# Patient Record
Sex: Female | Born: 1935 | Race: Black or African American | Hispanic: No | Smoking: Never smoker
Health system: Southern US, Community
[De-identification: ages and names within clinical notes are randomized; demographics above are authoritative.]

## PROBLEM LIST (undated history)

## (undated) DIAGNOSIS — F411 Generalized anxiety disorder: Secondary | ICD-10-CM

## (undated) DIAGNOSIS — M25569 Pain in unspecified knee: Secondary | ICD-10-CM

## (undated) DIAGNOSIS — M1710 Unilateral primary osteoarthritis, unspecified knee: Secondary | ICD-10-CM

## (undated) DIAGNOSIS — R634 Abnormal weight loss: Secondary | ICD-10-CM

## (undated) DIAGNOSIS — J309 Allergic rhinitis, unspecified: Secondary | ICD-10-CM

## (undated) DIAGNOSIS — M25469 Effusion, unspecified knee: Secondary | ICD-10-CM

## (undated) DIAGNOSIS — M79609 Pain in unspecified limb: Secondary | ICD-10-CM

## (undated) DIAGNOSIS — I10 Essential (primary) hypertension: Secondary | ICD-10-CM

## (undated) DIAGNOSIS — J13 Pneumonia due to Streptococcus pneumoniae: Secondary | ICD-10-CM

## (undated) DIAGNOSIS — K219 Gastro-esophageal reflux disease without esophagitis: Secondary | ICD-10-CM

## (undated) DIAGNOSIS — D649 Anemia, unspecified: Secondary | ICD-10-CM

## (undated) DIAGNOSIS — M545 Low back pain: Secondary | ICD-10-CM

## (undated) DIAGNOSIS — R1084 Generalized abdominal pain: Secondary | ICD-10-CM

## (undated) DIAGNOSIS — G629 Polyneuropathy, unspecified: Secondary | ICD-10-CM

## (undated) DIAGNOSIS — F0390 Unspecified dementia without behavioral disturbance: Principal | ICD-10-CM

## (undated) DIAGNOSIS — R21 Rash and other nonspecific skin eruption: Secondary | ICD-10-CM

## (undated) DIAGNOSIS — F329 Major depressive disorder, single episode, unspecified: Secondary | ICD-10-CM

## (undated) DIAGNOSIS — K5730 Diverticulosis of large intestine without perforation or abscess without bleeding: Secondary | ICD-10-CM

## (undated) DIAGNOSIS — Z853 Personal history of malignant neoplasm of breast: Secondary | ICD-10-CM

## (undated) DIAGNOSIS — Z86010 Personal history of colonic polyps: Secondary | ICD-10-CM

## (undated) DIAGNOSIS — I70219 Atherosclerosis of native arteries of extremities with intermittent claudication, unspecified extremity: Secondary | ICD-10-CM

## (undated) DIAGNOSIS — J45909 Unspecified asthma, uncomplicated: Secondary | ICD-10-CM

## (undated) DIAGNOSIS — E785 Hyperlipidemia, unspecified: Secondary | ICD-10-CM

## (undated) DIAGNOSIS — N959 Unspecified menopausal and perimenopausal disorder: Secondary | ICD-10-CM

## (undated) HISTORY — DX: Pain in unspecified knee: M25.569

## (undated) HISTORY — DX: Unilateral primary osteoarthritis, unspecified knee: M17.10

## (undated) HISTORY — DX: Generalized abdominal pain: R10.84

## (undated) HISTORY — DX: Effusion, unspecified knee: M25.469

## (undated) HISTORY — DX: Atherosclerosis of native arteries of extremities with intermittent claudication, unspecified extremity: I70.219

## (undated) HISTORY — PX: TUBAL LIGATION: SHX77

## (undated) HISTORY — DX: Unspecified asthma, uncomplicated: J45.909

## (undated) HISTORY — DX: Low back pain: M54.5

## (undated) HISTORY — DX: Gastro-esophageal reflux disease without esophagitis: K21.9

## (undated) HISTORY — DX: Pneumonia due to Streptococcus pneumoniae: J13

## (undated) HISTORY — DX: Essential (primary) hypertension: I10

## (undated) HISTORY — DX: Diverticulosis of large intestine without perforation or abscess without bleeding: K57.30

## (undated) HISTORY — DX: Anemia, unspecified: D64.9

## (undated) HISTORY — DX: Rash and other nonspecific skin eruption: R21

## (undated) HISTORY — DX: Polyneuropathy, unspecified: G62.9

## (undated) HISTORY — DX: Personal history of colonic polyps: Z86.010

## (undated) HISTORY — DX: Abnormal weight loss: R63.4

## (undated) HISTORY — DX: Major depressive disorder, single episode, unspecified: F32.9

## (undated) HISTORY — DX: Allergic rhinitis, unspecified: J30.9

## (undated) HISTORY — DX: Unspecified dementia without behavioral disturbance: F03.90

## (undated) HISTORY — DX: Pain in unspecified limb: M79.609

## (undated) HISTORY — DX: Generalized anxiety disorder: F41.1

## (undated) HISTORY — DX: Unspecified menopausal and perimenopausal disorder: N95.9

## (undated) HISTORY — DX: Hyperlipidemia, unspecified: E78.5

## (undated) HISTORY — DX: Personal history of malignant neoplasm of breast: Z85.3

---

## 1964-09-23 HISTORY — PX: ABDOMINAL HYSTERECTOMY: SHX81

## 1998-06-29 ENCOUNTER — Emergency Department (HOSPITAL_COMMUNITY): Admission: EM | Admit: 1998-06-29 | Discharge: 1998-06-30 | Payer: Self-pay | Admitting: Emergency Medicine

## 1998-07-08 ENCOUNTER — Emergency Department (HOSPITAL_COMMUNITY): Admission: EM | Admit: 1998-07-08 | Discharge: 1998-07-08 | Payer: Self-pay | Admitting: Emergency Medicine

## 2000-02-19 ENCOUNTER — Ambulatory Visit (HOSPITAL_COMMUNITY): Admission: RE | Admit: 2000-02-19 | Discharge: 2000-02-19 | Payer: Self-pay | Admitting: Internal Medicine

## 2000-02-19 ENCOUNTER — Encounter: Payer: Self-pay | Admitting: Internal Medicine

## 2001-01-17 ENCOUNTER — Encounter: Payer: Self-pay | Admitting: Emergency Medicine

## 2001-01-17 ENCOUNTER — Emergency Department (HOSPITAL_COMMUNITY): Admission: EM | Admit: 2001-01-17 | Discharge: 2001-01-17 | Payer: Self-pay | Admitting: Emergency Medicine

## 2001-09-30 LAB — HM COLONOSCOPY

## 2002-08-12 ENCOUNTER — Other Ambulatory Visit: Admission: RE | Admit: 2002-08-12 | Discharge: 2002-08-12 | Payer: Self-pay | Admitting: Surgery

## 2002-09-08 ENCOUNTER — Ambulatory Visit: Admission: RE | Admit: 2002-09-08 | Discharge: 2002-10-28 | Payer: Self-pay | Admitting: Radiation Oncology

## 2004-08-07 ENCOUNTER — Ambulatory Visit: Payer: Self-pay | Admitting: Internal Medicine

## 2004-08-21 ENCOUNTER — Ambulatory Visit: Payer: Self-pay | Admitting: Internal Medicine

## 2004-09-06 ENCOUNTER — Ambulatory Visit: Admission: RE | Admit: 2004-09-06 | Discharge: 2004-09-06 | Payer: Self-pay | Admitting: Radiation Oncology

## 2004-09-20 ENCOUNTER — Ambulatory Visit: Admission: RE | Admit: 2004-09-20 | Discharge: 2004-09-20 | Payer: Self-pay | Admitting: Radiation Oncology

## 2004-10-26 ENCOUNTER — Ambulatory Visit: Payer: Self-pay | Admitting: Oncology

## 2005-07-19 ENCOUNTER — Ambulatory Visit: Payer: Self-pay | Admitting: Internal Medicine

## 2005-11-07 ENCOUNTER — Ambulatory Visit: Payer: Self-pay | Admitting: Oncology

## 2006-03-25 ENCOUNTER — Ambulatory Visit: Payer: Self-pay | Admitting: Internal Medicine

## 2006-04-03 ENCOUNTER — Ambulatory Visit: Payer: Self-pay | Admitting: Internal Medicine

## 2006-06-27 ENCOUNTER — Ambulatory Visit: Payer: Self-pay | Admitting: Internal Medicine

## 2006-10-28 ENCOUNTER — Ambulatory Visit: Payer: Self-pay | Admitting: Oncology

## 2006-10-31 LAB — CBC WITH DIFFERENTIAL/PLATELET
BASO%: 0.1 % (ref 0.0–2.0)
Basophils Absolute: 0 10*3/uL (ref 0.0–0.1)
EOS%: 3.1 % (ref 0.0–7.0)
HGB: 11.4 g/dL — ABNORMAL LOW (ref 11.6–15.9)
MCH: 31.6 pg (ref 26.0–34.0)
MCHC: 35 g/dL (ref 32.0–36.0)
MCV: 90.3 fL (ref 81.0–101.0)
MONO%: 10.3 % (ref 0.0–13.0)
RBC: 3.62 10*6/uL — ABNORMAL LOW (ref 3.70–5.32)
RDW: 12.9 % (ref 11.3–14.5)
lymph#: 1.1 10*3/uL (ref 0.9–3.3)

## 2006-10-31 LAB — COMPREHENSIVE METABOLIC PANEL
ALT: 13 U/L (ref 0–35)
AST: 20 U/L (ref 0–37)
Albumin: 4.3 g/dL (ref 3.5–5.2)
Alkaline Phosphatase: 57 U/L (ref 39–117)
BUN: 25 mg/dL — ABNORMAL HIGH (ref 6–23)
Potassium: 4.2 mEq/L (ref 3.5–5.3)

## 2006-11-07 ENCOUNTER — Ambulatory Visit (HOSPITAL_COMMUNITY): Admission: RE | Admit: 2006-11-07 | Discharge: 2006-11-07 | Payer: Self-pay | Admitting: Oncology

## 2007-01-07 ENCOUNTER — Ambulatory Visit: Payer: Self-pay | Admitting: Internal Medicine

## 2007-04-10 ENCOUNTER — Ambulatory Visit: Payer: Self-pay | Admitting: Internal Medicine

## 2007-04-10 LAB — CONVERTED CEMR LAB
Alkaline Phosphatase: 62 units/L (ref 39–117)
Basophils Relative: 0.4 % (ref 0.0–1.0)
Bilirubin Urine: NEGATIVE
Bilirubin, Direct: 0.1 mg/dL (ref 0.0–0.3)
CO2: 29 meq/L (ref 19–32)
Creatinine, Ser: 1.2 mg/dL (ref 0.4–1.2)
Crystals: NEGATIVE
Eosinophils Absolute: 0.1 10*3/uL (ref 0.0–0.6)
Eosinophils Relative: 1.6 % (ref 0.0–5.0)
Glucose, Bld: 97 mg/dL (ref 70–99)
HDL: 66.8 mg/dL (ref >39.0)
Hemoglobin, Urine: NEGATIVE
Hemoglobin: 12 g/dL (ref 12.0–15.0)
Lymphocytes Relative: 17.9 % (ref 12.0–46.0)
MCV: 90.7 fL (ref 78.0–100.0)
Monocytes Absolute: 0.8 10*3/uL — ABNORMAL HIGH (ref 0.2–0.7)
Monocytes Relative: 12.1 % — ABNORMAL HIGH (ref 3.0–11.0)
Mucus, UA: NEGATIVE
Neutro Abs: 4.3 10*3/uL (ref 1.4–7.7)
Nitrite: NEGATIVE
Potassium: 4.6 meq/L (ref 3.5–5.1)
Sodium: 134 meq/L — ABNORMAL LOW (ref 135–145)
TSH: 1.1 microintl units/mL (ref 0.35–5.50)
Total Bilirubin: 1.1 mg/dL (ref 0.3–1.2)
Total Protein: 7.5 g/dL (ref 6.0–8.3)
Triglycerides: 46 mg/dL (ref 0–149)
Urine Glucose: NEGATIVE mg/dL
Urobilinogen, UA: 0.2 (ref 0.0–1.0)
VLDL: 9 mg/dL (ref 0–40)

## 2007-04-14 ENCOUNTER — Ambulatory Visit: Payer: Self-pay | Admitting: Internal Medicine

## 2007-04-16 DIAGNOSIS — M545 Low back pain, unspecified: Secondary | ICD-10-CM

## 2007-04-16 DIAGNOSIS — Z853 Personal history of malignant neoplasm of breast: Secondary | ICD-10-CM

## 2007-04-16 DIAGNOSIS — D62 Acute posthemorrhagic anemia: Secondary | ICD-10-CM

## 2007-04-16 DIAGNOSIS — F411 Generalized anxiety disorder: Secondary | ICD-10-CM

## 2007-04-16 DIAGNOSIS — D649 Anemia, unspecified: Secondary | ICD-10-CM

## 2007-04-16 DIAGNOSIS — K219 Gastro-esophageal reflux disease without esophagitis: Secondary | ICD-10-CM | POA: Insufficient documentation

## 2007-04-16 DIAGNOSIS — E785 Hyperlipidemia, unspecified: Secondary | ICD-10-CM

## 2007-04-16 DIAGNOSIS — I10 Essential (primary) hypertension: Secondary | ICD-10-CM

## 2007-04-16 HISTORY — DX: Anemia, unspecified: D64.9

## 2007-04-16 HISTORY — DX: Personal history of malignant neoplasm of breast: Z85.3

## 2007-04-16 HISTORY — DX: Low back pain, unspecified: M54.50

## 2007-04-16 HISTORY — DX: Gastro-esophageal reflux disease without esophagitis: K21.9

## 2007-04-16 HISTORY — DX: Hyperlipidemia, unspecified: E78.5

## 2007-04-16 HISTORY — DX: Generalized anxiety disorder: F41.1

## 2007-04-16 HISTORY — DX: Essential (primary) hypertension: I10

## 2007-05-13 ENCOUNTER — Ambulatory Visit: Payer: Self-pay | Admitting: Oncology

## 2007-05-15 LAB — CBC WITH DIFFERENTIAL/PLATELET
Basophils Absolute: 0 10*3/uL (ref 0.0–0.1)
Eosinophils Absolute: 0.2 10*3/uL (ref 0.0–0.5)
HGB: 11.4 g/dL — ABNORMAL LOW (ref 11.6–15.9)
LYMPH%: 20.1 % (ref 14.0–48.0)
MCH: 31.6 pg (ref 26.0–34.0)
MCV: 90.2 fL (ref 81.0–101.0)
MONO%: 10.3 % (ref 0.0–13.0)
NEUT#: 3.8 10*3/uL (ref 1.5–6.5)
Platelets: 250 10*3/uL (ref 145–400)
RBC: 3.61 10*6/uL — ABNORMAL LOW (ref 3.70–5.32)

## 2007-05-16 LAB — IRON AND TIBC
%SAT: 18 % — ABNORMAL LOW (ref 20–55)
TIBC: 309 ug/dL (ref 250–470)
UIBC: 254 ug/dL

## 2007-05-16 LAB — COMPREHENSIVE METABOLIC PANEL
Alkaline Phosphatase: 52 U/L (ref 39–117)
BUN: 19 mg/dL (ref 6–23)
Glucose, Bld: 89 mg/dL (ref 70–99)
Total Bilirubin: 0.6 mg/dL (ref 0.3–1.2)

## 2007-06-27 ENCOUNTER — Ambulatory Visit: Payer: Self-pay | Admitting: Internal Medicine

## 2007-08-25 ENCOUNTER — Encounter: Payer: Self-pay | Admitting: Internal Medicine

## 2007-09-03 ENCOUNTER — Ambulatory Visit: Payer: Self-pay | Admitting: Internal Medicine

## 2007-09-03 DIAGNOSIS — Z860100 Personal history of colon polyps, unspecified: Secondary | ICD-10-CM | POA: Insufficient documentation

## 2007-09-03 DIAGNOSIS — F3289 Other specified depressive episodes: Secondary | ICD-10-CM

## 2007-09-03 DIAGNOSIS — R1084 Generalized abdominal pain: Secondary | ICD-10-CM

## 2007-09-03 DIAGNOSIS — J45909 Unspecified asthma, uncomplicated: Secondary | ICD-10-CM

## 2007-09-03 DIAGNOSIS — K5730 Diverticulosis of large intestine without perforation or abscess without bleeding: Secondary | ICD-10-CM | POA: Insufficient documentation

## 2007-09-03 DIAGNOSIS — F329 Major depressive disorder, single episode, unspecified: Secondary | ICD-10-CM

## 2007-09-03 HISTORY — DX: Other specified depressive episodes: F32.89

## 2007-09-03 HISTORY — DX: Diverticulosis of large intestine without perforation or abscess without bleeding: K57.30

## 2007-09-03 HISTORY — DX: Personal history of colonic polyps: Z86.010

## 2007-09-03 HISTORY — DX: Unspecified asthma, uncomplicated: J45.909

## 2007-09-03 HISTORY — DX: Generalized abdominal pain: R10.84

## 2007-09-03 HISTORY — DX: Major depressive disorder, single episode, unspecified: F32.9

## 2007-12-30 ENCOUNTER — Ambulatory Visit: Payer: Self-pay | Admitting: Internal Medicine

## 2008-01-05 LAB — CONVERTED CEMR LAB
Alkaline Phosphatase: 49 units/L (ref 39–117)
Bilirubin Urine: NEGATIVE
Bilirubin, Direct: 0.1 mg/dL (ref 0.0–0.3)
Eosinophils Absolute: 0.2 10*3/uL (ref 0.0–0.7)
GFR calc Af Amer: 55 mL/min
GFR calc non Af Amer: 45 mL/min
Glucose, Bld: 97 mg/dL (ref 70–99)
HCT: 33.7 % — ABNORMAL LOW (ref 36.0–46.0)
Lipase: 40 units/L (ref 11.0–59.0)
Monocytes Absolute: 0.7 10*3/uL (ref 0.1–1.0)
Monocytes Relative: 12.7 % — ABNORMAL HIGH (ref 3.0–12.0)
Nitrite: NEGATIVE
Platelets: 240 10*3/uL (ref 150–400)
Potassium: 4.8 meq/L (ref 3.5–5.1)
RDW: 12.2 % (ref 11.5–14.6)
Sodium: 139 meq/L (ref 135–145)
Total Bilirubin: 0.5 mg/dL (ref 0.3–1.2)
Total Protein, Urine: NEGATIVE mg/dL

## 2008-01-07 ENCOUNTER — Encounter: Admission: RE | Admit: 2008-01-07 | Discharge: 2008-01-07 | Payer: Self-pay | Admitting: Internal Medicine

## 2008-03-22 ENCOUNTER — Emergency Department (HOSPITAL_COMMUNITY): Admission: EM | Admit: 2008-03-22 | Discharge: 2008-03-22 | Payer: Self-pay | Admitting: Emergency Medicine

## 2008-04-18 ENCOUNTER — Ambulatory Visit: Payer: Self-pay | Admitting: Internal Medicine

## 2008-04-18 DIAGNOSIS — N959 Unspecified menopausal and perimenopausal disorder: Secondary | ICD-10-CM

## 2008-04-18 HISTORY — DX: Unspecified menopausal and perimenopausal disorder: N95.9

## 2008-04-20 ENCOUNTER — Encounter: Payer: Self-pay | Admitting: Internal Medicine

## 2008-04-20 ENCOUNTER — Ambulatory Visit: Payer: Self-pay | Admitting: Internal Medicine

## 2008-04-25 ENCOUNTER — Telehealth (INDEPENDENT_AMBULATORY_CARE_PROVIDER_SITE_OTHER): Payer: Self-pay | Admitting: *Deleted

## 2008-05-18 ENCOUNTER — Ambulatory Visit: Payer: Self-pay | Admitting: Oncology

## 2008-05-20 ENCOUNTER — Encounter: Payer: Self-pay | Admitting: Internal Medicine

## 2008-06-06 ENCOUNTER — Encounter: Payer: Self-pay | Admitting: Internal Medicine

## 2008-07-05 ENCOUNTER — Ambulatory Visit: Payer: Self-pay | Admitting: Internal Medicine

## 2008-07-25 ENCOUNTER — Ambulatory Visit: Payer: Self-pay | Admitting: Internal Medicine

## 2008-07-25 DIAGNOSIS — R21 Rash and other nonspecific skin eruption: Secondary | ICD-10-CM

## 2008-07-25 DIAGNOSIS — R634 Abnormal weight loss: Secondary | ICD-10-CM | POA: Insufficient documentation

## 2008-07-25 HISTORY — DX: Rash and other nonspecific skin eruption: R21

## 2008-07-25 HISTORY — DX: Abnormal weight loss: R63.4

## 2008-08-16 ENCOUNTER — Telehealth (INDEPENDENT_AMBULATORY_CARE_PROVIDER_SITE_OTHER): Payer: Self-pay | Admitting: *Deleted

## 2008-08-30 ENCOUNTER — Encounter: Payer: Self-pay | Admitting: Internal Medicine

## 2008-11-22 ENCOUNTER — Ambulatory Visit: Payer: Self-pay | Admitting: Internal Medicine

## 2008-11-22 LAB — CONVERTED CEMR LAB
AST: 27 units/L (ref 0–37)
Albumin: 3.9 g/dL (ref 3.5–5.2)
Basophils Absolute: 0.1 10*3/uL (ref 0.0–0.1)
Basophils Relative: 0.8 % (ref 0.0–3.0)
Calcium: 9.3 mg/dL (ref 8.4–10.5)
Chloride: 101 meq/L (ref 96–112)
Cholesterol: 207 mg/dL (ref 0–200)
Creatinine, Ser: 1 mg/dL (ref 0.4–1.2)
Crystals: NEGATIVE
Direct LDL: 125.9 mg/dL
Eosinophils Absolute: 0.1 10*3/uL (ref 0.0–0.7)
GFR calc Af Amer: 67 mL/min
GFR calc non Af Amer: 56 mL/min
HCT: 34.7 % — ABNORMAL LOW (ref 36.0–46.0)
HDL: 62.2 mg/dL (ref >39.0)
Ketones, ur: NEGATIVE mg/dL
MCHC: 33.8 g/dL (ref 30.0–36.0)
MCV: 90.5 fL (ref 78.0–100.0)
Monocytes Absolute: 0.6 10*3/uL (ref 0.1–1.0)
Neutrophils Relative %: 76.9 % (ref 43.0–77.0)
Platelets: 216 10*3/uL (ref 150–400)
RBC: 3.84 M/uL — ABNORMAL LOW (ref 3.87–5.11)
TSH: 1.55 microintl units/mL (ref 0.35–5.50)
Total Bilirubin: 0.9 mg/dL (ref 0.3–1.2)
Urine Glucose: NEGATIVE mg/dL
Urobilinogen, UA: 0.2 (ref 0.0–1.0)
VLDL: 10 mg/dL (ref 0–40)

## 2008-11-26 ENCOUNTER — Telehealth: Payer: Self-pay | Admitting: Internal Medicine

## 2008-11-27 ENCOUNTER — Inpatient Hospital Stay (HOSPITAL_COMMUNITY): Admission: EM | Admit: 2008-11-27 | Discharge: 2008-11-29 | Payer: Self-pay | Admitting: Emergency Medicine

## 2008-11-27 ENCOUNTER — Ambulatory Visit: Payer: Self-pay | Admitting: Internal Medicine

## 2008-11-30 ENCOUNTER — Telehealth: Payer: Self-pay | Admitting: Internal Medicine

## 2008-12-07 ENCOUNTER — Ambulatory Visit: Payer: Self-pay | Admitting: Internal Medicine

## 2008-12-07 DIAGNOSIS — J13 Pneumonia due to Streptococcus pneumoniae: Secondary | ICD-10-CM

## 2008-12-07 HISTORY — DX: Pneumonia due to Streptococcus pneumoniae: J13

## 2008-12-12 ENCOUNTER — Encounter: Payer: Self-pay | Admitting: Internal Medicine

## 2008-12-14 ENCOUNTER — Encounter: Payer: Self-pay | Admitting: Internal Medicine

## 2008-12-15 ENCOUNTER — Ambulatory Visit: Payer: Self-pay | Admitting: Internal Medicine

## 2008-12-16 ENCOUNTER — Encounter: Payer: Self-pay | Admitting: Internal Medicine

## 2009-01-04 ENCOUNTER — Ambulatory Visit: Payer: Self-pay | Admitting: Internal Medicine

## 2009-01-04 LAB — CONVERTED CEMR LAB
Basophils Relative: 1 % (ref 0.0–3.0)
Eosinophils Relative: 6.5 % — ABNORMAL HIGH (ref 0.0–5.0)
Hemoglobin: 11.1 g/dL — ABNORMAL LOW (ref 12.0–15.0)
Lymphocytes Relative: 22.3 % (ref 12.0–46.0)
Monocytes Relative: 10.6 % (ref 3.0–12.0)
Neutro Abs: 3.3 10*3/uL (ref 1.4–7.7)
Neutrophils Relative %: 59.6 % (ref 43.0–77.0)
RBC: 3.54 M/uL — ABNORMAL LOW (ref 3.87–5.11)
Saturation Ratios: 15.2 % — ABNORMAL LOW (ref 20.0–50.0)
Transferrin: 216.4 mg/dL (ref 212.0–360.0)
Vitamin B-12: 411 pg/mL (ref 211–911)
WBC: 5.7 10*3/uL (ref 4.5–10.5)

## 2009-02-16 ENCOUNTER — Encounter (INDEPENDENT_AMBULATORY_CARE_PROVIDER_SITE_OTHER): Payer: Self-pay | Admitting: *Deleted

## 2009-02-27 ENCOUNTER — Ambulatory Visit: Payer: Self-pay | Admitting: Internal Medicine

## 2009-02-27 DIAGNOSIS — M25469 Effusion, unspecified knee: Secondary | ICD-10-CM

## 2009-02-27 DIAGNOSIS — J309 Allergic rhinitis, unspecified: Secondary | ICD-10-CM

## 2009-02-27 HISTORY — DX: Effusion, unspecified knee: M25.469

## 2009-02-27 HISTORY — DX: Allergic rhinitis, unspecified: J30.9

## 2009-04-04 ENCOUNTER — Telehealth: Payer: Self-pay | Admitting: Internal Medicine

## 2009-06-02 ENCOUNTER — Ambulatory Visit: Payer: Self-pay | Admitting: Oncology

## 2009-06-06 ENCOUNTER — Encounter: Payer: Self-pay | Admitting: Internal Medicine

## 2009-06-06 LAB — CBC WITH DIFFERENTIAL/PLATELET
BASO%: 0.6 % (ref 0.0–2.0)
LYMPH%: 22.6 % (ref 14.0–49.7)
MCHC: 34.7 g/dL (ref 31.5–36.0)
MCV: 89.6 fL (ref 79.5–101.0)
MONO%: 10.9 % (ref 0.0–14.0)
NEUT#: 2.9 10*3/uL (ref 1.5–6.5)
Platelets: 216 10*3/uL (ref 145–400)
RBC: 3.88 10*6/uL (ref 3.70–5.45)
RDW: 13.1 % (ref 11.2–14.5)
WBC: 4.7 10*3/uL (ref 3.9–10.3)

## 2009-06-06 LAB — COMPREHENSIVE METABOLIC PANEL
ALT: 13 U/L (ref 0–35)
AST: 21 U/L (ref 0–37)
Albumin: 4.3 g/dL (ref 3.5–5.2)
Alkaline Phosphatase: 50 U/L (ref 39–117)
Potassium: 4.7 mEq/L (ref 3.5–5.3)
Sodium: 132 mEq/L — ABNORMAL LOW (ref 135–145)
Total Bilirubin: 0.4 mg/dL (ref 0.3–1.2)
Total Protein: 7.7 g/dL (ref 6.0–8.3)

## 2009-06-21 ENCOUNTER — Ambulatory Visit: Payer: Self-pay | Admitting: Internal Medicine

## 2009-07-17 ENCOUNTER — Encounter: Payer: Self-pay | Admitting: Internal Medicine

## 2009-07-24 ENCOUNTER — Encounter: Payer: Self-pay | Admitting: Internal Medicine

## 2009-07-25 ENCOUNTER — Encounter: Payer: Self-pay | Admitting: Internal Medicine

## 2009-07-27 ENCOUNTER — Encounter: Payer: Self-pay | Admitting: Internal Medicine

## 2009-07-31 ENCOUNTER — Telehealth: Payer: Self-pay | Admitting: Internal Medicine

## 2009-08-04 ENCOUNTER — Telehealth: Payer: Self-pay | Admitting: Internal Medicine

## 2009-08-04 ENCOUNTER — Encounter: Payer: Self-pay | Admitting: Internal Medicine

## 2009-08-08 ENCOUNTER — Telehealth (INDEPENDENT_AMBULATORY_CARE_PROVIDER_SITE_OTHER): Payer: Self-pay | Admitting: *Deleted

## 2009-08-09 ENCOUNTER — Ambulatory Visit: Payer: Self-pay

## 2009-08-09 ENCOUNTER — Encounter (HOSPITAL_COMMUNITY): Admission: RE | Admit: 2009-08-09 | Discharge: 2009-09-19 | Payer: Self-pay | Admitting: Internal Medicine

## 2009-08-09 ENCOUNTER — Ambulatory Visit: Payer: Self-pay | Admitting: Cardiology

## 2009-08-15 ENCOUNTER — Ambulatory Visit: Payer: Self-pay | Admitting: Oncology

## 2009-08-21 ENCOUNTER — Encounter: Payer: Self-pay | Admitting: Internal Medicine

## 2009-08-22 ENCOUNTER — Telehealth (INDEPENDENT_AMBULATORY_CARE_PROVIDER_SITE_OTHER): Payer: Self-pay | Admitting: *Deleted

## 2009-08-25 ENCOUNTER — Ambulatory Visit: Payer: Self-pay | Admitting: Internal Medicine

## 2009-08-27 LAB — CONVERTED CEMR LAB
ALT: 15 units/L (ref 0–35)
AST: 27 units/L (ref 0–37)
Alkaline Phosphatase: 50 units/L (ref 39–117)
BUN: 12 mg/dL (ref 6–23)
Basophils Relative: 0 % (ref 0.0–3.0)
Chloride: 103 meq/L (ref 96–112)
Eosinophils Absolute: 0.1 10*3/uL (ref 0.0–0.7)
Eosinophils Relative: 1.9 % (ref 0.0–5.0)
GFR calc non Af Amer: 75.9 mL/min (ref >60)
Ketones, ur: NEGATIVE mg/dL
Lymphocytes Relative: 24.6 % (ref 12.0–46.0)
MCHC: 34.2 g/dL (ref 30.0–36.0)
MCV: 93.4 fL (ref 78.0–100.0)
Monocytes Absolute: 0.5 10*3/uL (ref 0.1–1.0)
Neutrophils Relative %: 63.7 % (ref 43.0–77.0)
Platelets: 206 10*3/uL (ref 150.0–400.0)
Potassium: 4.2 meq/L (ref 3.5–5.1)
RBC: 3.65 M/uL — ABNORMAL LOW (ref 3.87–5.11)
Sodium: 140 meq/L (ref 135–145)
Specific Gravity, Urine: <=1.005 (ref 1.000–1.030)
Total Bilirubin: 0.5 mg/dL (ref 0.3–1.2)
Total Protein, Urine: NEGATIVE mg/dL
Urine Glucose: NEGATIVE mg/dL
WBC: 5 10*3/uL (ref 4.5–10.5)
pH: 6 (ref 5.0–8.0)

## 2009-08-29 ENCOUNTER — Encounter (INDEPENDENT_AMBULATORY_CARE_PROVIDER_SITE_OTHER): Payer: Self-pay | Admitting: Surgery

## 2009-08-29 ENCOUNTER — Ambulatory Visit (HOSPITAL_COMMUNITY): Admission: RE | Admit: 2009-08-29 | Discharge: 2009-08-30 | Payer: Self-pay | Admitting: Surgery

## 2009-09-04 ENCOUNTER — Encounter: Payer: Self-pay | Admitting: Internal Medicine

## 2009-09-12 ENCOUNTER — Encounter: Payer: Self-pay | Admitting: Internal Medicine

## 2009-09-23 HISTORY — PX: OTHER SURGICAL HISTORY: SHX169

## 2009-10-18 ENCOUNTER — Encounter: Payer: Self-pay | Admitting: Internal Medicine

## 2009-11-24 ENCOUNTER — Ambulatory Visit: Payer: Self-pay | Admitting: Internal Medicine

## 2009-11-24 DIAGNOSIS — IMO0002 Reserved for concepts with insufficient information to code with codable children: Secondary | ICD-10-CM

## 2009-11-24 HISTORY — DX: Reserved for concepts with insufficient information to code with codable children: IMO0002

## 2009-12-11 ENCOUNTER — Telehealth: Payer: Self-pay | Admitting: Internal Medicine

## 2010-01-04 ENCOUNTER — Ambulatory Visit: Payer: Self-pay | Admitting: Oncology

## 2010-01-08 ENCOUNTER — Encounter: Payer: Self-pay | Admitting: Internal Medicine

## 2010-01-08 LAB — COMPREHENSIVE METABOLIC PANEL
AST: 23 U/L (ref 0–37)
Albumin: 4.6 g/dL (ref 3.5–5.2)
BUN: 11 mg/dL (ref 6–23)
CO2: 24 mEq/L (ref 19–32)
Calcium: 9.5 mg/dL (ref 8.4–10.5)
Chloride: 102 mEq/L (ref 96–112)
Glucose, Bld: 96 mg/dL (ref 70–99)
Potassium: 3.8 mEq/L (ref 3.5–5.3)

## 2010-01-08 LAB — CBC WITH DIFFERENTIAL/PLATELET
Basophils Absolute: 0 10*3/uL (ref 0.0–0.1)
EOS%: 1.4 % (ref 0.0–7.0)
Eosinophils Absolute: 0.1 10*3/uL (ref 0.0–0.5)
HCT: 35.9 % (ref 34.8–46.6)
HGB: 12.1 g/dL (ref 11.6–15.9)
MONO#: 0.4 10*3/uL (ref 0.1–0.9)
NEUT#: 3.3 10*3/uL (ref 1.5–6.5)
RDW: 13.7 % (ref 11.2–14.5)
WBC: 4.8 10*3/uL (ref 3.9–10.3)
lymph#: 1.1 10*3/uL (ref 0.9–3.3)

## 2010-01-08 LAB — LACTATE DEHYDROGENASE: LDH: 234 U/L (ref 94–250)

## 2010-04-16 ENCOUNTER — Telehealth: Payer: Self-pay | Admitting: Internal Medicine

## 2010-06-01 ENCOUNTER — Ambulatory Visit: Payer: Self-pay | Admitting: Internal Medicine

## 2010-06-01 DIAGNOSIS — M25569 Pain in unspecified knee: Secondary | ICD-10-CM

## 2010-06-01 DIAGNOSIS — M79609 Pain in unspecified limb: Secondary | ICD-10-CM

## 2010-06-01 HISTORY — DX: Pain in unspecified knee: M25.569

## 2010-06-01 HISTORY — DX: Pain in unspecified limb: M79.609

## 2010-06-05 ENCOUNTER — Encounter: Payer: Self-pay | Admitting: Internal Medicine

## 2010-06-05 DIAGNOSIS — I70219 Atherosclerosis of native arteries of extremities with intermittent claudication, unspecified extremity: Secondary | ICD-10-CM | POA: Insufficient documentation

## 2010-06-05 HISTORY — DX: Atherosclerosis of native arteries of extremities with intermittent claudication, unspecified extremity: I70.219

## 2010-07-18 ENCOUNTER — Ambulatory Visit: Payer: Self-pay | Admitting: Oncology

## 2010-07-20 ENCOUNTER — Encounter: Payer: Self-pay | Admitting: Internal Medicine

## 2010-07-20 LAB — CBC WITH DIFFERENTIAL/PLATELET
Basophils Absolute: 0 10*3/uL (ref 0.0–0.1)
HCT: 32.1 % — ABNORMAL LOW (ref 34.8–46.6)
HGB: 10.8 g/dL — ABNORMAL LOW (ref 11.6–15.9)
LYMPH%: 33.3 % (ref 14.0–49.7)
MCH: 30.4 pg (ref 25.1–34.0)
MONO#: 0.5 10*3/uL (ref 0.1–0.9)
NEUT%: 53.9 % (ref 38.4–76.8)
Platelets: 186 10*3/uL (ref 145–400)
WBC: 5.2 10*3/uL (ref 3.9–10.3)
lymph#: 1.7 10*3/uL (ref 0.9–3.3)

## 2010-07-20 LAB — COMPREHENSIVE METABOLIC PANEL
Albumin: 4.3 g/dL (ref 3.5–5.2)
BUN: 15 mg/dL (ref 6–23)
Calcium: 9.7 mg/dL (ref 8.4–10.5)
Chloride: 99 mEq/L (ref 96–112)
Creatinine, Ser: 1 mg/dL (ref 0.40–1.20)
Glucose, Bld: 98 mg/dL (ref 70–99)
Potassium: 4.8 mEq/L (ref 3.5–5.3)

## 2010-07-30 ENCOUNTER — Encounter: Payer: Self-pay | Admitting: Internal Medicine

## 2010-07-30 ENCOUNTER — Ambulatory Visit: Payer: Self-pay | Admitting: Internal Medicine

## 2010-10-25 NOTE — Miscellaneous (Signed)
Summary: Orders Update  Clinical Lists Changes  Problems: Added new problem of ATHEROSLERO NATV ART EXTREM W/INTERMIT CLAUDICAT (ICD-440.21) Orders: Added new Test order of Arterial Duplex Lower Extremity (Arterial Duplex Low) - Signed 

## 2010-10-25 NOTE — Assessment & Plan Note (Signed)
Summary: 6 MO ROV AND BP CK/NWS  #   Vital Signs:  Patient profile:   77 year old female Height:      60 inches Weight:      91.50 pounds BMI:     17.93 O2 Sat:      98 % on Room air Temp:     98.5 degrees F oral Pulse rate:   72 / minute BP sitting:   132 / 50  (left arm) Cuff size:   regular  Vitals Entered By: Zella Ball Ewing CMA (AAMA) (June 01, 2010 1:30 PM)  O2 Flow:  Room air CC: 6 Month ROV/RE   CC:  6 Month ROV/RE.  History of Present Illness: here to f/u - overall doing well; Pt denies CP, worsening sob, doe, wheezing, orthopnea, pnd, worsening LE edema, palps, dizziness or syncope Pt denies new neuro symptoms such as headache, facial or extremity weakness   No fever, wt loss, night sweats, loss of appetite or other constitutional symptoms ,  But does have singificant bilat knee pain left mor then ritht, but not unstable, no recent falls, but needs pain med refills as she is out.  Also c/o recurrent left calf pain with excericse on occcasion for the past several months, but not clear if related to the knee pain.  No lower back pain, bowel or bladdr change,  Overall anxiety well controlled as well.  No worsening depression, or suicidal ideation.  No Panic.    Problems Prior to Update: 1)  Leg Pain, Left  (ICD-729.5) 2)  Knee Pain, Bilateral  (ICD-719.46) 3)  Osteoarthritis, Knee, Left  (ICD-715.96) 4)  Preoperative Examination  (ICD-V72.84) 5)  Preoperative Examination  (ICD-V72.84) 6)  Allergic Rhinitis  (ICD-477.9) 7)  Joint Effusion, Left Knee  (ICD-719.06) 8)  Pneumonia, Community Acquired, Pneumococcal  (ICD-481) 9)  Preventive Health Care  (ICD-V70.0) 10)  Weight Loss  (ICD-783.21) 11)  Rash-nonvesicular  (ICD-782.1) 12)  Menopausal Disorder  (ICD-627.9) 13)  Preventive Health Care  (ICD-V70.0) 14)  Family History Diabetes 1st Degree Relative  (ICD-V18.0) 15)  Colonic Polyps, Hx of  (ICD-V12.72) 16)  Diverticulosis, Colon  (ICD-562.10) 17)  Asthma   (ICD-493.90) 18)  Depression  (ICD-311) 19)  Abdominal Pain, Generalized  (ICD-789.07) 20)  Low Back Pain  (ICD-724.2) 21)  Hypertension  (ICD-401.9) 22)  Hyperlipidemia  (ICD-272.4) 23)  Gerd  (ICD-530.81) 24)  Breast Cancer, Hx of  (ICD-V10.3) 25)  Anxiety  (ICD-300.00) 26)  Anemia-nos  (ICD-285.9)  Medications Prior to Update: 1)  Ventolin Hfa 108 (90 Base) Mcg/act Aers (Albuterol Sulfate) .... 2 Puffs Qid As Needed 2)  Alprazolam 0.25 Mg Tabs (Alprazolam) .... Take 1 Tablet By Mouth Twice A Day As Needed 3)  Citalopram Hydrobromide 10 Mg Tabs (Citalopram Hydrobromide) .Marland Kitchen.. 1 Tablet By Mouth Once A Day 4)  Pravachol 20 Mg Tabs (Pravastatin Sodium) .... Take 1 Tablet By Mouth Once A Day 5)  Triamcinolone Acetonide 0.5 % Crea (Triamcinolone Acetonide) .... Apply 1 A Small Amount To Affected Area Twice A Day 6)  Benazepril Hcl 20 Mg Tabs (Benazepril Hcl) .Marland Kitchen.. 1po Once Daily 7)  Polyethylene Glycol 3350  Oral Powd (Polyethylene Glycol 3350) .... Use As Directed 8)  Ecotrin Low Strength 81 Mg  Tbec (Aspirin) .Marland Kitchen.. 1 By Mouth Qd 9)  Metronidazole 250 Mg  Tabs (Metronidazole) .Marland Kitchen.. 1 By Mouth Qid For 10 Days Only 10)  Alendronate Sodium 70 Mg  Tabs (Alendronate Sodium) .Marland Kitchen.. 1 By Mouth Each Week 11)  Omeprazole 20 Mg Cpdr (Omeprazole) .Marland Kitchen.. 1 By Mouth Two Times A Day 12)  Tylenol With Codeine #3 300-30 Mg Tabs (Acetaminophen-Codeine) .Marland Kitchen.. 1 By Mouth Q 6 Hrs As Needed Pain  Current Medications (verified): 1)  Ventolin Hfa 108 (90 Base) Mcg/act Aers (Albuterol Sulfate) .... 2 Puffs Qid As Needed 2)  Alprazolam 0.5 Mg Tabs (Alprazolam) .Marland Kitchen.. 1 By Mouth Two Times A Day As Needed 3)  Pravachol 20 Mg Tabs (Pravastatin Sodium) .... Take 1 Tablet By Mouth Once A Day 4)  Triamcinolone Acetonide 0.5 % Crea (Triamcinolone Acetonide) .... Apply 1 A Small Amount To Affected Area Twice A Day 5)  Benazepril Hcl 20 Mg Tabs (Benazepril Hcl) .Marland Kitchen.. 1po Once Daily 6)  Polyethylene Glycol 3350  Oral Powd  (Polyethylene Glycol 3350) .... Use As Directed 7)  Ecotrin Low Strength 81 Mg  Tbec (Aspirin) .Marland Kitchen.. 1 By Mouth Qd 8)  Alendronate Sodium 70 Mg  Tabs (Alendronate Sodium) .Marland Kitchen.. 1 By Mouth Each Week 9)  Omeprazole 20 Mg Cpdr (Omeprazole) .Marland Kitchen.. 1 By Mouth Two Times A Day 10)  Tylenol With Codeine #3 300-30 Mg Tabs (Acetaminophen-Codeine) .Marland Kitchen.. 1 By Mouth Q 6 Hrs As Needed Pain  Allergies (verified): 1)  ! Pcn 2)  ! Sulfa 3)  ! Norvasc  Past History:  Past Medical History: Last updated: 02/27/2009 Anemia-NOS Anxiety Breast cancer, hx of(Stage 1 ER positive-2003) GERD Hyperlipidemia Hypertension Low back pain Depression Asthma Diverticulosis, colon Colonic polyps, hx of Allergic rhinitis  Past Surgical History: Last updated: 09/03/2007 Hysterectomy-1966 Tubal ligation  Social History: Last updated: 11/24/2009 Never Smoked Alcohol use-no Drug use-no Retired  Risk Factors: Smoking Status: never (09/03/2007)  Review of Systems       all otherwise negative per pt -    Physical Exam  General:  alert and well-developed.   Head:  normocephalic and atraumatic.   Eyes:  vision grossly intact, pupils equal, and pupils round.   Ears:  R ear normal and L ear normal.   Nose:  no external deformity and no nasal discharge.   Mouth:  no gingival abnormalities.   Neck:  supple and no masses.   Lungs:  normal respiratory effort and normal breath sounds.   Heart:  normal rate and regular rhythm.   Msk:  bialt knee effusions, small left > right with crepitus and bony arthritc changes Pulses:  trace - 1+ dorsalis pedis bialt Extremities:  no edema, no erythema   calf nontender Neurologic:  cranial nerves II-XII intact and strength normal in all extremities.     Impression & Recommendations:  Problem # 1:  KNEE PAIN, BILATERAL (ICD-719.46)  Her updated medication list for this problem includes:    Ecotrin Low Strength 81 Mg Tbec (Aspirin) .Marland Kitchen... 1 by mouth qd    Tylenol With  Codeine #3 300-30 Mg Tabs (Acetaminophen-codeine) .Marland Kitchen... 1 by mouth q 6 hrs as needed pain left > right  - Continue all previous medications as before this visit   Problem # 2:  LEG PAIN, LEFT (ICD-729.5)  with excercise to calf   - to check LE art dopplers  Orders: Misc. Referral (Misc. Ref)  Problem # 3:  ANXIETY (ICD-300.00)  The following medications were removed from the medication list:    Citalopram Hydrobromide 10 Mg Tabs (Citalopram hydrobromide) .Marland Kitchen... 1 tablet by mouth once a day Her updated medication list for this problem includes:    Alprazolam 0.5 Mg Tabs (Alprazolam) .Marland Kitchen... 1 by mouth two times a day as needed stable  overall by hx and exam, ok to continue meds/tx as is   Problem # 4:  HYPERTENSION (ICD-401.9)  Her updated medication list for this problem includes:    Benazepril Hcl 20 Mg Tabs (Benazepril hcl) .Marland Kitchen... 1po once daily  BP today: 132/50 Prior BP: 150/70 (11/24/2009)  Labs Reviewed: K+: 4.2 (08/25/2009) Creat: : 0.9 (08/25/2009)   Chol: 207 (11/22/2008)   HDL: 62.2 (11/22/2008)   LDL: DEL (11/22/2008)   TG: 51 (11/22/2008) stable overall by hx and exam, ok to continue meds/tx as is   Complete Medication List: 1)  Ventolin Hfa 108 (90 Base) Mcg/act Aers (Albuterol sulfate) .... 2 puffs qid as needed 2)  Alprazolam 0.5 Mg Tabs (Alprazolam) .Marland Kitchen.. 1 by mouth two times a day as needed 3)  Pravachol 20 Mg Tabs (Pravastatin sodium) .... Take 1 tablet by mouth once a day 4)  Triamcinolone Acetonide 0.5 % Crea (Triamcinolone acetonide) .... Apply 1 a small amount to affected area twice a day 5)  Benazepril Hcl 20 Mg Tabs (Benazepril hcl) .Marland Kitchen.. 1po once daily 6)  Polyethylene Glycol 3350 Oral Powd (Polyethylene glycol 3350) .... Use as directed 7)  Ecotrin Low Strength 81 Mg Tbec (Aspirin) .Marland Kitchen.. 1 by mouth qd 8)  Alendronate Sodium 70 Mg Tabs (Alendronate sodium) .Marland Kitchen.. 1 by mouth each week 9)  Omeprazole 20 Mg Cpdr (Omeprazole) .Marland Kitchen.. 1 by mouth two times a day 10)   Tylenol With Codeine #3 300-30 Mg Tabs (Acetaminophen-codeine) .Marland Kitchen.. 1 by mouth q 6 hrs as needed pain  Other Orders: Flu Vaccine 56yrs + MEDICARE PATIENTS (Z6109) Administration Flu vaccine - MCR (U0454)  Patient Instructions: 1)  you had the flu shot today 2)  increase the alprazolam as prescribed 3)  Continue all previous medications as before this visit , including the tylenol with codeine for pain 4)  Please schedule a follow-up appointment in 6 months with CPX labs Prescriptions: ALPRAZOLAM 0.5 MG TABS (ALPRAZOLAM) 1 by mouth two times a day as needed  #60 x 5   Entered and Authorized by:   Corwin Levins MD   Signed by:   Corwin Levins MD on 06/01/2010   Method used:   Print then Give to Patient   RxID:   [id number] TYLENOL WITH CODEINE #3 300-30 MG TABS (ACETAMINOPHEN-CODEINE) 1 by mouth q 6 hrs as needed pain  #120 x 5   Entered and Authorized by:   Corwin Levins MD   Signed by:   Corwin Levins MD on 06/01/2010   Method used:   Print then Give to Patient   RxID:   [id number]     Flu Vaccine Consent Questions     Do you have a history of severe allergic reactions to this vaccine? no    Any prior history of allergic reactions to egg and/or gelatin? no    Do you have a sensitivity to the preservative Thimersol? no    Do you have a past history of Guillan-Barre Syndrome? no    Do you currently have an acute febrile illness? no    Have you ever had a severe reaction to latex? no    Vaccine information given and explained to patient? yes    Are you currently pregnant? no    Lot Number:[id number]   Exp Date:03/23/2011   Site Given  Left Deltoid IMlu

## 2010-10-25 NOTE — Progress Notes (Signed)
Summary: medication refill  Phone Note Refill Request Message from:  Fax from Pharmacy on April 16, 2010 1:20 PM  Refills Requested: Medication #1:  ALPRAZOLAM 0.25 MG TABS Take 1 tablet by mouth twice a day as needed   Dosage confirmed as above?Dosage Confirmed   Last Refilled: 11/22/2009   Notes: [facility] Initial call taken by: Robin Ewing CMA Duncan Dull),  April 16, 2010 1:20 PM  Follow-up for Phone Call        done hardcopy to LIM side B - dahlia  Follow-up by: Corwin Levins MD,  April 16, 2010 1:40 PM  Additional Follow-up for Phone Call Additional follow up Details #1::        Rx faxed to pharmacy Additional Follow-up by: Margaret Pyle, CMA,  April 16, 2010 2:28 PM    Prescriptions: ALPRAZOLAM 0.25 MG TABS (ALPRAZOLAM) Take 1 tablet by mouth twice a day as needed  # 60 x 3   Entered and Authorized by:   Corwin Levins MD   Signed by:   Corwin Levins MD on 04/16/2010   Method used:   Print then Give to Patient   RxID:   [id number]

## 2010-10-25 NOTE — Miscellaneous (Signed)
Summary: BONE DENSITY  Clinical Lists Changes  Orders: Added new Test order of T-Bone Densitometry (77080) - Signed Added new Test order of T-Lumbar Vertebral Assessment (77082) - Signed 

## 2010-10-25 NOTE — Letter (Signed)
Summary: Postop Mastectomy/[facility]  Postop Mastectomy/[facility]   Imported By: Sherian Rein 09/27/2009 07:21:56  _____________________________________________________________________  External Attachment:    Type:   Image     Comment:   External Document

## 2010-10-25 NOTE — Progress Notes (Signed)
Summary: Med Refill  Phone Note Refill Request  on December 11, 2009 11:13 AM  Refills Requested: Medication #1:  ALPRAZOLAM 0.25 MG TABS Take 1 tablet by mouth twice a day as needed   Dosage confirmed as above?Dosage Confirmed   Notes: [facility] [phone] Initial call taken by: Scharlene Gloss,  December 11, 2009 11:14 AM  Follow-up for Phone Call        faxed. Follow-up by: Lucious Groves,  December 11, 2009 2:10 PM    New/Updated Medications: ALPRAZOLAM 0.25 MG TABS (ALPRAZOLAM) Take 1 tablet by mouth twice a day as needed Prescriptions: ALPRAZOLAM 0.25 MG TABS (ALPRAZOLAM) Take 1 tablet by mouth twice a day as needed  #60 x 3   Entered and Authorized by:   Corwin Levins MD   Signed by:   Corwin Levins MD on 12/11/2009   Method used:   Print then Give to Patient   RxID:   [id number]  done hardcopy to LIM side B - dahlia  Corwin Levins MD  December 11, 2009 1:22 PM

## 2010-10-25 NOTE — Letter (Signed)
Summary: [facility]  [facility]   Imported By: Sherian Rein 08/07/2010 07:51:52  _____________________________________________________________________  External Attachment:    Type:   Image     Comment:   External Document

## 2010-10-25 NOTE — Assessment & Plan Note (Signed)
Summary: 3 MO ROV /NWS  #   Vital Signs:  Patient profile:   77 year old female Height:      59 inches Weight:      104.38 pounds O2 Sat:      99 % on Room air Temp:     97.8 degrees F oral Pulse rate:   88 / minute BP sitting:   150 / 70  (left arm) Cuff size:   regular  Vitals Entered ByZella Ball Ewing (November 24, 2009 1:47 PM)  O2 Flow:  Room air  CC: 3 Mo ROV/RE   CC:  3 Mo ROV/RE.  History of Present Illness: Tolerated mastectomy well, no XRT or chemo  Still taking iron supplement by mouth, no constipation but stools some dark  Forgot to take BP med today, but usally good complaicne and tolerability per pt.  Pt denies CP, sob, doe, wheezing, orthopnea, pnd, worsening LE edema, palps, dizziness or syncope  Pt denies new neuro symptoms such as headache, facial or extremity weakness   Preventive Screening-Counseling & Management      Drug Use:  no.    Problems Prior to Update: 1)  Osteoarthritis, Knee, Left  (ICD-715.96) 2)  Preoperative Examination  (ICD-V72.84) 3)  Preoperative Examination  (ICD-V72.84) 4)  Allergic Rhinitis  (ICD-477.9) 5)  Joint Effusion, Left Knee  (ICD-719.06) 6)  Pneumonia, Community Acquired, Pneumococcal  (ICD-481) 7)  Preventive Health Care  (ICD-V70.0) 8)  Weight Loss  (ICD-783.21) 9)  Rash-nonvesicular  (ICD-782.1) 10)  Menopausal Disorder  (ICD-627.9) 11)  Preventive Health Care  (ICD-V70.0) 12)  Family History Diabetes 1st Degree Relative  (ICD-V18.0) 13)  Colonic Polyps, Hx of  (ICD-V12.72) 14)  Diverticulosis, Colon  (ICD-562.10) 15)  Asthma  (ICD-493.90) 16)  Depression  (ICD-311) 17)  Abdominal Pain, Generalized  (ICD-789.07) 18)  Low Back Pain  (ICD-724.2) 19)  Hypertension  (ICD-401.9) 20)  Hyperlipidemia  (ICD-272.4) 21)  Gerd  (ICD-530.81) 22)  Breast Cancer, Hx of  (ICD-V10.3) 23)  Anxiety  (ICD-300.00) 24)  Anemia-nos  (ICD-285.9)  Medications Prior to Update: 1)  Ventolin Hfa 108 (90 Base) Mcg/act Aers (Albuterol  Sulfate) .... 2 Puffs Qid As Needed 2)  Alprazolam 0.25 Mg Tabs (Alprazolam) .... Take 1 Tablet By Mouth Twice A Day As Needed 3)  Citalopram Hydrobromide 10 Mg Tabs (Citalopram Hydrobromide) .Marland Kitchen.. 1 Tablet By Mouth Once A Day 4)  Pravachol 20 Mg Tabs (Pravastatin Sodium) .... Take 1 Tablet By Mouth Once A Day 5)  Triamcinolone Acetonide 0.5 % Crea (Triamcinolone Acetonide) .... Apply 1 A Small Amount To Affected Area Twice A Day 6)  Benazepril Hcl 20 Mg Tabs (Benazepril Hcl) .Marland Kitchen.. 1po Once Daily 7)  Polyethylene Glycol 3350  Oral Powd (Polyethylene Glycol 3350) .... Use As Directed 8)  Ecotrin Low Strength 81 Mg  Tbec (Aspirin) .Marland Kitchen.. 1 By Mouth Qd 9)  Metronidazole 250 Mg  Tabs (Metronidazole) .Marland Kitchen.. 1 By Mouth Qid For 10 Days Only 10)  Alendronate Sodium 70 Mg  Tabs (Alendronate Sodium) .Marland Kitchen.. 1 By Mouth Each Week 11)  Omeprazole 20 Mg Cpdr (Omeprazole) .Marland Kitchen.. 1 By Mouth Two Times A Day  Current Medications (verified): 1)  Ventolin Hfa 108 (90 Base) Mcg/act Aers (Albuterol Sulfate) .... 2 Puffs Qid As Needed 2)  Alprazolam 0.25 Mg Tabs (Alprazolam) .... Take 1 Tablet By Mouth Twice A Day As Needed 3)  Citalopram Hydrobromide 10 Mg Tabs (Citalopram Hydrobromide) .Marland Kitchen.. 1 Tablet By Mouth Once A Day 4)  Pravachol 20  Mg Tabs (Pravastatin Sodium) .... Take 1 Tablet By Mouth Once A Day 5)  Triamcinolone Acetonide 0.5 % Crea (Triamcinolone Acetonide) .... Apply 1 A Small Amount To Affected Area Twice A Day 6)  Benazepril Hcl 20 Mg Tabs (Benazepril Hcl) .Marland Kitchen.. 1po Once Daily 7)  Polyethylene Glycol 3350  Oral Powd (Polyethylene Glycol 3350) .... Use As Directed 8)  Ecotrin Low Strength 81 Mg  Tbec (Aspirin) .Marland Kitchen.. 1 By Mouth Qd 9)  Metronidazole 250 Mg  Tabs (Metronidazole) .Marland Kitchen.. 1 By Mouth Qid For 10 Days Only 10)  Alendronate Sodium 70 Mg  Tabs (Alendronate Sodium) .Marland Kitchen.. 1 By Mouth Each Week 11)  Omeprazole 20 Mg Cpdr (Omeprazole) .Marland Kitchen.. 1 By Mouth Two Times A Day 12)  Tylenol With Codeine #3 300-30 Mg Tabs  (Acetaminophen-Codeine) .Marland Kitchen.. 1 By Mouth Q 6 Hrs As Needed Pain  Allergies (verified): 1)  ! Pcn 2)  ! Sulfa 3)  ! Norvasc  Past History:  Past Medical History: Last updated: 02/27/2009 Anemia-NOS Anxiety Breast cancer, hx of(Stage 1 ER positive-2003) GERD Hyperlipidemia Hypertension Low back pain Depression Asthma Diverticulosis, colon Colonic polyps, hx of Allergic rhinitis  Past Surgical History: Last updated: 09/03/2007 Hysterectomy-1966 Tubal ligation  Family History: Last updated: 09/03/2007 Family History Diabetes 1st degree relative Family History Hypertension sister with breast cancer  Social History: Last updated: 11/24/2009 Never Smoked Alcohol use-no Drug use-no Retired  Risk Factors: Smoking Status: never (09/03/2007)  Social History: Reviewed history from 09/03/2007 and no changes required. Never Smoked Alcohol use-no Drug use-no Retired Drug Use:  no  Review of Systems  The patient denies anorexia, fever, vision loss, decreased hearing, hoarseness, chest pain, syncope, dyspnea on exertion, peripheral edema, prolonged cough, headaches, hemoptysis, abdominal pain, melena, hematochezia, severe indigestion/heartburn, hematuria, muscle weakness, suspicious skin lesions, transient blindness, difficulty walking, depression, unusual weight change, abnormal bleeding, enlarged lymph nodes, and angioedema.         all otherwise negative per pt   Physical Exam  General:  alert and underweight appearing.   Head:  normocephalic and atraumatic.   Eyes:  vision grossly intact, pupils equal, and pupils round.   Ears:  R ear normal and L ear normal.   Nose:  no external deformity and no nasal discharge.   Mouth:  no gingival abnormalities and pharynx pink and moist.   Neck:  supple and no masses.   Lungs:  normal respiratory effort and normal breath sounds.   Heart:  normal rate and regular rhythm.   Abdomen:  soft, non-tender, and normal bowel  sounds.   Msk:  no joint tenderness.  but has left knee small effusion and some decresaed ROM, bony changes Extremities:  no edema, no erythema  Neurologic:  cranial nerves II-XII intact and strength normal in all extremities.     Impression & Recommendations:  Problem # 1:  Preventive Health Care (ICD-V70.0) Overall doing well, age appropriate education and counseling updated and referral for appropriate preventive services done unless declined, immunizations up to date or declined, diet counseling done if overweight, urged to quit smoking if smokes , most recent labs reviewed and current ordered if appropriate, ecg reviewed or declined (interpretation per ECG scanned in the EMR if done); information regarding Medicare Prevention requirements given if appropriate   Problem # 2:  ANEMIA-NOS (ICD-285.9) most recent iron normal, ok to stop iron suppelment  Problem # 3:  OSTEOARTHRITIS, KNEE, LEFT (ICD-715.96)  Her updated medication list for this problem includes:    Ecotrin Low Strength 81  Mg Tbec (Aspirin) .Marland Kitchen... 1 by mouth qd    Tylenol With Codeine #3 300-30 Mg Tabs (Acetaminophen-codeine) .Marland Kitchen... 1 by mouth q 6 hrs as needed pain treat as above, f/u any worsening signs or symptoms , cont cane for stability  Problem # 4:  HYPERTENSION (ICD-401.9)  Her updated medication list for this problem includes:    Benazepril Hcl 20 Mg Tabs (Benazepril hcl) .Marland Kitchen... 1po once daily overall stable overall by hx and exam, ok to continue meds/tx as is , f/u BP at home and next visit  BP today: 150/70 Prior BP: 154/62 (08/25/2009)  Labs Reviewed: K+: 4.2 (08/25/2009) Creat: : 0.9 (08/25/2009)   Chol: 207 (11/22/2008)   HDL: 62.2 (11/22/2008)   LDL: DEL (11/22/2008)   TG: 51 (11/22/2008)  Complete Medication List: 1)  Ventolin Hfa 108 (90 Base) Mcg/act Aers (Albuterol sulfate) .... 2 puffs qid as needed 2)  Alprazolam 0.25 Mg Tabs (Alprazolam) .... Take 1 tablet by mouth twice a day as needed 3)   Citalopram Hydrobromide 10 Mg Tabs (Citalopram hydrobromide) .Marland Kitchen.. 1 tablet by mouth once a day 4)  Pravachol 20 Mg Tabs (Pravastatin sodium) .... Take 1 tablet by mouth once a day 5)  Triamcinolone Acetonide 0.5 % Crea (Triamcinolone acetonide) .... Apply 1 a small amount to affected area twice a day 6)  Benazepril Hcl 20 Mg Tabs (Benazepril hcl) .Marland Kitchen.. 1po once daily 7)  Polyethylene Glycol 3350 Oral Powd (Polyethylene glycol 3350) .... Use as directed 8)  Ecotrin Low Strength 81 Mg Tbec (Aspirin) .Marland Kitchen.. 1 by mouth qd 9)  Metronidazole 250 Mg Tabs (Metronidazole) .Marland Kitchen.. 1 by mouth qid for 10 days only 10)  Alendronate Sodium 70 Mg Tabs (Alendronate sodium) .Marland Kitchen.. 1 by mouth each week 11)  Omeprazole 20 Mg Cpdr (Omeprazole) .Marland Kitchen.. 1 by mouth two times a day 12)  Tylenol With Codeine #3 300-30 Mg Tabs (Acetaminophen-codeine) .Marland Kitchen.. 1 by mouth q 6 hrs as needed pain  Other Orders: Pneumococcal Vaccine (04540) Admin 1st Vaccine (98119)  Patient Instructions: 1)  please stop the iron supplement 2)  you had the pneumonia shot today 3)  Please take all new medications as prescribed - the pain medication for the knee 4)  Continue all previous medications as before this visit  5)  Please schedule a follow-up appointment in 6 months for Blood Pressure check Prescriptions: TYLENOL WITH CODEINE #3 300-30 MG TABS (ACETAMINOPHEN-CODEINE) 1 by mouth q 6 hrs as needed pain  #120 x 2   Entered and Authorized by:   Corwin Levins MD   Signed by:   Corwin Levins MD on 11/24/2009   Method used:   Print then Give to Patient   RxID:   [id number]    Immunizations Administered:  Pneumonia Vaccine:    Vaccine Type: Pneumovax    Site: left deltoid    Mfr: Merck    Dose: 0.5 ml    Route: IM    Given by: Zella Ball Ewing    Exp. Date: 05/07/2011    Lot #: [id number]    VIS given: 04/20/96 version given November 24, 2009.

## 2010-10-25 NOTE — Letter (Signed)
[facility]   Imported By: Sherian Rein 01/24/2010 09:20:23  _____________________________________________________________________  External Attachment:    Type:   Image     Comment:   External Document

## 2010-10-25 NOTE — Letter (Signed)
[facility]   Imported By: Lester [place] 10/05/2009 09:03:16  _____________________________________________________________________  External Attachment:    Type:   Image     Comment:   External Document

## 2010-12-05 ENCOUNTER — Encounter (INDEPENDENT_AMBULATORY_CARE_PROVIDER_SITE_OTHER): Payer: Medicare Other | Admitting: Internal Medicine

## 2010-12-05 ENCOUNTER — Encounter: Payer: Self-pay | Admitting: Internal Medicine

## 2010-12-05 ENCOUNTER — Other Ambulatory Visit: Payer: Medicare Other

## 2010-12-05 ENCOUNTER — Other Ambulatory Visit: Payer: Self-pay | Admitting: Internal Medicine

## 2010-12-05 DIAGNOSIS — Z79899 Other long term (current) drug therapy: Secondary | ICD-10-CM

## 2010-12-05 DIAGNOSIS — E785 Hyperlipidemia, unspecified: Secondary | ICD-10-CM

## 2010-12-05 DIAGNOSIS — Z0000 Encounter for general adult medical examination without abnormal findings: Secondary | ICD-10-CM

## 2010-12-05 LAB — HEPATIC FUNCTION PANEL
AST: 26 U/L (ref 0–37)
Alkaline Phosphatase: 59 U/L (ref 39–117)
Total Bilirubin: 0.8 mg/dL (ref 0.3–1.2)

## 2010-12-05 LAB — LDL CHOLESTEROL, DIRECT: Direct LDL: 150.5 mg/dL

## 2010-12-05 LAB — CBC WITH DIFFERENTIAL/PLATELET
Basophils Absolute: 0.1 10*3/uL (ref 0.0–0.1)
Eosinophils Relative: 1.5 % (ref 0.0–5.0)
Hemoglobin: 11.9 g/dL — ABNORMAL LOW (ref 12.0–15.0)
Lymphocytes Relative: 19.4 % (ref 12.0–46.0)
Monocytes Relative: 9.5 % (ref 3.0–12.0)
Neutro Abs: 4.3 10*3/uL (ref 1.4–7.7)
Platelets: 227 10*3/uL (ref 150.0–400.0)
RDW: 13.6 % (ref 11.5–14.6)
WBC: 6.2 10*3/uL (ref 4.5–10.5)

## 2010-12-05 LAB — BASIC METABOLIC PANEL
Calcium: 9.6 mg/dL (ref 8.4–10.5)
GFR: 78.7 mL/min (ref >60.00)
Glucose, Bld: 94 mg/dL (ref 70–99)
Sodium: 133 mEq/L — ABNORMAL LOW (ref 135–145)

## 2010-12-05 LAB — LIPID PANEL
Total CHOL/HDL Ratio: 3
VLDL: 9.8 mg/dL (ref 0.0–40.0)

## 2010-12-05 LAB — TSH: TSH: 0.89 u[IU]/mL (ref 0.35–5.50)

## 2010-12-11 NOTE — Assessment & Plan Note (Signed)
Summary: 6 MTH YEARLY  STC   Vital Signs:  Patient profile:   77 year old female Height:      60 inches Weight:      97.75 pounds BMI:     19.16 O2 Sat:      98 % on Room air Temp:     98.9 degrees F oral Pulse rate:   74 / minute BP sitting:   170 / 70  (left arm) Cuff size:   regular  Vitals Entered By: Zella Ball Ewing CMA Duncan Dull) (December 05, 2010 10:58 AM)  O2 Flow:  Room air  Preventive Care Screening  Bone Density:    Date:  08/03/2010    Next Due:  07/2012    Results:  abnormal std dev     decliens tetanus today  CC: Yearly/RE   CC:  Yearly/RE.  History of Present Illness: here to f/u ; overall doing ok;  Pt denies CP, worsening sob, doe, wheezing, orthopnea, pnd, worsening LE edema, palps, dizziness or syncope  Pt denies new neuro symptoms such as headache, facial or extremity weakness  Pt denies polydipsia, polyuria  Overall good compliance with meds, trying to follow low chol diet, wt stable,  spry for her age and gets some excercise daily.  No fever, wt loss, night sweats, loss of appetite or other constitutional symptoms  Overall good compliance with meds, and good tolerability.  Denies worsening depressive symptoms, suicidal ideation, or panic, though has osme onoging anxiety., though not worse recently.  Pt states good ability with ADL's, low fall risk, home safety reviewed and adequate, no significant change in hearing or vision, trying to follow lower chol diet, and occasionally active only with regular excercise.  Walks with walker at home, no recent falls.  Here with 4 pronged cane.     Problems Prior to Update: 1)  Atheroslero Eather Colas Art Extrem W/intermit Claudicat  (ICD-440.21) 2)  Leg Pain, Left  (ICD-729.5) 3)  Knee Pain, Bilateral  (ICD-719.46) 4)  Osteoarthritis, Knee, Left  (ICD-715.96) 5)  Preoperative Examination  (ICD-V72.84) 6)  Preoperative Examination  (ICD-V72.84) 7)  Allergic Rhinitis  (ICD-477.9) 8)  Joint Effusion, Left Knee  (ICD-719.06) 9)   Pneumonia, Community Acquired, Pneumococcal  (ICD-481) 10)  Preventive Health Care  (ICD-V70.0) 11)  Weight Loss  (ICD-783.21) 12)  Rash-nonvesicular  (ICD-782.1) 13)  Menopausal Disorder  (ICD-627.9) 14)  Preventive Health Care  (ICD-V70.0) 15)  Family History Diabetes 1st Degree Relative  (ICD-V18.0) 16)  Colonic Polyps, Hx of  (ICD-V12.72) 17)  Diverticulosis, Colon  (ICD-562.10) 18)  Asthma  (ICD-493.90) 19)  Depression  (ICD-311) 20)  Abdominal Pain, Generalized  (ICD-789.07) 21)  Low Back Pain  (ICD-724.2) 22)  Hypertension  (ICD-401.9) 23)  Hyperlipidemia  (ICD-272.4) 24)  Gerd  (ICD-530.81) 25)  Breast Cancer, Hx of  (ICD-V10.3) 26)  Anxiety  (ICD-300.00) 27)  Anemia-nos  (ICD-285.9)  Medications Prior to Update: 1)  Ventolin Hfa 108 (90 Base) Mcg/act Aers (Albuterol Sulfate) .... 2 Puffs Qid As Needed 2)  Alprazolam 0.5 Mg Tabs (Alprazolam) .Marland Kitchen.. 1 By Mouth Two Times A Day As Needed 3)  Pravachol 20 Mg Tabs (Pravastatin Sodium) .... Take 1 Tablet By Mouth Once A Day 4)  Triamcinolone Acetonide 0.5 % Crea (Triamcinolone Acetonide) .... Apply 1 A Small Amount To Affected Area Twice A Day 5)  Benazepril Hcl 20 Mg Tabs (Benazepril Hcl) .Marland Kitchen.. 1po Once Daily 6)  Polyethylene Glycol 3350  Oral Powd (Polyethylene Glycol 3350) .... Use As Directed 7)  Ecotrin Low Strength 81 Mg  Tbec (Aspirin) .Marland Kitchen.. 1 By Mouth Qd 8)  Alendronate Sodium 70 Mg  Tabs (Alendronate Sodium) .Marland Kitchen.. 1 By Mouth Each Week 9)  Omeprazole 20 Mg Cpdr (Omeprazole) .Marland Kitchen.. 1 By Mouth Two Times A Day 10)  Tylenol With Codeine #3 300-30 Mg Tabs (Acetaminophen-Codeine) .Marland Kitchen.. 1 By Mouth Q 6 Hrs As Needed Pain  Current Medications (verified): 1)  Ventolin Hfa 108 (90 Base) Mcg/act Aers (Albuterol Sulfate) .... 2 Puffs Qid As Needed 2)  Alprazolam 0.5 Mg Tabs (Alprazolam) .Marland Kitchen.. 1 By Mouth Two Times A Day As Needed 3)  Pravachol 20 Mg Tabs (Pravastatin Sodium) .... Take 1 Tablet By Mouth Once A Day 4)  Triamcinolone Acetonide  0.5 % Crea (Triamcinolone Acetonide) .... Apply 1 A Small Amount To Affected Area Twice A Day 5)  Benazepril Hcl 20 Mg Tabs (Benazepril Hcl) .Marland Kitchen.. 1po Once Daily 6)  Polyethylene Glycol 3350  Oral Powd (Polyethylene Glycol 3350) .... Use As Directed 7)  Ecotrin Low Strength 81 Mg  Tbec (Aspirin) .Marland Kitchen.. 1 By Mouth Qd 8)  Alendronate Sodium 70 Mg  Tabs (Alendronate Sodium) .Marland Kitchen.. 1 By Mouth Each Week 9)  Omeprazole 20 Mg Cpdr (Omeprazole) .Marland Kitchen.. 1 By Mouth Two Times A Day 10)  Tylenol With Codeine #3 300-30 Mg Tabs (Acetaminophen-Codeine) .Marland Kitchen.. 1 By Mouth Q 6 Hrs As Needed Pain 11)  Amlodipine Besylate 5 Mg Tabs (Amlodipine Besylate) .Marland Kitchen.. 1po Once Daily  Allergies (verified): 1)  ! Pcn 2)  ! Sulfa 3)  ! Norvasc  Past History:  Past Medical History: Last updated: 02/27/2009 Anemia-NOS Anxiety Breast cancer, hx of(Stage 1 ER positive-2003) GERD Hyperlipidemia Hypertension Low back pain Depression Asthma Diverticulosis, colon Colonic polyps, hx of Allergic rhinitis  Family History: Last updated: 09/03/2007 Family History Diabetes 1st degree relative Family History Hypertension sister with breast cancer  Social History: Last updated: 11/24/2009 Never Smoked Alcohol use-no Drug use-no Retired  Risk Factors: Smoking Status: never (09/03/2007)  Past Surgical History: Hysterectomy-1966 Tubal ligation s/p left mastectomy 2011  Review of Systems  The patient denies anorexia, fever, vision loss, decreased hearing, hoarseness, chest pain, syncope, dyspnea on exertion, peripheral edema, prolonged cough, headaches, hemoptysis, abdominal pain, melena, hematochezia, severe indigestion/heartburn, hematuria, muscle weakness, suspicious skin lesions, transient blindness, difficulty walking, depression, unusual weight change, abnormal bleeding, enlarged lymph nodes, and angioedema.         all otherwise negative per pt -    Physical Exam  General:  alert and well-developed.   Head:   normocephalic and atraumatic.   Eyes:  vision grossly intact, pupils equal, and pupils round.   Ears:  R ear normal and L ear normal.   Nose:  no external deformity and no nasal discharge.   Mouth:  no gingival abnormalities.  pharynx pink and moist.   Neck:  supple and no masses.   Lungs:  normal respiratory effort and normal breath sounds.   Heart:  normal rate and regular rhythm.   Abdomen:  soft, non-tender, and normal bowel sounds.   Msk:  bialt knee effusions, small left > right with crepitus and bony arthritc changes Extremities:  no edema, no erythema   calf nontender Neurologic:  cranial nerves II-XII intact and strength normal in all extremities.   Skin:  color normal and no rashes.   Psych:  not depressed appearing and slightly anxious.     Impression & Recommendations:  Problem # 1:  Preventive Health Care (ICD-V70.0)  Overall doing well, age appropriate education  and counseling updated, referral for preventive services and immunizations addressed, dietary counseling and smoking status adressed , most recent labs reviewed, ecg reviewed I have personally reviewed and have noted 1.The patient's medical and social history 2.Their use of alcohol, tobacco or illicit drugs 3.Their current medications and supplements 4. Functional ability including ADL's, fall risk, home safety risk, hearing & visual impairment  5.Diet and physical activities 6.Evidence for depression or mood disorders The patients weight, height, BMI  have been recorded in the chart I have made referrals, counseling and provided education to the patient based review of the above   Orders: TLB-BMP (Basic Metabolic Panel-BMET) (80048-METABOL) TLB-CBC Platelet - w/Differential (85025-CBCD) TLB-Hepatic/Liver Function Pnl (80076-HEPATIC) TLB-Lipid Panel (80061-LIPID) TLB-TSH (Thyroid Stimulating Hormone) (84443-TSH) TLB-Udip ONLY (81003-UDIP)  Problem # 2:  HYPERTENSION (ICD-401.9)  Her updated medication  list for this problem includes:    Benazepril Hcl 20 Mg Tabs (Benazepril hcl) .Marland Kitchen... 1po once daily    Amlodipine Besylate 5 Mg Tabs (Amlodipine besylate) .Marland Kitchen... 1po once daily to add the amlodipine for uncontrolled BP - treat as above, f/u any worsening signs or symptoms   BP today: 170/70 Prior BP: 132/50 (06/01/2010)  Labs Reviewed: K+: 4.2 (08/25/2009) Creat: : 0.9 (08/25/2009)   Chol: 207 (11/22/2008)   HDL: 62.2 (11/22/2008)   LDL: DEL (11/22/2008)   TG: 51 (11/22/2008)  Complete Medication List: 1)  Ventolin Hfa 108 (90 Base) Mcg/act Aers (Albuterol sulfate) .... 2 puffs qid as needed 2)  Alprazolam 0.5 Mg Tabs (Alprazolam) .Marland Kitchen.. 1 by mouth two times a day as needed 3)  Pravachol 20 Mg Tabs (Pravastatin sodium) .... Take 1 tablet by mouth once a day 4)  Triamcinolone Acetonide 0.5 % Crea (Triamcinolone acetonide) .... Apply 1 a small amount to affected area twice a day 5)  Benazepril Hcl 20 Mg Tabs (Benazepril hcl) .Marland Kitchen.. 1po once daily 6)  Polyethylene Glycol 3350 Oral Powd (Polyethylene glycol 3350) .... Use as directed 7)  Ecotrin Low Strength 81 Mg Tbec (Aspirin) .Marland Kitchen.. 1 by mouth qd 8)  Alendronate Sodium 70 Mg Tabs (Alendronate sodium) .Marland Kitchen.. 1 by mouth each week 9)  Omeprazole 20 Mg Cpdr (Omeprazole) .Marland Kitchen.. 1 by mouth two times a day 10)  Tylenol With Codeine #3 300-30 Mg Tabs (Acetaminophen-codeine) .Marland Kitchen.. 1 by mouth q 6 hrs as needed pain 11)  Amlodipine Besylate 5 Mg Tabs (Amlodipine besylate) .Marland Kitchen.. 1po once daily  Patient Instructions: 1)  Please go to the Lab in the basement for your blood and/or urine tests today 2)  Please call the number on the [facility] for results of your testing 3)  Please take all new medications as prescribed  - the new amlodipine 5 mg per day for the blood pressure 4)  Continue all previous medications as before this visit   5)  You are given all the refills today (the pain med and alprazolam are hardcopy, the rest were sent by computer) 6)  Please  schedule a follow-up appointment in 6 mo, or sooner if  needed 7)  Check your Blood Pressure regularly. If it is above 140/90: you should make an appointment. Prescriptions: AMLODIPINE BESYLATE 5 MG TABS (AMLODIPINE BESYLATE) 1po once daily  #90 x 3   Entered and Authorized by:   Corwin Levins MD   Signed by:   Corwin Levins MD on 12/05/2010   Method used:   Electronically to        [facility] [phone]* (retail)  [address], [place]  [address]       Ph: [phone] or [phone]       Fax: [phone]   RxID:   [id number] OMEPRAZOLE 20 MG CPDR (OMEPRAZOLE) 1 by mouth two times a day  #180 x 3   Entered and Authorized by:   Corwin Levins MD   Signed by:   Corwin Levins MD on 12/05/2010   Method used:   Electronically to        [facility] [phone]* (retail)       [address] [place]  [address]       Ph: [phone] or [phone]       Fax: [phone]   RxID:   [id number] ALENDRONATE SODIUM 70 MG  TABS (ALENDRONATE SODIUM) 1 by mouth each week  #12 x 3   Entered and Authorized by:   Corwin Levins MD   Signed by:   Corwin Levins MD on 12/05/2010   Method used:   [facility]ron[facility] to        [facility] [phone]* (retail)       [address] [place]  [address]       Ph: [phone] or [phone]       Fax: [phone]   RxID:   [id number] BENAZEPRIL HCL 20 MG TABS (BENAZEPRIL HCL) 1po once daily  #90 x 3   Entered and Authorized by:   Corwin Levins MD   Signed by:   Corwin Levins MD on 12/05/2010   Method used:   Electronically to        [facility] [phone]* (retail)       [address] [place]  [address]       Ph: [phone] or [phone]       Fax: [phone]   RxID:   [id number] TRIAMCINOLONE ACETONIDE 0.5 % CREA (TRIAMCINOLONE ACETONIDE) Apply 1 a small amount to affected area twice a day  #30gm x 1   Entered and Authorized by:   Corwin Levins MD   Signed by:   Corwin Levins MD on  12/05/2010   Method used:   Electronically to        [facility] [phone]* (retail)       [address] [place]  [address]       Ph: [phone] or [phone]       Fax: [phone]   RxID:   [id number] VENTOLIN HFA 108 (90 BASE) MCG/ACT AERS (ALBUTEROL SULFATE) 2 puffs qid as needed  #1 x 11   Entered and Authorized by:   Corwin Levins MD   Signed by:   Corwin Levins MD on 12/05/2010   Method used:   [facility]ronically to        [facility] [phone]* (retail)       [address] [place]  [address]       Ph: [phone] or [phone]       Fax: [phone]   RxID:   [id number] ALPRAZOLAM 0.5 MG TABS (ALPRAZOLAM) 1 by mouth two times a day as needed  #60 x 5   Entered and Authorized by:  Corwin Levins MD   Signed by:   Corwin Levins MD on 12/05/2010   Method used:   Print then Give to Patient   RxID:   [id number] TYLENOL WITH CODEINE #3 300-30 MG TABS (ACETAMINOPHEN-CODEINE) 1 by mouth q 6 hrs as needed pain  #120 x 5   Entered and Authorized by:   Corwin Levins MD   Signed by:   Corwin Levins MD on 12/05/2010   Method used:   Print then Give to Patient   RxID:   [id number]    Orders Added: 1)  TLB-BMP (Basic Metabolic Panel-BMET) [80048-METABOL] 2)  TLB-CBC Platelet - w/Differential [85025-CBCD] 3)  TLB-Hepatic/Liver Function Pnl [80076-HEPATIC] 4)  TLB-Lipid Panel [80061-LIPID] 5)  TLB-TSH (Thyroid Stimulating Hormone) [84443-TSH] 6)  TLB-Udip ONLY [81003-UDIP] 7)  Est. Patient 65& > [244[address]]

## 2010-12-15 ENCOUNTER — Other Ambulatory Visit: Payer: Self-pay | Admitting: Internal Medicine

## 2010-12-25 LAB — COMPREHENSIVE METABOLIC PANEL
ALT: 14 U/L (ref 0–35)
Alkaline Phosphatase: 52 U/L (ref 39–117)
Glucose, Bld: 87 mg/dL (ref 70–99)
Potassium: 3.9 mEq/L (ref 3.5–5.1)
Sodium: 138 mEq/L (ref 135–145)
Total Protein: 8.1 g/dL (ref 6.0–8.3)

## 2010-12-25 LAB — DIFFERENTIAL
Basophils Relative: 1 % (ref 0–1)
Eosinophils Absolute: 0.1 10*3/uL (ref 0.0–0.7)
Eosinophils Relative: 2 % (ref 0–5)
Monocytes Absolute: 0.4 10*3/uL (ref 0.1–1.0)
Monocytes Relative: 8 % (ref 3–12)
Neutrophils Relative %: 63 % (ref 43–77)

## 2010-12-25 LAB — URINALYSIS, ROUTINE W REFLEX MICROSCOPIC
Bilirubin Urine: NEGATIVE
Protein, ur: NEGATIVE mg/dL
Urobilinogen, UA: 1 mg/dL (ref 0.0–1.0)

## 2010-12-25 LAB — CBC
Platelets: 223 10*3/uL (ref 150–400)
WBC: 4.5 10*3/uL (ref 4.0–10.5)

## 2011-01-03 LAB — BASIC METABOLIC PANEL WITH GFR
BUN: 11 mg/dL (ref 6–23)
CO2: 27 meq/L (ref 19–32)
Calcium: 8.1 mg/dL — ABNORMAL LOW (ref 8.4–10.5)
Chloride: 101 meq/L (ref 96–112)
Creatinine, Ser: 0.82 mg/dL (ref 0.4–1.2)
GFR calc non Af Amer: >60 mL/min
Glucose, Bld: 119 mg/dL — ABNORMAL HIGH (ref 70–99)
Potassium: 3 meq/L — ABNORMAL LOW (ref 3.5–5.1)
Sodium: 133 meq/L — ABNORMAL LOW (ref 135–145)

## 2011-01-03 LAB — COMPREHENSIVE METABOLIC PANEL WITH GFR
ALT: 39 U/L — ABNORMAL HIGH (ref 0–35)
AST: 71 U/L — ABNORMAL HIGH (ref 0–37)
Albumin: 3.1 g/dL — ABNORMAL LOW (ref 3.5–5.2)
Alkaline Phosphatase: 58 U/L (ref 39–117)
BUN: 15 mg/dL (ref 6–23)
CO2: 25 meq/L (ref 19–32)
Calcium: 8.6 mg/dL (ref 8.4–10.5)
Chloride: 96 meq/L (ref 96–112)
Creatinine, Ser: 0.97 mg/dL (ref 0.4–1.2)
GFR calc non Af Amer: 54 mL/min — ABNORMAL LOW
Glucose, Bld: 149 mg/dL — ABNORMAL HIGH (ref 70–99)
Potassium: 2.9 meq/L — ABNORMAL LOW (ref 3.5–5.1)
Sodium: 133 meq/L — ABNORMAL LOW (ref 135–145)
Total Bilirubin: 0.7 mg/dL (ref 0.3–1.2)
Total Protein: 7 g/dL (ref 6.0–8.3)

## 2011-01-03 LAB — CBC
HCT: 28.5 % — ABNORMAL LOW (ref 36.0–46.0)
HCT: 31.8 % — ABNORMAL LOW (ref 36.0–46.0)
Hemoglobin: 10.6 g/dL — ABNORMAL LOW (ref 12.0–15.0)
Hemoglobin: 9.7 g/dL — ABNORMAL LOW (ref 12.0–15.0)
MCHC: 33.3 g/dL (ref 30.0–36.0)
MCHC: 33.9 g/dL (ref 30.0–36.0)
MCV: 90 fL (ref 78.0–100.0)
MCV: 90.8 fL (ref 78.0–100.0)
Platelets: 197 K/uL (ref 150–400)
Platelets: 215 10*3/uL (ref 150–400)
Platelets: 224 10*3/uL (ref 150–400)
RBC: 3.5 MIL/uL — ABNORMAL LOW (ref 3.87–5.11)
RDW: 12.7 % (ref 11.5–15.5)
RDW: 13.1 % (ref 11.5–15.5)
RDW: 13.4 % (ref 11.5–15.5)
WBC: 5.3 K/uL (ref 4.0–10.5)

## 2011-01-03 LAB — DIFFERENTIAL
Basophils Absolute: 0 K/uL (ref 0.0–0.1)
Basophils Absolute: 0 K/uL (ref 0.0–0.1)
Basophils Relative: 0 % (ref 0–1)
Basophils Relative: 0 % (ref 0–1)
Eosinophils Absolute: 0.3 K/uL (ref 0.0–0.7)
Eosinophils Absolute: 0.4 K/uL (ref 0.0–0.7)
Eosinophils Relative: 5 % (ref 0–5)
Eosinophils Relative: 6 % — ABNORMAL HIGH (ref 0–5)
Lymphocytes Relative: 7 % — ABNORMAL LOW (ref 12–46)
Lymphocytes Relative: 7 % — ABNORMAL LOW (ref 12–46)
Lymphs Abs: 0.4 K/uL — ABNORMAL LOW (ref 0.7–4.0)
Lymphs Abs: 0.5 K/uL — ABNORMAL LOW (ref 0.7–4.0)
Monocytes Absolute: 0.6 K/uL (ref 0.1–1.0)
Monocytes Absolute: 0.8 K/uL (ref 0.1–1.0)
Monocytes Relative: 10 % (ref 3–12)
Monocytes Relative: 11 % (ref 3–12)
Neutro Abs: 4.5 K/uL (ref 1.7–7.7)
Neutro Abs: 6 K/uL (ref 1.7–7.7)
Neutrophils Relative %: 77 % (ref 43–77)
Neutrophils Relative %: 77 % (ref 43–77)

## 2011-01-03 LAB — URINALYSIS, ROUTINE W REFLEX MICROSCOPIC
Leukocytes, UA: NEGATIVE
Nitrite: NEGATIVE
Specific Gravity, Urine: 1.019 (ref 1.005–1.030)
Urobilinogen, UA: 0.2 mg/dL (ref 0.0–1.0)
pH: 6 (ref 5.0–8.0)

## 2011-01-03 LAB — URINE CULTURE

## 2011-01-03 LAB — CULTURE, BLOOD (ROUTINE X 2)
Culture: NO GROWTH
Culture: NO GROWTH

## 2011-01-03 LAB — URINE MICROSCOPIC-ADD ON

## 2011-01-03 LAB — TSH: TSH: 1.858 u[IU]/mL (ref 0.350–4.500)

## 2011-01-04 ENCOUNTER — Other Ambulatory Visit (INDEPENDENT_AMBULATORY_CARE_PROVIDER_SITE_OTHER): Payer: Medicare Other

## 2011-01-04 ENCOUNTER — Other Ambulatory Visit: Payer: Self-pay | Admitting: Internal Medicine

## 2011-01-04 ENCOUNTER — Encounter: Payer: Self-pay | Admitting: Internal Medicine

## 2011-01-04 DIAGNOSIS — Z79899 Other long term (current) drug therapy: Secondary | ICD-10-CM

## 2011-01-04 DIAGNOSIS — E7800 Pure hypercholesterolemia, unspecified: Secondary | ICD-10-CM

## 2011-01-04 DIAGNOSIS — Z0000 Encounter for general adult medical examination without abnormal findings: Secondary | ICD-10-CM

## 2011-01-04 DIAGNOSIS — R350 Frequency of micturition: Secondary | ICD-10-CM

## 2011-01-04 LAB — URINALYSIS, ROUTINE W REFLEX MICROSCOPIC
Ketones, ur: NEGATIVE
Nitrite: NEGATIVE
Specific Gravity, Urine: <=1.005 (ref 1.000–1.030)
Urobilinogen, UA: 0.2 (ref 0.0–1.0)

## 2011-01-04 LAB — LIPID PANEL
LDL Cholesterol: 100 mg/dL — ABNORMAL HIGH (ref 0–99)
VLDL: 9.4 mg/dL (ref 0.0–40.0)

## 2011-01-04 LAB — HEPATIC FUNCTION PANEL
ALT: 15 U/L (ref 0–35)
Alkaline Phosphatase: 58 U/L (ref 39–117)
Bilirubin, Direct: 0.2 mg/dL (ref 0.0–0.3)
Total Bilirubin: 1 mg/dL (ref 0.3–1.2)

## 2011-01-07 ENCOUNTER — Encounter: Payer: Self-pay | Admitting: Internal Medicine

## 2011-01-07 ENCOUNTER — Ambulatory Visit (INDEPENDENT_AMBULATORY_CARE_PROVIDER_SITE_OTHER): Payer: Medicare Other | Admitting: Internal Medicine

## 2011-01-07 VITALS — BP 162/68 | HR 71 | Temp 98.6°F | Resp 12 | Ht 60.0 in | Wt 98.8 lb

## 2011-01-07 DIAGNOSIS — J45909 Unspecified asthma, uncomplicated: Secondary | ICD-10-CM

## 2011-01-07 DIAGNOSIS — Z79899 Other long term (current) drug therapy: Secondary | ICD-10-CM

## 2011-01-07 DIAGNOSIS — F411 Generalized anxiety disorder: Secondary | ICD-10-CM

## 2011-01-07 DIAGNOSIS — I10 Essential (primary) hypertension: Secondary | ICD-10-CM

## 2011-01-07 DIAGNOSIS — E785 Hyperlipidemia, unspecified: Secondary | ICD-10-CM

## 2011-01-07 MED ORDER — ALPRAZOLAM 0.5 MG PO TABS: 0.5000 mg | ORAL_TABLET | Freq: Two times a day (BID) | ORAL | Status: DC | PRN

## 2011-01-07 MED ORDER — AMLODIPINE BESYLATE 10 MG PO TABS: 10.0000 mg | ORAL_TABLET | Freq: Every day | ORAL | Status: DC

## 2011-01-07 NOTE — Progress Notes (Signed)
Subjective:    Patient ID: Elizabeth Peterson, female    DOB: Mar 07, 1919, 77 y.o.   MRN: [id number]  HPI  Here to f/u, overall doing very nicely on the new meds including the amlodipine 5 mg and pravastatin 20 mg;  Pt denies chest pain, increased sob or doe, wheezing, orthopnea, PND, increased LE swelling, palpitations, dizziness or syncope.  Pt denies new neurological symptoms such as new headache, or facial or extremity weakness or numbness   Pt denies polydipsia, polyuria Pt states overall good compliance with meds, trying to follow lower cholesterol, diabetic diet, wt overall stable but little exercise however. Denies worsening depressive symptoms, suicidal ideation, or panic, though has ongoing anxiety, not increased recently.  Needs xanax refill.   No nighttime awakenings with sob/wheezing. Past Medical History  Diagnosis Date  . HYPERLIPIDEMIA 04/16/2007  . ANEMIA-NOS 04/16/2007  . ANXIETY 04/16/2007  . DEPRESSION 09/03/2007  . HYPERTENSION 04/16/2007  . ATHEROSLERO NATV ART EXTREM W/INTERMIT CLAUDICAT 06/05/2010  . ALLERGIC RHINITIS 02/27/2009  . PNEUMONIA, COMMUNITY ACQUIRED, PNEUMOCOCCAL 12/07/2008  . ASTHMA 09/03/2007  . GERD 04/16/2007  . DIVERTICULOSIS, COLON 09/03/2007  . MENOPAUSAL DISORDER 04/18/2008  . OSTEOARTHRITIS, KNEE, LEFT 11/24/2009  . JOINT EFFUSION, LEFT KNEE 02/27/2009  . KNEE PAIN, BILATERAL 06/01/2010  . LOW BACK PAIN 04/16/2007  . LEG PAIN, LEFT 06/01/2010  . WEIGHT LOSS 07/25/2008  . RASH-NONVESICULAR 07/25/2008  . Abdominal pain, generalized 09/03/2007  . BREAST CANCER, HX OF 04/16/2007  . COLONIC POLYPS, HX OF 09/03/2007   Past Surgical History  Procedure Date  . Abdominal hysterectomy 1966  . Tubal ligation   . S/p left mastectomy 2011    reports that she has never smoked. She does not have any smokeless tobacco history on file. She reports that she does not drink alcohol or use illicit drugs. family history includes Cancer in her sister; Diabetes in her other; and  Hypertension in her other. Allergies  Allergen Reactions  . Amlodipine Besylate     REACTION: rash and itch  . Penicillins   . Sulfonamide Derivatives    Current Outpatient Prescriptions on File Prior to Visit  Medication Sig Dispense Refill  . acetaminophen-codeine (TYLENOL #3) 300-30 MG per tablet Take 1 tablet by mouth every 6 (six) hours as needed.        Marland Kitchen albuterol (VENTOLIN HFA) 108 (90 BASE) MCG/ACT inhaler Inhale 2 puffs into the lungs 4 (four) times daily.        Marland Kitchen alendronate (FOSAMAX) 70 MG tablet Take 70 mg by mouth every 7 (seven) days. Take with a full glass of water on an empty stomach.       Marland Kitchen aspirin 81 MG EC tablet Take 81 mg by mouth daily.        . benazepril (LOTENSIN) 20 MG tablet Take 20 mg by mouth daily.        Marland Kitchen omeprazole (PRILOSEC) 20 MG capsule Take 20 mg by mouth 2 (two) times daily.        . Polyethylene Glycol 3350 POWD Use as directed       . pravastatin (PRAVACHOL) 20 MG tablet Take 20 mg by mouth daily.        Marland Kitchen triamcinolone (KENALOG) 0.5 % cream Apply topically 2 (two) times daily.        Marland Kitchen DISCONTD: ALPRAZolam (XANAX) 0.5 MG tablet Take 0.5 mg by mouth 2 (two) times daily as needed.        Marland Kitchen DISCONTD: amLODipine (NORVASC) 5 MG tablet Take 5  mg by mouth daily.         Review of Systems All otherwise neg per pt     Objective:   Physical Exam BP 162/68  Pulse 71  Temp(Src) 98.6 F (37 C) (Oral)  Resp 12  Ht 5' (1.524 m)  Wt 98 lb 12 oz (44.793 kg)  BMI 19.29 kg/m2  SpO2 96% Physical Exam  VS noted Constitutional: Pt appears well-developed and thin for her age, walks with cane HENT: Head: Normocephalic.  Right Ear: External ear normal.  Left Ear: External ear normal.  Eyes: Conjunctivae and EOM are normal. Pupils are equal, round, and reactive to light.  Neck: Normal range of motion. Neck supple.  Cardiovascular: Normal rate and regular rhythm.   Pulmonary/Chest: Effort normal and breath sounds normal.  Abd:  Soft, NT, non-distended, +  BS Neurological: Pt is alert. No cranial nerve deficit.  Skin: Skin is warm. No erythema.  Psychiatric: Pt behavior is normal. Thought content normal. 2+ nervous        Assessment & Plan:

## 2011-01-07 NOTE — Patient Instructions (Addendum)
You are given the refill for the alprazolam (for nerves) - given to you hardcopy today Please increase the amlodipine to 10 mg per day (for blood pressure) - sent to the pharmacy Continue all other medications as before, including the pravastatin for cholesterol Please return in 5 mo with Lab testing done 3-5 days before

## 2011-01-07 NOTE — Assessment & Plan Note (Signed)
stable overall by hx and exam, most recent lab reviewed with pt, and pt to continue medical treatment as before 

## 2011-01-07 NOTE — Assessment & Plan Note (Signed)
Much improved - The current medical regimen is effective;  continue present plan and medications., cont diet, f/u next visit

## 2011-01-07 NOTE — Assessment & Plan Note (Signed)
Also for LFT's with next visit on the statin

## 2011-01-07 NOTE — Assessment & Plan Note (Signed)
Uncontrolled - to increase the amlodipine to 10 mg per day, f/u BP at home and next visit  Lab Results  Component Value Date   WBC 6.2 12/05/2010   HGB 11.9* 12/05/2010   HCT 35.0* 12/05/2010   PLT 227.0 12/05/2010   CHOL 179 01/04/2011   TRIG 47.0 01/04/2011   HDL 69.40 01/04/2011   LDLDIRECT 150.5 12/05/2010   ALT 15 01/04/2011   AST 26 01/04/2011   NA 133* 12/05/2010   K 3.8 12/05/2010   CL 98 12/05/2010   CREATININE 0.9 12/05/2010   BUN 10 12/05/2010   CO2 25 12/05/2010   TSH 0.89 12/05/2010   INR 1.0 ratio 08/25/2009

## 2011-01-17 ENCOUNTER — Other Ambulatory Visit: Payer: Self-pay | Admitting: Internal Medicine

## 2011-02-05 NOTE — H&P (Signed)
NAME:  Elizabeth Peterson, Elizabeth Peterson             ACCOUNT NO.:  [id number]   MEDICAL RECORD NO.:  [id number]          PATIENT TYPE:  INP   LOCATION:  1435                         FACILITY[facility]  [facility]   PHYSICIAN:  Georgina Quint. Plotnikov, MDDATE OF BIRTH:  [birth year]   DATE OF ADMISSION:  11/26/2008  DATE OF DISCHARGE:                              HISTORY & PHYSICAL   CHIEF COMPLAINT:  Dizzy and weak.   HISTORY OF PRESENT ILLNESS:  The patient is a 77 year old female who  presents with above complaint of dizziness and weakness of several days'  duration.  She fell once last week and she has not been able to ambulate  without assistance, unsteady gait.  No nausea, vomiting or diarrhea.  No  chest pain or cough.  No headache.  She was found to have bilateral  pneumonia in the ER with fever and tachycardia.   CURRENT MEDICATIONS:  Alprazolam, amlodipine, aspirin, benazepril,  Femara, pravastatin and Prilosec.   SOCIAL HISTORY:  She lives with her family.  Does not drink.  She is  retired.   FAMILY HISTORY:  Positive for hypertension.   PAST MEDICAL HISTORY:  1. Osteoarthritis.  2. Breast cancer.  3. GERD.  4. Hypertension.  5. Anxiety.   REVIEW OF SYSTEMS:  No chest pain, some shortness of breath with  exertion, mild cough.  The rest of the 18-point review of systems is as  above or negative.   PHYSICAL EXAM:  Temperature 101.3, heart rate 11, saturations 96% in  room air, blood pressure 156/76.  She is in no acute distress, elderly female.  HEENT: Moist mucosa.  NECK: Supple.  No meningeal signs.  LUNGS:  With decreased breath sounds.  Mild crackles at both bases.  HEART:  With tachycardia.  S1-S2, no gallop.  ABDOMEN: Soft, nontender.  No organomegaly or mass felt.  LOWER EXTREMITIES:  Without edema.  Calves nontender.  Pulses normal.  She is alert and cooperative.  Cranial nerves II-XII are normal.  Deep  tendon reflexes and muscle strength are within normal limits.   LABS:  White count  5.8, hemoglobin 11.6, MCV 89.9, platelets 215.  Sodium 133, potassium 2.9.  Urinalysis 0-2 RBCs, leukocytes negative.  Chest X-Ray: Bilateral lower lobe infiltrate versus atelectasis.  EKG  with sinus tachycardia.   ASSESSMENT/PLAN:  1. Pneumonia, likely bilateral:  Start IV antibiotics.  2. Fever: Tylenol p.r.n.  3. Weakness:  IV fluids.  '  4. Hypokalemia.  Replace IV/p.o.  5. Hypertension:  Continue current therapy.  Will hold benazepril.  6. Gastroesophageal reflux disease:  Continue current therapy.  7. Gait disorder:  Obtain Physical Therapy consult.      Georgina Quint. Plotnikov, MD  Electronically Signed     AVP/MEDQ  D:  11/27/2008  T:  11/27/2008  Job:  [id number]   cc:   Corwin Levins, MD  [address]  [place]  [place] [address]

## 2011-02-05 NOTE — Discharge Summary (Signed)
NAME:  Elizabeth Peterson, RAMELLA             ACCOUNT NO.:  [id number]   MEDICAL RECORD NO.:  [id number]          PATIENT TYPE:  INP   LOCATION:  1435                         FACILITY:  [facility]   PHYSICIAN:  Rosalyn Gess. Norins, MD  DATE OF BIRTH:  1919-05-17   DATE OF ADMISSION:  11/27/2008  DATE OF DISCHARGE:  11/29/2008                               DISCHARGE SUMMARY   ADMITTING DIAGNOSES:  1. Bilateral pneumonia possibly with fever.  2. Weakness.  3. Hypokalemia.  4. Hypertension.  5. Gastroesophageal reflux disease.  6. Gait disorder.   DISCHARGE DIAGNOSES:  1. Bibasilar atelectasis.  2. Weakness.  3. Hypokalemia, corrected.  4. Hypertension, stable.  5. Gastroesophageal reflux disease, stable.  6. Generalized weakness with need for home health physical therapy at      discharge.   HISTORY OF PRESENT ILLNESS:  The patient is an 77 year old African  American woman who presented to the emergency department with a  complaint of dizziness and weakness of several weeks duration.  She fell  once while at home and was unable to arise by herself and had not been  able to ambulate without assistance.  She denied any nausea, vomiting or  diarrhea.  She had no chest pain or cough.  She had no headache.  In the  emergency department, an x-ray was performed which was read out as  revealing possible bilateral atelectasis without significant infiltrate.  No signs of congestive heart failure.  The patient was admitted for  treatment of pneumonia, as well as for evaluation of her weakness.   Please see the H and P for past medical history, family history and  social history.   HOSPITAL COURSE:  1. ID.  The patient had spiked a fever to a maximum of 102.9.  Her      white blood count has remained normal with a normal differential      with 77% segs.  She did not have any significant infiltrate on      chest x-ray at admission nor on follow-up which was read as showing      bibasilar atelectasis.   The patient was treated with Rocephin and      azithromycin.  On this regimen, she did do well.  She said she felt      better from a respiratory perspective.  Follow-up chest x-ray as      noted did show atelectasis without infiltrate.  Her white blood      count did remain normal with no left shift.  At this point with the      patient having no cough and no shortness of breath, she is felt to      be stable in this regard and can be discharged to home on oral      antibiotics.  2. Hypokalemia.  The patient's admitting potassium was 2.9, followup      potassium on November 27, 2008, was 3.0.  The patient will need to      continue with potassium replacement and will need follow-up basic      metabolic panel when seen as an  outpatient.  3. Hypertension.  The patient's blood pressure did remain stable      during her hospital stay with a BP on this date of discharge of      113/54.  Plan, the patient to continue home medications at      discharge.  4. Weakness.  The patient complained the she still had significant      weakness and difficulty with ambulation.  She was seen by physical      therapy, who did feel that she was weak and would require 24/7      supervision with physical assist at home and close support for      safety.  She also would be a candidate for in-home physical      therapy.  Please see physical examination.  5. Gastroesophageal reflux disease, patient stable.   DISCHARGE EXAMINATION:  VITAL SIGNS:  Temperature was 99.7, blood  pressure 113/54, heart rate 93, respirations 16.  O2 saturations 97% on  room air.  GENERAL APPEARANCE:  This is an elderly thin African American woman  sitting in a [place] chair who is in no distress.  HEENT:  Showed mild temporal wasting with no other abnormalities.  The  patient is edentulous with dentures in place.  NECK:  Supple.  CHEST:  The patient had no CVA tenderness.  LUNGS:  The patient is moving air well.  I did not appreciate any  rales,  wheezes or rhonchi.  No increased work of breathing.  No use of  accessory musculature of respiration.  CARDIOVASCULAR:  2+ radial pulse.  Her precordium was quiet and heart  rate was regular.  ABDOMEN:  Soft.  No guarding or rebound on general  exam in a sitting position.  NEURO:  The patient is awake and alert.  She is oriented to person,  place, time and context.  Her speech is clear and fluent.  She is aware  of her situation and context.  LOWER EXTREMITY EXAM.  The patient could raise her leg up against  gravity, but offered minimal resistance.  She was able to keep a  straight leg and bent leg with 4/5 strength.  She had 4/5 strength of  the ankle with flexion and dorsiflexion.  Sensation was normal.  Knees  are normal in appearance.  There was no crepitus with movement or  testing.   FINAL LABORATORY:  CBC from over the November 28, 2008, revealed a  hemoglobin 9.7 grams, white count was 7800 with 77% segs, platelet count  224,000.  Final chemistries from November 27, 2008, with sodium 133,  potassium 3.0, chloride 101, CO2 of 27, BUN 11, creatinine 0.82, glucose  was 119.  The patient had liver functions on the November 26, 2008, which  showed minimal elevation with an AST of 71, ALT of 39, alk phos was 58,  albumin slightly depressed at 3.1.  Urinalysis at admission was  negative.  Thyroid function with a TSH checked on November 27, 2008, was  normal at 1.858.   DISPOSITION:  The patient will be discharged home.  Her sister lives  with her who is 10 years younger and able to provide assistance.  Her  son is visiting from out of state and we will able to provide  transportation assistance, as well as ability to get her home.   DISCHARGE MEDICATIONS:  The patient will resume her home medications  including;  1. Alprazolam 0.25 mg nightly.  2. Aspirin 81 mg daily.  3.  Benazepril 20 mg daily.  4. Femara 5 mg daily.  5. Pravachol 20 mg daily.  6. Alendronate 70 mg weekly.  7.  Omeprazole 20 mg b.i.d.  8. Albuterol metered-dose inhaler p.r.n.  9. Citalopram 10 mg daily.  10.Ceftin 250 mg b.i.d. for 5 additional days.   DISPOSITION:  The patient is discharged home.  We will request home  health R.N. visits twice weekly for 4 weeks.  Home health PT/OT  evaluation and treatment as indicated.  No need for durable medical  equipment at this time.   FOLLOW UP:  The patient will see Dr. Oliver Barre, her primary care  physician in 7-10 days.   The patient's condition at the time of discharge dictation is stable and  improved.      Rosalyn Gess Norins, MD  Electronically Signed     MEN/MEDQ  D:  11/29/2008  T:  11/29/2008  Job:  [id number]   cc:   Corwin Levins, MD  [address]  [place]  [place] [address]

## 2011-03-22 ENCOUNTER — Encounter (HOSPITAL_BASED_OUTPATIENT_CLINIC_OR_DEPARTMENT_OTHER): Payer: Medicare Other | Admitting: Oncology

## 2011-03-22 ENCOUNTER — Other Ambulatory Visit: Payer: Self-pay | Admitting: Oncology

## 2011-03-22 DIAGNOSIS — C50119 Malignant neoplasm of central portion of unspecified female breast: Secondary | ICD-10-CM

## 2011-03-22 DIAGNOSIS — Z170 Estrogen receptor positive status [ER+]: Secondary | ICD-10-CM

## 2011-03-22 DIAGNOSIS — C50419 Malignant neoplasm of upper-outer quadrant of unspecified female breast: Secondary | ICD-10-CM

## 2011-03-22 LAB — CBC WITH DIFFERENTIAL/PLATELET
BASO%: 0.5 % (ref 0.0–2.0)
Eosinophils Absolute: 0.1 10*3/uL (ref 0.0–0.5)
LYMPH%: 31.6 % (ref 14.0–49.7)
MCHC: 33.9 g/dL (ref 31.5–36.0)
MONO#: 0.6 10*3/uL (ref 0.1–0.9)
NEUT#: 3 10*3/uL (ref 1.5–6.5)
Platelets: 230 10*3/uL (ref 145–400)
RBC: 3.61 10*6/uL — ABNORMAL LOW (ref 3.70–5.45)
RDW: 14.3 % (ref 11.2–14.5)
WBC: 5.6 10*3/uL (ref 3.9–10.3)

## 2011-03-22 LAB — COMPREHENSIVE METABOLIC PANEL
ALT: 9 U/L (ref 0–35)
Albumin: 4.3 g/dL (ref 3.5–5.2)
Alkaline Phosphatase: 60 U/L (ref 39–117)
CO2: 27 mEq/L (ref 19–32)
Glucose, Bld: 90 mg/dL (ref 70–99)
Potassium: 3.6 mEq/L (ref 3.5–5.3)
Sodium: 136 mEq/L (ref 135–145)
Total Bilirubin: 0.2 mg/dL — ABNORMAL LOW (ref 0.3–1.2)
Total Protein: 7.8 g/dL (ref 6.0–8.3)

## 2011-06-04 ENCOUNTER — Other Ambulatory Visit (INDEPENDENT_AMBULATORY_CARE_PROVIDER_SITE_OTHER): Payer: Medicare Other

## 2011-06-04 ENCOUNTER — Other Ambulatory Visit: Payer: Self-pay | Admitting: Internal Medicine

## 2011-06-04 DIAGNOSIS — Z79899 Other long term (current) drug therapy: Secondary | ICD-10-CM

## 2011-06-04 DIAGNOSIS — I10 Essential (primary) hypertension: Secondary | ICD-10-CM

## 2011-06-04 DIAGNOSIS — E785 Hyperlipidemia, unspecified: Secondary | ICD-10-CM

## 2011-06-04 LAB — LIPID PANEL
Cholesterol: 227 mg/dL — ABNORMAL HIGH (ref 0–200)
HDL: 78.8 mg/dL (ref >39.00)
Triglycerides: 60 mg/dL (ref 0.0–149.0)

## 2011-06-04 LAB — BASIC METABOLIC PANEL
CO2: 27 mEq/L (ref 19–32)
Calcium: 9.5 mg/dL (ref 8.4–10.5)
Creatinine, Ser: 0.9 mg/dL (ref 0.4–1.2)
GFR: 72.79 mL/min (ref >60.00)
Glucose, Bld: 89 mg/dL (ref 70–99)

## 2011-06-04 LAB — HEPATIC FUNCTION PANEL
ALT: 13 U/L (ref 0–35)
Total Bilirubin: 0.5 mg/dL (ref 0.3–1.2)
Total Protein: 8.1 g/dL (ref 6.0–8.3)

## 2011-06-07 ENCOUNTER — Ambulatory Visit (INDEPENDENT_AMBULATORY_CARE_PROVIDER_SITE_OTHER): Payer: Medicare Other | Admitting: Internal Medicine

## 2011-06-07 ENCOUNTER — Encounter: Payer: Self-pay | Admitting: Internal Medicine

## 2011-06-07 VITALS — BP 130/46 | HR 82 | Temp 98.4°F | Ht 62.0 in | Wt 111.5 lb

## 2011-06-07 DIAGNOSIS — I10 Essential (primary) hypertension: Secondary | ICD-10-CM

## 2011-06-07 DIAGNOSIS — M1990 Unspecified osteoarthritis, unspecified site: Secondary | ICD-10-CM

## 2011-06-07 DIAGNOSIS — Z0000 Encounter for general adult medical examination without abnormal findings: Secondary | ICD-10-CM

## 2011-06-07 DIAGNOSIS — F411 Generalized anxiety disorder: Secondary | ICD-10-CM

## 2011-06-07 DIAGNOSIS — E785 Hyperlipidemia, unspecified: Secondary | ICD-10-CM

## 2011-06-07 DIAGNOSIS — Z23 Encounter for immunization: Secondary | ICD-10-CM

## 2011-06-07 MED ORDER — TRIAMCINOLONE ACETONIDE 0.5 % EX CREA: TOPICAL_CREAM | Freq: Two times a day (BID) | CUTANEOUS | Status: DC

## 2011-06-07 MED ORDER — ALPRAZOLAM 0.5 MG PO TABS: 0.5000 mg | ORAL_TABLET | Freq: Two times a day (BID) | ORAL | Status: DC | PRN

## 2011-06-07 MED ORDER — PRAVASTATIN SODIUM 40 MG PO TABS: 40.0000 mg | ORAL_TABLET | Freq: Every evening | ORAL | Status: DC

## 2011-06-07 MED ORDER — ACETAMINOPHEN-CODEINE #3 300-30 MG PO TABS: 1.0000 | ORAL_TABLET | Freq: Four times a day (QID) | ORAL | Status: DC | PRN

## 2011-06-07 NOTE — Assessment & Plan Note (Signed)
Uncontrolled, to increase the pravachol to 40 mg, pt to monitor for any worsening weakness or tendency to fall, or myalgias/arthralagias; f/u labs next visit, cont lower chol diet

## 2011-06-07 NOTE — Assessment & Plan Note (Signed)
stable overall by hx and exam, most recent data reviewed with pt, and pt to continue medical treatment as before  Lab Results  Component Value Date   WBC 6.2 12/05/2010   HGB 11.2* 03/22/2011   HCT 33.2* 03/22/2011   PLT 230 03/22/2011   CHOL 227* 06/04/2011   TRIG 60.0 06/04/2011   HDL 78.80 06/04/2011   LDLDIRECT 141.4 06/04/2011   ALT 13 06/04/2011   AST 25 06/04/2011   NA 137 06/04/2011   K 4.0 06/04/2011   CL 101 06/04/2011   CREATININE 0.9 06/04/2011   BUN 23 06/04/2011   CO2 27 06/04/2011   TSH 0.89 12/05/2010   INR 1.0 ratio 08/25/2009

## 2011-06-07 NOTE — Patient Instructions (Addendum)
You had the flu shot today Increase the pravastatin to 40 mg (for cholesterol) (sent to your pharmacy on the computer) Continue all other medications as before (including the cream) You are given the hardcopy for the alprazolam and pain medication to take to the pharmacy Please have the pharmacy call if you need further refills on any other medications Please return in 6 mo with Lab testing done 3-5 days before

## 2011-06-07 NOTE — Assessment & Plan Note (Signed)
stable overall by hx and exam, most recent data reviewed with pt, and pt to continue medical treatment as before  BP Readings from Last 3 Encounters:  06/07/11 130/46  01/07/11 162/68  12/05/10 170/70

## 2011-06-07 NOTE — Assessment & Plan Note (Signed)
Multi joint involvement, on chronic pain med without constipation or other apparent SE;  Ok to cont same meds, f/u next visit, overall gets around very well for her age without other need for support

## 2011-06-07 NOTE — Progress Notes (Signed)
Subjective:    Patient ID: Elizabeth Peterson, female    DOB: [birth year], 77 y.o.   MRN: [id number]  HPI  Here to f/u; walks with cane and c/o arthritic type pain to the right hip but ok with current meds.  Denies worsening depressive symptoms, suicidal ideation, or panic, though has ongoing anxiety, not increased recently, and controlled with current meds.  Pt denies chest pain, increased sob or doe, wheezing, orthopnea, PND, increased LE swelling, palpitations, dizziness or syncope. Pt denies new neurological symptoms such as new headache, or facial or extremity weakness or numbness.   Pt denies polydipsia, polyuria,  Pt states overall good compliance with meds, trying to follow lower cholesterol, diabetic diet, wt overall stable.   Pt denies fever, wt loss, night sweats, loss of appetite, or other constitutional symptoms  No recent falls or increased pain. Past Medical History  Diagnosis Date  . HYPERLIPIDEMIA 04/16/2007  . ANEMIA-NOS 04/16/2007  . ANXIETY 04/16/2007  . DEPRESSION 09/03/2007  . HYPERTENSION 04/16/2007  . ATHEROSLERO NATV ART EXTREM W/INTERMIT CLAUDICAT 06/05/2010  . ALLERGIC RHINITIS 02/27/2009  . PNEUMONIA, COMMUNITY ACQUIRED, PNEUMOCOCCAL 12/07/2008  . ASTHMA 09/03/2007  . GERD 04/16/2007  . DIVERTICULOSIS, COLON 09/03/2007  . MENOPAUSAL DISORDER 04/18/2008  . OSTEOARTHRITIS, KNEE, LEFT 11/24/2009  . JOINT EFFUSION, LEFT KNEE 02/27/2009  . KNEE PAIN, BILATERAL 06/01/2010  . LOW BACK PAIN 04/16/2007  . LEG PAIN, LEFT 06/01/2010  . WEIGHT LOSS 07/25/2008  . RASH-NONVESICULAR 07/25/2008  . Abdominal pain, generalized 09/03/2007  . BREAST CANCER, HX OF 04/16/2007  . COLONIC POLYPS, HX OF 09/03/2007   Past Surgical History  Procedure Date  . Abdominal hysterectomy 1966  . Tubal ligation   . S/p left mastectomy 2011    reports that she has never smoked. She does not have any smokeless tobacco history on file. She reports that she does not drink alcohol or use illicit drugs. family history  includes Cancer in her sister; Diabetes in her other; and Hypertension in her other. Allergies  Allergen Reactions  . Amlodipine Besylate     REACTION: rash and itch  . Penicillins   . Sulfonamide Derivatives    Current Outpatient Prescriptions on File Prior to Visit  Medication Sig Dispense Refill  . albuterol (VENTOLIN HFA) 108 (90 BASE) MCG/ACT inhaler Inhale 2 puffs into the lungs 4 (four) times daily.        Marland Kitchen alendronate (FOSAMAX) 70 MG tablet Take 70 mg by mouth every 7 (seven) days. Take with a full glass of water on an empty stomach.       Marland Kitchen amLODipine (NORVASC) 10 MG tablet Take 1 tablet (10 mg total) by mouth daily.  30 tablet  11  . aspirin 81 MG EC tablet Take 81 mg by mouth daily.        . benazepril (LOTENSIN) 20 MG tablet Take 20 mg by mouth daily.        Marland Kitchen omeprazole (PRILOSEC) 20 MG capsule Take 20 mg by mouth 2 (two) times daily.        . Polyethylene Glycol 3350 POWD Use as directed        Review of Systems Review of Systems  Constitutional: Negative for diaphoresis and unexpected weight change.  HENT: Negative for drooling and tinnitus.   Eyes: Negative for photophobia and visual disturbance.  Respiratory: Negative for choking and stridor.   Gastrointestinal: Negative for vomiting and blood in stool.  Genitourinary: Negative for hematuria and decreased urine volume.    Objective:  Physical Exam BP 130/46  Pulse 82  Temp(Src) 98.4 F (36.9 C) (Oral)  Ht 5\' 2"  (1.575 m)  Wt 111 lb 8 oz (50.576 kg)  BMI 20.39 kg/m2  SpO2 97% \Physical Exam  VS noted, not ill appearing Constitutional: Pt appears well-developed and well-nourished.  HENT: Head: Normocephalic.  Right Ear: External ear normal.  Left Ear: External ear normal.  Eyes: Conjunctivae and EOM are normal. Pupils are equal, round, and reactive to light.  Neck: Normal range of motion. Neck supple.  Cardiovascular: Normal rate and regular rhythm.   Pulmonary/Chest: Effort normal and breath sounds  normal.  Abd:  Soft, NT, non-distended, + BS Neurological: Pt is alert. No cranial nerve deficit.  Skin: Skin is warm. No erythema.  Psychiatric: Pt behavior is normal. Thought content normal.  No active synovitis. Gait ok with cane       Assessment & Plan:

## 2011-08-08 ENCOUNTER — Encounter: Payer: Self-pay | Admitting: Internal Medicine

## 2011-08-20 ENCOUNTER — Telehealth: Payer: Self-pay

## 2011-08-20 DIAGNOSIS — R413 Other amnesia: Secondary | ICD-10-CM

## 2011-08-20 NOTE — Telephone Encounter (Signed)
The patients daughter called requesting a referral for the patient to a Neurologist for Dementia.  Call back number is [phone]

## 2011-08-20 NOTE — Telephone Encounter (Signed)
Ok - done per emr 

## 2011-08-21 ENCOUNTER — Encounter: Payer: Self-pay | Admitting: Neurology

## 2011-08-21 NOTE — Telephone Encounter (Signed)
Called the patients daughter tried to leave message that referral requested had been made, but was unable to leave message.

## 2011-09-08 ENCOUNTER — Other Ambulatory Visit: Payer: Self-pay | Admitting: Internal Medicine

## 2011-10-01 ENCOUNTER — Ambulatory Visit (INDEPENDENT_AMBULATORY_CARE_PROVIDER_SITE_OTHER)
Admission: RE | Admit: 2011-10-01 | Discharge: 2011-10-01 | Disposition: A | Discharge status: Home / Self Care | Payer: Medicare Other | Source: Ambulatory Visit

## 2011-10-01 ENCOUNTER — Ambulatory Visit (INDEPENDENT_AMBULATORY_CARE_PROVIDER_SITE_OTHER): Payer: Medicare Other | Admitting: Neurology

## 2011-10-01 ENCOUNTER — Other Ambulatory Visit: Payer: Self-pay | Admitting: Neurology

## 2011-10-01 ENCOUNTER — Encounter: Payer: Self-pay | Admitting: Neurology

## 2011-10-01 ENCOUNTER — Other Ambulatory Visit (INDEPENDENT_AMBULATORY_CARE_PROVIDER_SITE_OTHER): Payer: Medicare Other

## 2011-10-01 DIAGNOSIS — G609 Hereditary and idiopathic neuropathy, unspecified: Secondary | ICD-10-CM

## 2011-10-01 DIAGNOSIS — R413 Other amnesia: Secondary | ICD-10-CM

## 2011-10-01 DIAGNOSIS — R7309 Other abnormal glucose: Secondary | ICD-10-CM

## 2011-10-01 LAB — COMPREHENSIVE METABOLIC PANEL
ALT: 15 U/L (ref 0–35)
AST: 25 U/L (ref 0–37)
Alkaline Phosphatase: 60 U/L (ref 39–117)
BUN: 16 mg/dL (ref 6–23)
Chloride: 105 mEq/L (ref 96–112)
Creatinine, Ser: 0.9 mg/dL (ref 0.4–1.2)
Total Bilirubin: 0.5 mg/dL (ref 0.3–1.2)

## 2011-10-01 LAB — CBC WITH DIFFERENTIAL/PLATELET
Basophils Absolute: 0 10*3/uL (ref 0.0–0.1)
Basophils Relative: 0.6 % (ref 0.0–3.0)
Eosinophils Absolute: 0.1 10*3/uL (ref 0.0–0.7)
HCT: 36.6 % (ref 36.0–46.0)
Hemoglobin: 12.6 g/dL (ref 12.0–15.0)
Lymphocytes Relative: 21.3 % (ref 12.0–46.0)
Lymphs Abs: 1.3 10*3/uL (ref 0.7–4.0)
MCHC: 34.5 g/dL (ref 30.0–36.0)
MCV: 90.6 fl (ref 78.0–100.0)
Monocytes Absolute: 0.4 10*3/uL (ref 0.1–1.0)
Neutro Abs: 4.3 10*3/uL (ref 1.4–7.7)
RBC: 4.04 Mil/uL (ref 3.87–5.11)
RDW: 13.8 % (ref 11.5–14.6)

## 2011-10-01 LAB — SEDIMENTATION RATE: Sed Rate: 29 mm/hr — ABNORMAL HIGH (ref 0–22)

## 2011-10-01 LAB — VITAMIN B12: Vitamin B-12: 253 pg/mL (ref 211–911)

## 2011-10-01 LAB — HEMOGLOBIN A1C: Hgb A1c MFr Bld: 5.3 % (ref 4.6–6.5)

## 2011-10-01 MED ORDER — SERTRALINE HCL 25 MG PO TABS: 25.0000 mg | ORAL_TABLET | Freq: Every day | ORAL | Status: DC

## 2011-10-01 NOTE — Progress Notes (Signed)
Dear Dr. Jonny Ruiz,  Thank you for having me see Elizabeth Peterson in consultation today at [facility] for her problem with memory problems and behavior changes.  As you may recall, she is a 78 y.o. year old female with a history of anxiety, depression, breast cancer and hyperlipidemia who presents with several year history of memory loss, now with worsening behavioral problems.  She has severe periods of disorientation with agitation.  She forgets recent conversations and repeats statements.  Her daughter who accompanies her says that her behavior includes cursing which is not like her.  They think it is related to her use of Xanax, which she takes for anxiety.    She typically takes more than two tabs of xanax per day.  She typically runs out 7-10 days before the end of the month.  With the decrease in her Xanax intake her behavior improves significantly.  She is also having problems walking, with multiple falls.  She has used a cane for several years.  She attributes this to her arthritis in her left > right knee.  She denies a history of stroke, head trauma, seizure or intracranial infection.  She has a long history of anxiety and depression.  She was on Valium in the past but according to her daughter "got addicted".  It does not appear she has ever been on an SSRI.  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  They deny hallucinations, but she does have memory distortions.  She lives with her sister who is 23.  Her daughter who accompanies her today is from [place].  Past Medical History  Diagnosis Date  . HYPERLIPIDEMIA 04/16/2007  . ANEMIA-NOS 04/16/2007  . ANXIETY 04/16/2007  . DEPRESSION 09/03/2007  . HYPERTENSION 04/16/2007  . ATHEROSLERO NATV ART EXTREM W/INTERMIT CLAUDICAT 06/05/2010  . ALLERGIC RHINITIS 02/27/2009  . PNEUMONIA, COMMUNITY ACQUIRED, PNEUMOCOCCAL 12/07/2008  . ASTHMA 09/03/2007  . GERD 04/16/2007  . DIVERTICULOSIS, COLON 09/03/2007  . MENOPAUSAL  DISORDER 04/18/2008  . OSTEOARTHRITIS, KNEE, LEFT 11/24/2009  . JOINT EFFUSION, LEFT KNEE 02/27/2009  . KNEE PAIN, BILATERAL 06/01/2010  . LOW BACK PAIN 04/16/2007  . LEG PAIN, LEFT 06/01/2010  . WEIGHT LOSS 07/25/2008  . RASH-NONVESICULAR 07/25/2008  . Abdominal pain, generalized 09/03/2007  . BREAST CANCER, HX OF 04/16/2007  . COLONIC POLYPS, HX OF 09/03/2007    Past Surgical History  Procedure Date  . Abdominal hysterectomy 1966  . Tubal ligation   . S/p left mastectomy 2011    History   Social History  . Marital Status: Single    Spouse Name: N/A    Number of Children: N/A  . Years of Education: N/A   Occupational History  . retired    Social History Main Topics  . Smoking status: Never Smoker   . Smokeless tobacco: Never Used  . Alcohol Use: No  . Drug Use: No  . Sexually Active: None   Other Topics Concern  . None   Social History Narrative  . None    Family History  Problem Relation Age of Onset  . Cancer Sister     Breast  . Hypertension Other   . Diabetes Other     1st degree relative    Current Outpatient Prescriptions on File Prior to Visit  Medication Sig Dispense Refill  . acetaminophen-codeine (TYLENOL #3) 300-30 MG per tablet Take 1 tablet by mouth every 6 (six) hours as needed.  120 tablet  5  . albuterol (VENTOLIN HFA)  108 (90 BASE) MCG/ACT inhaler Inhale 2 puffs into the lungs 4 (four) times daily.        Marland Kitchen alendronate (FOSAMAX) 70 MG tablet Take 70 mg by mouth every 7 (seven) days. Take with a full glass of water on an empty stomach.       . ALPRAZolam (XANAX) 0.5 MG tablet Take 1 tablet (0.5 mg total) by mouth 2 (two) times daily as needed.  60 tablet  5  . aspirin 81 MG EC tablet Take 81 mg by mouth daily.        . benazepril (LOTENSIN) 20 MG tablet Take 20 mg by mouth daily.        Marland Kitchen omeprazole (PRILOSEC) 20 MG capsule Take 20 mg by mouth 2 (two) times daily.        . Polyethylene Glycol 3350 POWD Use as directed       . pravastatin (PRAVACHOL)  40 MG tablet Take 1 tablet (40 mg total) by mouth every evening.  90 tablet  3  . triamcinolone cream (KENALOG) 0.5 % APPLY TO AFFECTED AREA TWICE A DAY  30 g  2  . amLODipine (NORVASC) 10 MG tablet Take 1 tablet (10 mg total) by mouth daily.  30 tablet  11    Allergies  Allergen Reactions  . Amlodipine Besylate     REACTION: rash and itch  . Penicillins   . Sulfonamide Derivatives       ROS:  13 systems were reviewed and are notable for chronic knee pain.  All other review of systems are unremarkable except where noted above.   Examination:  Filed Vitals:   10/01/11 0915  BP: 128/60  Pulse: 88  Weight: 111 lb (50.349 kg)     In general, small elderly women.  Cardiovascular: The patient has a regular rate and rhythm and no carotid bruits.  Fundoscopy:  Disks are flat. Vessel caliber within normal limits.  Mental status:   MMSE 19/27 with 3 points lost for time, 2 points lost for place and 3 points lost for 3 word recall.  Did not testing reading, figure drawing, or writing.  Cranial Nerves: Pupils are equally round and reactive to light. Visual fields full to confrontation. Extraocular movements are intact without nystagmus. Facial sensation and muscles of mastication are intact. Muscles of facial expression are symmetric. Hearing intact to bilateral finger rub. Tongue protrusion, uvula, palate midline.  Shoulder shrug intact  Motor:  The patient has normal bulk and tone, no pronator drift.  There are no adventitious movements.  5/5 bilaterally, except 4/5 hip flexor.  Reflexes:   Biceps  Triceps Brachioradialis Knee Ankle  Right 2+  2+  2+   2+ 0  Left  2+  2+  2+   2+ 0  Toes down  Coordination:  Normal finger to nose.    Sensation is decreased to temperature and vibration in hands and feet as well as toes, position sense impaired in feet.  Gait and Station are slow, wide based, with an antalgic component.  ?Decreased left arm swing. Romberg is  positive.   Impression/Recs: 1.  Memory disorder - Patient likely has an underling dementia, likely Alzheimer's type.  However, her behavior is clearly worse on the Xanax and I think she should stop this.  Normally I would not stop it suddenly, but she routinely stops it for 7-10 days per month because she runs out.  She also tends to improve during this period and clearly does not seem to  go in withdrawal.   I am going to get a CT head to rule out NPH - which I think is unlikely.   2.  Anxiety and depression - I have started the patient on sertraline with the goal of helping her anxiety and depression as well as her behavior.  Hopefully this helps in lieu of Xanax.  We will start at a low dose of 25mg  daily. 3.  Gait problems - She has a peripheral neuropathy.  Not sure this is the whole cause of her gait problems but will send off screening labs today.   We will see the patient back in 6 weeks.  Thank you for having [place] see Elizabeth Peterson in consultation.  Feel free to contact me with any questions.  Lupita Raider Modesto Charon, MD [facility], [place] [address] [place], [place] [address] Phone: [phone] Fax: [phone].

## 2011-10-01 NOTE — Patient Instructions (Addendum)
Go to the basement to have your labs drawn today.   Go today for your CT scan at [address]  3rd floor in the [facility].  Please arrive by 2:15pm.

## 2011-10-02 LAB — C-REACTIVE PROTEIN: CRP: 0.21 mg/dL (ref <0.60)

## 2011-10-03 ENCOUNTER — Telehealth: Payer: Self-pay | Admitting: Neurology

## 2011-10-03 LAB — SPEP & IFE WITH QIG
Alpha-2-Globulin: 11.9 % — ABNORMAL HIGH (ref 7.1–11.8)
Beta 2: 4.6 % (ref 3.2–6.5)
Gamma Globulin: 18.2 % (ref 11.1–18.8)
IgG (Immunoglobin G), Serum: 1630 mg/dL (ref 690–1700)

## 2011-10-03 NOTE — Telephone Encounter (Signed)
Pt's daughter would like to know if it necessary for her to travel from [place] to be at her daughter's fu appt in Feb.

## 2011-10-04 ENCOUNTER — Telehealth: Payer: Self-pay | Admitting: Neurology

## 2011-10-04 NOTE — Telephone Encounter (Signed)
Pt's daughter claims that pt is disoriented for about 75% of the day, and not only when she is removed from her home as previously thought. Daughter wanted to keep [place] informed. She was also checking on her previous note, asking if she needed to travel to [facility] for her mother's fu appt.

## 2011-10-07 NOTE — Telephone Encounter (Signed)
I left a message for the patient's daughter to call me. 

## 2011-10-08 NOTE — Telephone Encounter (Signed)
Will check with Dr. Modesto Charon to see if she needs to come for next ov.

## 2011-10-08 NOTE — Telephone Encounter (Signed)
I left a message for the patient's daughter to call me.

## 2011-10-09 NOTE — Telephone Encounter (Signed)
Let daughter know that CT looked ok, some atrophy, but nothing we need to act on right now.  It would be good if someone could be there who knows her -- not sure if her sister could accompany her -- otherwise it is going to be difficult to see how she is doing.

## 2011-10-09 NOTE — Telephone Encounter (Signed)
I left a message for the patient's daughter, [name] to call me. I have tried several times to reach her.

## 2011-10-10 NOTE — Telephone Encounter (Signed)
I left a message for the patient's daughter to call me. Recording stated voice mailbox was full and to try again later or to leave a call back number. I left a call back number.

## 2011-10-10 NOTE — Telephone Encounter (Signed)
I left a message for the patient's daughter, [name] to return my call.

## 2011-10-11 NOTE — Telephone Encounter (Signed)
I left a message for the patient's daughter to call me. [phone])

## 2011-10-11 NOTE — Telephone Encounter (Signed)
I left a message for the patient's daughter to call me. ([phone])   

## 2011-10-11 NOTE — Progress Notes (Signed)
B12 low normal, and MMA slightly high.  We are trying to contact patient's daughter about oral supplementation.  I don't think this explains her memory problems, but given her gait difficulties as well as memory problems supplementation makes sense.  If we can't get a hold of patient's daughter we will start her on B12 when she returns to clinic.

## 2011-10-14 ENCOUNTER — Telehealth: Payer: Self-pay | Admitting: Neurology

## 2011-10-14 NOTE — Telephone Encounter (Signed)
Received a call from patient's daughter, [place] in [place], [place]. I explained that I had indeed been trying to call her since 01/11 at both her home and cell numbers with information about her mother. Information given as per Dr. Modesto Charon re: lab results and his recommendation to start B12 1000 mcg daily. I also let her know that her mom's head CT was basically normal for her age with only minimal atrophy. We discussed the need to bring her mother to her f/u appointment with Dr. Modesto Charon February 21st. She states she will come with her. She also tells me that her mom is taking the Zoloft whenever she wants to and may be taking it more than prescribed. She states that they tried to hide the medicine but "mom tore the house apart looking for it" so we gave it back to her. The Xanax medication was taken home by [facility] so that her mom would not take it. We discussed her diagnosis of dementia and that progression is to be expected. Discussed possible future plans and the need for patient safety in her ADLs as well as her medication management. We talked about a nurse tech coming to the home and the need for a pill box. Mentioned placement in a facility but [name] states "she would never go". Will wait to start the B12 when she comes back in February. I sensed that there may be some additional family dynamics going on as well between siblings and her mom's sister, Marily Memos. They live together currently. [name] feels trust is an issue here. I told her that I would let Dr. Modesto Charon know that we spoke and that we would see them back in February. [name] was in favor of this plan at this time. Told her to call us prn. Total phone call = 28 minutes.  **Dr. Modesto Charon, for your information...Marland KitchenMarland Kitchen

## 2011-10-14 NOTE — Telephone Encounter (Signed)
I left a message for the patient's daughter, [name] to call me.  

## 2011-10-14 NOTE — Telephone Encounter (Signed)
Thanks, you rock

## 2011-10-14 NOTE — Telephone Encounter (Signed)
Message copied by Benay Spice on Mon Oct 14, 2011  2:23 PM ------      Message from: [place], [place]      Created: Fri Oct 11, 2011 10:58 AM       MMA slightly high, B12 borderline.  We need to start the patient on oral B12 supplementation, although I don't think this explains all her symptoms.            Jan - I know you are trying to get a hold of Ms. Banta's daughter, but if you do, tell her that we need to start her on oral B12 -- it might be contributing to some of her memory problems - but is likely not the whole reason.  If you do get a hold of her you can call in per day oral B12.  Otherwise we can wait until she returns for her appt to start her.

## 2011-10-14 NOTE — Telephone Encounter (Signed)
Received a call from patient's daughter, [name] in [place], [place]. I explained that I had indeed been trying to call her since 01/11 at both her home and cell numbers with information about her mother. Information given as per Dr. Wong re: lab results and his recommendation to start B12 1000 mcg daily. I also let her know that her mom's head CT was basically normal for her age with only minimal atrophy. We discussed the need to bring her mother to her f/u appointment with Dr. Wong February 21st. She states she will come with her. She also tells me that her mom is taking the Zoloft whenever she wants to and may be taking it more than prescribed. She states that they tried to hide the medicine but "mom tore the house apart looking for it" so we gave it back to her. The Xanax medication was taken home by [name] so that her mom would not take it. We discussed her diagnosis of dementia and that progression is to be expected. Discussed possible future plans and the need for patient safety in her ADLs as well as her medication management. We talked about a nurse tech coming to the home and the need for a pill box. Mentioned placement in a facility but [name] states "she would never go". Will wait to start the B12 when she comes back in February. I sensed that there may be some additional family dynamics going on as well between siblings and her mom's sister, Edna. They live together currently. [name] feels trust is an issue here. I told her that I would let Dr. Wong know that we spoke and that we would see them back in February. [name] was in favor of this plan at this time. Told her to call us prn. Total phone call = 28 minutes.  **Dr. Wong, for your information.....   

## 2011-10-14 NOTE — Telephone Encounter (Signed)
Message copied by Benay Spice on Mon Oct 14, 2011  2:02 PM ------      Message from: [place], [place]      Created: Fri Oct 11, 2011 10:58 AM       MMA slightly high, B12 borderline.  We need to start the patient on oral B12 supplementation, although I don't think this explains all her symptoms.            Jan - I know you are trying to get a hold of Elizabeth Peterson's daughter, but if you do, tell her that we need to start her on oral B12 -- it might be contributing to some of her memory problems - but is likely not the whole reason.  If you do get a hold of her you can call in per day oral B12.  Otherwise we can wait until she returns for her appt to start her.

## 2011-10-14 NOTE — Telephone Encounter (Signed)
Received a call from patient's daughter, Elizabeth Peterson in [place], [place]. I explained that I had indeed been trying to call her since 01/11 at both her home and cell numbers with information about her mother. Information given as per Dr. Wong re: lab results and his recommendation to start B12 1000 mcg daily. I also let her know that her mom's head CT was basically normal for her age with only minimal atrophy. We discussed the need to bring her mother to her f/u appointment with Dr. Wong February 21st. She states she will come with her. She also tells me that her mom is taking the Zoloft whenever she wants to and may be taking it more than prescribed. She states that they tried to hide the medicine but "mom tore the house apart looking for it" so we gave it back to her. The Xanax medication was taken home by Elizabeth Peterson so that her mom would not take it. We discussed her diagnosis of dementia and that progression is to be expected. Discussed possible future plans and the need for patient safety in her ADLs as well as her medication management. We talked about a nurse tech coming to the home and the need for a pill box. Mentioned placement in a facility but Elizabeth Peterson states "she would never go". Will wait to start the B12 when she comes back in February. I sensed that there may be some additional family dynamics going on as well between siblings and her mom's sister, Edna. They live together currently. Elizabeth Peterson feels trust is an issue here. I told her that I would let Dr. Wong know that we spoke and that we would see them back in February. Elizabeth Peterson was in favor of this plan at this time. Told her to call us prn. Total phone call = 28 minutes.  **Dr. Wong, for your information.....   

## 2011-10-14 NOTE — Telephone Encounter (Signed)
I left a message for the patient's daughter, [name] to call me.

## 2011-11-14 ENCOUNTER — Ambulatory Visit (INDEPENDENT_AMBULATORY_CARE_PROVIDER_SITE_OTHER): Payer: Medicare Other | Admitting: Neurology

## 2011-11-14 ENCOUNTER — Encounter: Payer: Self-pay | Admitting: Neurology

## 2011-11-14 VITALS — BP 130/60 | HR 84 | Wt 103.0 lb

## 2011-11-14 DIAGNOSIS — F0280 Dementia in other diseases classified elsewhere without behavioral disturbance: Secondary | ICD-10-CM

## 2011-11-14 MED ORDER — SERTRALINE HCL 25 MG PO TABS: 25.0000 mg | ORAL_TABLET | Freq: Every day | ORAL | Status: DC

## 2011-11-14 NOTE — Progress Notes (Signed)
Dear Dr. Jonny Ruiz,  I saw  Elizabeth Peterson back in [facility] for her problem with probable Alzheimer's type dementia.  As you may recall, she is a 78 y.o. year old female with a history of anxiety, depression, breast cancer and hyperlipidemia who has had a several year history of memory problems.  When I saw her she was having significant behavioral problems that I felt was related to her use of Xanax for anxiety.    We stopped the Xanax at her last appointment and replaced it with sertraline.  Her daughter who accompanies her feels like she has her "mother back".  She is more settled.  She has had no more paranoia, delusions and erratic behaviors.  Memory is still impaired.  I also got a CT head which revealed fronto temporal regional volume loss and mild to moderate ventriculomegaly.  Medical history, social history, and family history were reviewed and have not changed since the last clinic visit.  Current Outpatient Prescriptions on File Prior to Visit  Medication Sig Dispense Refill  . acetaminophen-codeine (TYLENOL #3) 300-30 MG per tablet Take 1 tablet by mouth every 6 (six) hours as needed.  120 tablet  5  . albuterol (VENTOLIN HFA) 108 (90 BASE) MCG/ACT inhaler Inhale 2 puffs into the lungs 4 (four) times daily.        Marland Kitchen alendronate (FOSAMAX) 70 MG tablet Take 70 mg by mouth every 7 (seven) days. Take with a full glass of water on an empty stomach.       Marland Kitchen amLODipine (NORVASC) 10 MG tablet Take 1 tablet (10 mg total) by mouth daily.  30 tablet  11  . aspirin 81 MG EC tablet Take 81 mg by mouth daily.        . benazepril (LOTENSIN) 20 MG tablet Take 20 mg by mouth daily.        Marland Kitchen omeprazole (PRILOSEC) 20 MG capsule Take 20 mg by mouth 2 (two) times daily.        . Polyethylene Glycol 3350 POWD Use as directed       . pravastatin (PRAVACHOL) 40 MG tablet Take 1 tablet (40 mg total) by mouth every evening.  90 tablet  3  . triamcinolone cream (KENALOG) 0.5 % APPLY TO AFFECTED  AREA TWICE A DAY  30 g  2    Allergies  Allergen Reactions  . Amlodipine Besylate     REACTION: rash and itch  . Penicillins   . Sulfonamide Derivatives     ROS:  13 systems were reviewed and are notable for still problems with gait.  All other review of systems are unremarkable.  Exam: . Filed Vitals:   11/14/11 1113  BP: 130/60  Pulse: 84  Weight: 103 lb (46.72 kg)    In general, thin appearing women.  Abbreviated MM:  3/5 time orientation; 5/5 place orientation; 3/3 registration 3/5 WORLD 0/3 recall.  Impression/Recommendations: 1.  Memory loss - possibly Alzheimer's type;  I am not particularly concerned for NPH given her CT head.  At her next visit I will consider starting donepezil, but I would like to see how her behavior does on sertraline. 2.  Anxiety and depression - continue sertraline;  will increase if necessary. 3.  Gait disorder - patient probably should be on oral B12 supplementation for borderline B12.  Will double check this at next visit.  We will see the patient back in 3 months.  Lupita Raider Modesto Charon, MD [facility], 

## 2011-12-06 ENCOUNTER — Other Ambulatory Visit (INDEPENDENT_AMBULATORY_CARE_PROVIDER_SITE_OTHER): Payer: Medicare Other

## 2011-12-06 ENCOUNTER — Ambulatory Visit (INDEPENDENT_AMBULATORY_CARE_PROVIDER_SITE_OTHER): Payer: Medicare Other | Admitting: Internal Medicine

## 2011-12-06 ENCOUNTER — Encounter: Payer: Self-pay | Admitting: Internal Medicine

## 2011-12-06 VITALS — BP 154/60 | HR 84 | Temp 98.4°F | Ht 60.0 in | Wt 102.5 lb

## 2011-12-06 DIAGNOSIS — M25569 Pain in unspecified knee: Secondary | ICD-10-CM

## 2011-12-06 DIAGNOSIS — M25562 Pain in left knee: Secondary | ICD-10-CM

## 2011-12-06 DIAGNOSIS — Z0000 Encounter for general adult medical examination without abnormal findings: Secondary | ICD-10-CM

## 2011-12-06 DIAGNOSIS — E785 Hyperlipidemia, unspecified: Secondary | ICD-10-CM

## 2011-12-06 LAB — CBC WITH DIFFERENTIAL/PLATELET
Eosinophils Absolute: 0.1 10*3/uL (ref 0.0–0.7)
HCT: 36.3 % (ref 36.0–46.0)
Lymphs Abs: 1.6 10*3/uL (ref 0.7–4.0)
MCHC: 33.5 g/dL (ref 30.0–36.0)
MCV: 90.3 fl (ref 78.0–100.0)
Monocytes Absolute: 0.7 10*3/uL (ref 0.1–1.0)
Neutrophils Relative %: 65.7 % (ref 43.0–77.0)
Platelets: 229 10*3/uL (ref 150.0–400.0)
RDW: 14.2 % (ref 11.5–14.6)

## 2011-12-06 LAB — LIPID PANEL
Cholesterol: 196 mg/dL (ref 0–200)
LDL Cholesterol: 104 mg/dL — ABNORMAL HIGH (ref 0–99)
Total CHOL/HDL Ratio: 2

## 2011-12-06 LAB — BASIC METABOLIC PANEL
BUN: 17 mg/dL (ref 6–23)
Chloride: 102 mEq/L (ref 96–112)
Potassium: 3.7 mEq/L (ref 3.5–5.1)
Sodium: 137 mEq/L (ref 135–145)

## 2011-12-06 LAB — URINALYSIS, ROUTINE W REFLEX MICROSCOPIC
Bilirubin Urine: NEGATIVE
Ketones, ur: NEGATIVE
Nitrite: NEGATIVE

## 2011-12-06 LAB — TSH: TSH: 1.3 u[IU]/mL (ref 0.35–5.50)

## 2011-12-06 LAB — HEPATIC FUNCTION PANEL
ALT: 11 U/L (ref 0–35)
AST: 25 U/L (ref 0–37)
Alkaline Phosphatase: 55 U/L (ref 39–117)
Bilirubin, Direct: 0.1 mg/dL (ref 0.0–0.3)
Total Bilirubin: 0.6 mg/dL (ref 0.3–1.2)

## 2011-12-06 MED ORDER — ALPRAZOLAM 0.5 MG PO TABS: 0.5000 mg | ORAL_TABLET | Freq: Two times a day (BID) | ORAL | Status: DC | PRN

## 2011-12-06 NOTE — Patient Instructions (Signed)
Continue all other medications as before You are given the alprazolam refill today Please have the pharmacy call with any refills you may need. You will be contacted regarding the referral for: orthopedic for the left knee Please continue to walk with the cane to avoid falls Please go to LAB in the Basement for the blood and/or urine tests to be done today You will be contacted by phone if any changes need to be made immediately.  Otherwise, you will receive a letter about your results with an explanation. You are otherwise up to date with prevention measures Please return in 6 month, or sooner if needed

## 2011-12-07 ENCOUNTER — Encounter: Payer: Self-pay | Admitting: Internal Medicine

## 2011-12-07 NOTE — Assessment & Plan Note (Signed)
Mild to mod, c/w DJD , for ortho referral, Continue all other medications as before

## 2011-12-07 NOTE — Assessment & Plan Note (Signed)

## 2011-12-07 NOTE — Progress Notes (Signed)
Subjective:    Patient ID: Elizabeth Peterson, female    DOB: Aug 17, 1919, 78 y.o.   MRN: [id number]  HPI  Here for wellness and f/u;  Overall doing ok;  Pt denies CP, worsening SOB, DOE, wheezing, orthopnea, PND, worsening LE edema, palpitations, dizziness or syncope.  Pt denies neurological change such as new Headache, facial or extremity weakness.  Pt denies polydipsia, polyuria, or low sugar symptoms. Pt states overall good compliance with treatment and medications, good tolerability, and trying to follow lower cholesterol diet.  Pt denies worsening depressive symptoms, suicidal ideation or panic. No fever, wt loss, night sweats, loss of appetite, or other constitutional symptoms.  Pt states good ability with ADL's, low to mod fall risk due to left knee pain with recurrent sweling but no falls or giveaway, home safety reviewed and adequate, no significant changes in hearing or vision,.  Needs xanax refill Past Medical History  Diagnosis Date  . HYPERLIPIDEMIA 04/16/2007  . ANEMIA-NOS 04/16/2007  . ANXIETY 04/16/2007  . DEPRESSION 09/03/2007  . HYPERTENSION 04/16/2007  . ATHEROSLERO NATV ART EXTREM W/INTERMIT CLAUDICAT 06/05/2010  . ALLERGIC RHINITIS 02/27/2009  . PNEUMONIA, COMMUNITY ACQUIRED, PNEUMOCOCCAL 12/07/2008  . ASTHMA 09/03/2007  . GERD 04/16/2007  . DIVERTICULOSIS, COLON 09/03/2007  . MENOPAUSAL DISORDER 04/18/2008  . OSTEOARTHRITIS, KNEE, LEFT 11/24/2009  . JOINT EFFUSION, LEFT KNEE 02/27/2009  . KNEE PAIN, BILATERAL 06/01/2010  . LOW BACK PAIN 04/16/2007  . LEG PAIN, LEFT 06/01/2010  . WEIGHT LOSS 07/25/2008  . RASH-NONVESICULAR 07/25/2008  . Abdominal pain, generalized 09/03/2007  . BREAST CANCER, HX OF 04/16/2007  . COLONIC POLYPS, HX OF 09/03/2007   Past Surgical History  Procedure Date  . Abdominal hysterectomy 1966  . Tubal ligation   . S/p left mastectomy 2011    reports that she has never smoked. She has never used smokeless tobacco. She reports that she does not drink alcohol or use  illicit drugs. family history includes Cancer in her sister; Diabetes in her other; and Hypertension in her other. Allergies  Allergen Reactions  . Amlodipine Besylate     REACTION: rash and itch  . Penicillins   . Sulfonamide Derivatives    Current Outpatient Prescriptions on File Prior to Visit  Medication Sig Dispense Refill  . acetaminophen-codeine (TYLENOL #3) 300-30 MG per tablet Take 1 tablet by mouth every 6 (six) hours as needed.  120 tablet  5  . albuterol (VENTOLIN HFA) 108 (90 BASE) MCG/ACT inhaler Inhale 2 puffs into the lungs 4 (four) times daily.        Marland Kitchen alendronate (FOSAMAX) 70 MG tablet Take 70 mg by mouth every 7 (seven) days. Take with a full glass of water on an empty stomach.       Marland Kitchen amLODipine (NORVASC) 10 MG tablet Take 1 tablet (10 mg total) by mouth daily.  30 tablet  11  . aspirin 81 MG EC tablet Take 81 mg by mouth daily.        . benazepril (LOTENSIN) 20 MG tablet Take 20 mg by mouth daily.        Marland Kitchen omeprazole (PRILOSEC) 20 MG capsule Take 20 mg by mouth 2 (two) times daily.       . Polyethylene Glycol 3350 POWD Use as directed       . pravastatin (PRAVACHOL) 40 MG tablet Take 1 tablet (40 mg total) by mouth every evening.  90 tablet  3  . sertraline (ZOLOFT) 25 MG tablet Take 1 tablet (25 mg total) by mouth  daily.  30 tablet  5  . triamcinolone cream (KENALOG) 0.5 % APPLY TO AFFECTED AREA TWICE A DAY  30 g  2   Review of Systems Review of Systems  Constitutional: Negative for diaphoresis, activity change, appetite change and unexpected weight change.  HENT: Negative for hearing loss, ear pain, facial swelling, mouth sores and neck stiffness.   Eyes: Negative for pain, redness and visual disturbance.  Respiratory: Negative for shortness of breath and wheezing.   Cardiovascular: Negative for chest pain and palpitations.  Gastrointestinal: Negative for diarrhea, blood in stool, abdominal distention and rectal pain.  Genitourinary: Negative for hematuria,  flank pain and decreased urine volume.  Musculoskeletal: Negative for myalgias and joint swelling.  Skin: Negative for color change and wound.  Neurological: Negative for syncope and numbness.  Hematological: Negative for adenopathy.  Psychiatric/Behavioral: Negative for hallucinations, self-injury, decreased concentration and agitation.      Objective:   Physical Exam BP 154/60  Pulse 84  Temp(Src) 98.4 F (36.9 C) (Oral)  Ht 5' (1.524 m)  Wt 102 lb 8 oz (46.494 kg)  BMI 20.02 kg/m2  SpO2 97% Physical Exam  VS noted Constitutional: Pt is oriented to person, place, and time. Appears well-developed and well-nourished.  HENT:  Head: Normocephalic and atraumatic.  Right Ear: External ear normal.  Left Ear: External ear normal.  Nose: Nose normal.  Mouth/Throat: Oropharynx is clear and moist.  Eyes: Conjunctivae and EOM are normal. Pupils are equal, round, and reactive to light.  Neck: Normal range of motion. Neck supple. No JVD present. No tracheal deviation present.  Cardiovascular: Normal rate, regular rhythm, normal heart sounds and intact distal pulses.   Pulmonary/Chest: Effort normal and breath sounds normal.  Abdominal: Soft. Bowel sounds are normal. There is no tenderness.  Musculoskeletal: Normal range of motion. Exhibits no edema.  Lymphadenopathy:  Has no cervical adenopathy.  Neurological: Pt is alert and oriented to person, place, and time. Pt has normal reflexes. No cranial nerve deficit.  Skin: Skin is warm and dry. No rash noted.  Psychiatric:  Has  normal mood and affect. Behavior is normal. 1+ nervous    Assessment & Plan:

## 2011-12-09 ENCOUNTER — Telehealth: Payer: Self-pay

## 2011-12-09 ENCOUNTER — Telehealth: Payer: Self-pay | Admitting: Neurology

## 2011-12-09 MED ORDER — SERTRALINE HCL 50 MG PO TABS: 50.0000 mg | ORAL_TABLET | Freq: Every day | ORAL | Status: DC

## 2011-12-09 NOTE — Telephone Encounter (Signed)
Called the daughter but unable to connect and leave message. Called the home number and spoke to the patient and informed of medication change.

## 2011-12-09 NOTE — Telephone Encounter (Signed)
Pt's daughter called stating that Dr Modesto Charon D/C's Xanax due to severe side effect pt was having and started her on Sertraline 11/14/2011 but pt was prescibed Xanax again at her last OV with Dr Jonny Ruiz and daughter is upset by this. She is requesting MD advise on the reason medication was prescribed and whether MD was aware of Dr Nash Dimmer decision, please advise.

## 2011-12-09 NOTE — Telephone Encounter (Signed)
This was done inadvertently as pt mentioned need for further tx;  Chart reviewed again;  Ok to d/c the xanax prn, and incr the sertraline to 50 mg per day - done per emr

## 2011-12-09 NOTE — Telephone Encounter (Signed)
Per pt's daughter, Dr. Jonny Ruiz gave pt another prescription for xanax. Pt's daughter states that Dr. Modesto Charon had previously removed the pt from this medication so she wants to find out if mother is supposed to be taking this prescription. Pt's daughter is also going to call Dr. Raphael Gibney office to find out why the rx was given again. Please call her back at the home number listed.

## 2011-12-10 NOTE — Telephone Encounter (Signed)
[name] returned my call. She reports that her mother was all set to go and pick up the Xanax from the pharmacy as prescribed by Dr. Jonny Ruiz but she called and explained the negative side effects to the pharmacist and the medication was not filled. [name] was going to call Dr. Raphael Gibney office and leave a message regarding the Xanax. No other issues or concerns at this time.

## 2011-12-10 NOTE — Telephone Encounter (Signed)
Left a message for [name] at her work number.

## 2011-12-10 NOTE — Telephone Encounter (Signed)
She should not be taking Xanax or any other benzodiazepine as it clearly caused delirium in her and exacerbated her condition.  Jan - could you let her know.

## 2011-12-22 ENCOUNTER — Other Ambulatory Visit: Payer: Self-pay | Admitting: Internal Medicine

## 2012-02-12 ENCOUNTER — Encounter: Payer: Self-pay | Admitting: Neurology

## 2012-02-12 ENCOUNTER — Ambulatory Visit (INDEPENDENT_AMBULATORY_CARE_PROVIDER_SITE_OTHER): Payer: Medicare Other | Admitting: Neurology

## 2012-02-12 VITALS — BP 126/68 | HR 76 | Wt 99.0 lb

## 2012-02-12 DIAGNOSIS — R413 Other amnesia: Secondary | ICD-10-CM

## 2012-02-12 MED ORDER — SERTRALINE HCL 50 MG PO TABS: 100.0000 mg | ORAL_TABLET | Freq: Every day | ORAL | Status: DC

## 2012-02-12 NOTE — Patient Instructions (Signed)
please start Vitamin b12 a day.

## 2012-02-12 NOTE — Progress Notes (Signed)
Dear Dr. Jonny Ruiz,  I saw  Sharene Skeans back in [facility] for her problem with memory disorder, idiopathic peripheral neuropathy and gait instability.  As you may recall, she is a 78 y.o. year old female with a history of anxiety and depression who I originally saw for memory problems and confusion that I felt was likely a combination of an underlying dementia along with drug induced confusion secondary to Xanax use.  Stopping the Xanax greatly helped her confusion.  I had also started her on sertraline which according to her daughter previously had helped her anxiety.  She reports today that she is still having significant anxiety.  According to her sister her accompanies her and lives with her she is not having as much problems with confusion or agitation.  Ms. Fisk would like to restart Xanax because she feels it helped her more than the sertraline.    She continues to complain of cramping pain in her legs.  I feel this may be secondary to a peripheral neuropathy.  Workup of the peripheral neuropathy revealed a borderline low B12.  I have not gotten a NCS.    Medical history, social history, and family history were reviewed and have not changed since the last clinic visit.  Current Outpatient Prescriptions on File Prior to Visit  Medication Sig Dispense Refill  . acetaminophen-codeine (TYLENOL #3) 300-30 MG per tablet Take 1 tablet by mouth every 6 (six) hours as needed.  120 tablet  5  . albuterol (VENTOLIN HFA) 108 (90 BASE) MCG/ACT inhaler Inhale 2 puffs into the lungs 4 (four) times daily.        Marland Kitchen alendronate (FOSAMAX) 70 MG tablet Take 70 mg by mouth every 7 (seven) days. Take with a full glass of water on an empty stomach.       Marland Kitchen aspirin 81 MG EC tablet Take 81 mg by mouth daily.        . benazepril (LOTENSIN) 20 MG tablet Take 20 mg by mouth daily.        Marland Kitchen omeprazole (PRILOSEC) 20 MG capsule Take 20 mg by mouth 2 (two) times daily.       . Polyethylene Glycol 3350  POWD Use as directed       . pravastatin (PRAVACHOL) 40 MG tablet Take 1 tablet (40 mg total) by mouth every evening.  90 tablet  3  . triamcinolone cream (KENALOG) 0.5 % APPLY TO AFFECTED AREA TWICE A DAY  30 g  2  . DISCONTD: sertraline (ZOLOFT) 50 MG tablet Take 1 tablet (50 mg total) by mouth daily.  90 tablet  3  . amLODipine (NORVASC) 10 MG tablet Take 1 tablet (10 mg total) by mouth daily.  30 tablet  11    Allergies  Allergen Reactions  . Amlodipine Besylate     REACTION: rash and itch  . Penicillins   . Sulfonamide Derivatives     ROS:  13 systems were reviewed and are notable for bilateral knee pain and difficulty walking.  All other review of systems are unremarkable.  Exam: . Filed Vitals:   02/12/12 1027  BP: 126/68  Pulse: 76  Weight: 99 lb (44.906 kg)    In general, well appearing older women.    Mental status:   4/5 time; 4/5 place; 5/5 WORLD; 1/3 three word recall - significant improvement from the first visit.    Motor:  Normal bulk and tone, no drift and 5/5 muscle strength bilaterally except 4/5 at HF.  Reflexes:  2+ thoughout, except absent at ankles.  Gait:  Antalgic, slightly stooped, mildly wide based.  Not clearly ataxic.  Romberg negative.  Impression/Recommendations:  1.  Memory disorder - I think overall this has improved since I first met her.  I suspect the Xanax was playing a big role here.  I would not advocate a memory medication for now. 2.  Anxiety - Clearly this needs to be effectively treated.  I have increased her sertraline to 100mg  daily.  I would not advise using a benzodiazepine because of the problems with confusion she has experienced in the past. 3.  Gait - Her gait is ok given her age, and her clinically diagnosed peripheral neuropathy.  If she starts having falls again gait therapy may make sense, but these have reduced since she was taken off of Xanax. 4.  Cramping in legs - ?peripheral neuropathy - In the future we may  consider gabapentin at night, but I don't want to add this while we increase Zoloft.  I will also consider a NCS at a later date.  We will see the patient back in 3 months.  Lupita Raider Modesto Charon, MD [facility], [place]

## 2012-03-25 ENCOUNTER — Other Ambulatory Visit: Payer: Self-pay | Admitting: Internal Medicine

## 2012-05-14 ENCOUNTER — Encounter: Payer: Self-pay | Admitting: Neurology

## 2012-05-14 ENCOUNTER — Ambulatory Visit (INDEPENDENT_AMBULATORY_CARE_PROVIDER_SITE_OTHER): Payer: Medicare Other | Admitting: Neurology

## 2012-05-14 VITALS — BP 174/80 | HR 76 | Wt 100.0 lb

## 2012-05-14 DIAGNOSIS — R413 Other amnesia: Secondary | ICD-10-CM

## 2012-05-14 NOTE — Patient Instructions (Addendum)
Allergies:  Sulfa Amlodapine Penicillin

## 2012-05-14 NOTE — Progress Notes (Signed)
Dear Dr. Jonny Ruiz,   I saw Elizabeth Peterson back in [facility] for her problem with memory disorder, idiopathic peripheral neuropathy and gait instability. As you may recall, she is a 78 y.o. year old female with a history of anxiety and depression who I originally saw for memory problems and confusion that I felt was likely a combination of an underlying dementia along with drug induced confusion secondary to Xanax use. Stopping the Xanax greatly helped her confusion. I had also started her on sertraline which according to her daughter previously had helped her anxiety.   At her last appointment she had still complained of anxiety.  As a result I increased her sertraline to 100mg  daily.  She feels much better now.  She seems to be doing all right with respect to her memory.  Her sister accompanies her denies significant confusion.  She did have one fall, but did not hurt herself.  In the past she complained of cramping pain in the legs.  PN labs had shown a borderline low b12.  I had not gotten a NCS.  However, today she is complaining more of left knee pain.   Medical history, social history, and family history were reviewed and have not changed since the last clinic visit.  Current Outpatient Prescriptions on File Prior to Visit  Medication Sig Dispense Refill  . acetaminophen-codeine (TYLENOL #3) 300-30 MG per tablet Take 1 tablet by mouth every 6 (six) hours as needed.  120 tablet  5  . albuterol (VENTOLIN HFA) 108 (90 BASE) MCG/ACT inhaler Inhale 2 puffs into the lungs 4 (four) times daily.        Marland Kitchen alendronate (FOSAMAX) 70 MG tablet Take 70 mg by mouth every 7 (seven) days. Take with a full glass of water on an empty stomach.       Marland Kitchen aspirin 81 MG EC tablet Take 81 mg by mouth daily.        . benazepril (LOTENSIN) 20 MG tablet Take 20 mg by mouth daily.        Marland Kitchen omeprazole (PRILOSEC) 20 MG capsule Take 20 mg by mouth 2 (two) times daily.       . Polyethylene Glycol 3350 POWD Use  as directed       . pravastatin (PRAVACHOL) 40 MG tablet Take 1 tablet (40 mg total) by mouth every evening.  90 tablet  3  . sertraline (ZOLOFT) 50 MG tablet Take 2 tablets (100 mg total) by mouth daily.  180 tablet  6  . triamcinolone cream (KENALOG) 0.5 % APPLY TO AFFECTED AREA TWICE A DAY  30 g  2  . amLODipine (NORVASC) 10 MG tablet Take 1 tablet (10 mg total) by mouth daily.  30 tablet  11    Allergies  Allergen Reactions  . Amlodipine Besylate     REACTION: rash and itch  . Penicillins   . Sulfonamide Derivatives     ROS:  13 systems were reviewed and are notable for mild gait instability.  All other review of systems are unremarkable.  Exam: . Filed Vitals:   05/14/12 1141  BP: 174/80  Pulse: 76  Weight: 100 lb (45.36 kg)    In general, well appearing women.  Mental status:   3/5 time; 3/5 place; 0/3 recall, rest full 20/27  MSK:  mild tenderness of left knee.  Motor:  Normal bulk and tone, no drift and 5/5 muscle strength bilaterally.  Reflexes:  2+ thoughout, absent ankles.  Coordination:  Normal  finger to nose  Gait:  Antalgic, sligtly stooped, mildly wide based.  Impression/Recommendations:  1.  Memory loss - likely mild dementia.  However, at this time I would defer from starting a cholinesterase inhibitor as I don't think she is likely to derive much benefit from it.  Getting her off the Xanax clearly was the biggest issue and her anxiety is much better on higher doses of sertraline.  However, if you think her dementia worsens then you could start Aricept. 2.  Gait instability - mild, likely related to antalgia as well as peripheral neuropathy.  No further workup needed.  May make sense to recheck a B12 later.  May benefit from gait therapy in the future. 3.  Knee pain - she will follow up with you about this.  The patient will follow up with yourself.  Lupita Raider Modesto Charon, MD [facility], [place]

## 2012-05-24 ENCOUNTER — Other Ambulatory Visit: Payer: Self-pay | Admitting: Internal Medicine

## 2012-06-10 ENCOUNTER — Ambulatory Visit: Payer: Medicare Other | Admitting: Internal Medicine

## 2012-06-17 ENCOUNTER — Ambulatory Visit (INDEPENDENT_AMBULATORY_CARE_PROVIDER_SITE_OTHER): Payer: Medicare Other | Admitting: Internal Medicine

## 2012-06-17 ENCOUNTER — Encounter: Payer: Self-pay | Admitting: Internal Medicine

## 2012-06-17 VITALS — BP 142/72 | HR 76 | Temp 97.4°F | Ht 60.0 in | Wt 100.2 lb

## 2012-06-17 DIAGNOSIS — Z23 Encounter for immunization: Secondary | ICD-10-CM

## 2012-06-17 DIAGNOSIS — F0390 Unspecified dementia without behavioral disturbance: Secondary | ICD-10-CM

## 2012-06-17 DIAGNOSIS — G629 Polyneuropathy, unspecified: Secondary | ICD-10-CM

## 2012-06-17 DIAGNOSIS — F411 Generalized anxiety disorder: Secondary | ICD-10-CM

## 2012-06-17 DIAGNOSIS — G609 Hereditary and idiopathic neuropathy, unspecified: Secondary | ICD-10-CM

## 2012-06-17 DIAGNOSIS — I10 Essential (primary) hypertension: Secondary | ICD-10-CM

## 2012-06-17 HISTORY — DX: Polyneuropathy, unspecified: G62.9

## 2012-06-17 HISTORY — DX: Unspecified dementia, unspecified severity, without behavioral disturbance, psychotic disturbance, mood disturbance, and anxiety: F03.90

## 2012-06-17 MED ORDER — GABAPENTIN 100 MG PO CAPS: 100.0000 mg | ORAL_CAPSULE | Freq: Three times a day (TID) | ORAL | Status: AC

## 2012-06-17 NOTE — Assessment & Plan Note (Addendum)
stable overall by hx and exam, , and pt to continue medical treatment as before, will need to avoid further benzo use due to confusion

## 2012-06-17 NOTE — Assessment & Plan Note (Addendum)
With pain below knees  - for neurontin 100 tid, for higher strength if not improved, or consider d/c pravastatin trial if not improved to see if might be MSK

## 2012-06-17 NOTE — Patient Instructions (Addendum)
You had the flu shot today Take all new medications as prescribed  - the gabapentin for nerve pain below the knees OK to stop nabumetone as this can cause stomach pain Continue all other medications as before, except ok to stop the Fosamax Please return in 6 months, with lab work done 3-5 days ahead

## 2012-06-20 NOTE — Progress Notes (Signed)
Subjective:    Patient ID: Elizabeth Peterson, female    DOB: 11/21/1918, 78 y.o.   MRN: [id number]  HPI  Here to f/u; overall doing ok,  Pt denies chest pain, increased sob or doe, wheezing, orthopnea, PND, increased LE swelling, palpitations, dizziness or syncope.  Pt denies new neurological symptoms such as new headache, or facial or extremity weakness or numbness, but does have worsening LE burning neuropathic type pain to the LE's below the knees, nonclaudication.     Pt denies polydipsia, polyuria, .  Pt states overall good compliance with meds, trying to follow lower cholesterol diet, wt overall stable.  Nsaid not helping and may be causing some upper GI upset.  Tolerating the pravastatin 40 mg without constipation, slowed thinking, myalgias.  Has not taken fosamax for many months, no longer wants to take a med once weekly, too hard to remember.  Denies worsening depressive symptoms, suicidal ideation, or panic, though has ongoing anxiety.  Due for flu shot today.  No recent falls.   Pt denies fever, wt loss, night sweats, loss of appetite, or other constitutional symptoms  Dementia overall stable symptomatically  and not assoc with behavioral changes such as hallucinations, paranoia, or agitation.  Past Medical History  Diagnosis Date  . HYPERLIPIDEMIA 04/16/2007  . ANEMIA-NOS 04/16/2007  . ANXIETY 04/16/2007  . DEPRESSION 09/03/2007  . HYPERTENSION 04/16/2007  . ATHEROSLERO NATV ART EXTREM W/INTERMIT CLAUDICAT 06/05/2010  . ALLERGIC RHINITIS 02/27/2009  . PNEUMONIA, COMMUNITY ACQUIRED, PNEUMOCOCCAL 12/07/2008  . ASTHMA 09/03/2007  . GERD 04/16/2007  . DIVERTICULOSIS, COLON 09/03/2007  . MENOPAUSAL DISORDER 04/18/2008  . OSTEOARTHRITIS, KNEE, LEFT 11/24/2009  . JOINT EFFUSION, LEFT KNEE 02/27/2009  . KNEE PAIN, BILATERAL 06/01/2010  . LOW BACK PAIN 04/16/2007  . LEG PAIN, LEFT 06/01/2010  . WEIGHT LOSS 07/25/2008  . RASH-NONVESICULAR 07/25/2008  . Abdominal pain, generalized 09/03/2007  . BREAST CANCER,  HX OF 04/16/2007  . COLONIC POLYPS, HX OF 09/03/2007  . Dementia 06/17/2012    mild  . Peripheral neuropathy 06/17/2012   Past Surgical History  Procedure Date  . Abdominal hysterectomy 1966  . Tubal ligation   . S/p left mastectomy 2011    reports that she has never smoked. She has never used smokeless tobacco. She reports that she does not drink alcohol or use illicit drugs. family history includes Cancer in her sister; Diabetes in her other; and Hypertension in her other. Allergies  Allergen Reactions  . Amlodipine Besylate     REACTION: rash and itch  . Benzodiazepines Other (See Comments)    Hx of overuse/memory issue  . Penicillins   . Sulfonamide Derivatives    Current Outpatient Prescriptions on File Prior to Visit  Medication Sig Dispense Refill  . acetaminophen-codeine (TYLENOL #3) 300-30 MG per tablet Take 1 tablet by mouth every 6 (six) hours as needed.  120 tablet  5  . aspirin 81 MG EC tablet Take 81 mg by mouth daily.        Marland Kitchen omeprazole (PRILOSEC) 20 MG capsule Take 20 mg by mouth 2 (two) times daily.       . Polyethylene Glycol 3350 POWD Use as directed       . pravastatin (PRAVACHOL) 40 MG tablet TAKE 1 TABLET BY MOUTH EVERY EVENING.  90 tablet  1  . sertraline (ZOLOFT) 50 MG tablet Take 2 tablets (100 mg total) by mouth daily.  180 tablet  6  . albuterol (VENTOLIN HFA) 108 (90 BASE) MCG/ACT inhaler Inhale 2 puffs  into the lungs 4 (four) times daily.        Marland Kitchen amLODipine (NORVASC) 10 MG tablet Take 1 tablet (10 mg total) by mouth daily.  30 tablet  11  . benazepril (LOTENSIN) 20 MG tablet Take 20 mg by mouth daily.        Marland Kitchen gabapentin (NEURONTIN) 100 MG capsule Take 1 capsule (100 mg total) by mouth 3 (three) times daily.  90 capsule  5   Review of Systems  Constitutional: Negative for diaphoresis and unexpected weight change.  HENT: Negative for tinnitus.   Eyes: Negative for photophobia and visual disturbance.  Respiratory: Negative for choking and stridor.     Gastrointestinal: Negative for vomiting and blood in stool.  Genitourinary: Negative for hematuria and decreased urine volume.  Musculoskeletal: Negative for gait problem.  Skin: Negative for color change and wound.  Neurological: Negative for tremors and numbness.  Psychiatric/Behavioral: Negative for decreased concentration. The patient is not hyperactive.       Objective:   Physical Exam BP 142/72  Pulse 76  Temp 97.4 F (36.3 C) (Oral)  Ht 5' (1.524 m)  Wt 100 lb 4 oz (45.473 kg)  BMI 19.58 kg/m2  SpO2 97% Physical Exam  VS noted Constitutional: Pt appears well-developed and well-nourished.  HENT: Head: Normocephalic.  Right Ear: External ear normal.  Left Ear: External ear normal.  Eyes: Conjunctivae and EOM are normal. Pupils are equal, round, and reactive to light.  Neck: Normal range of motion. Neck supple.  Cardiovascular: Normal rate and regular rhythm.   Pulmonary/Chest: Effort normal and breath sounds normal.  Abd:  Soft, NT, non-distended, + BS Neurological: Pt is alert.  Skin: Skin is warm. No erythema.  Psychiatric: Pt behavior is normal, cognitive c/w dementia    Assessment & Plan:

## 2012-06-21 ENCOUNTER — Other Ambulatory Visit: Payer: Self-pay | Admitting: Internal Medicine

## 2012-06-21 ENCOUNTER — Encounter: Payer: Self-pay | Admitting: Internal Medicine

## 2012-06-21 NOTE — Assessment & Plan Note (Signed)
stable overall by hx and exam, most recent data reviewed with pt, and pt to continue medical treatment as before BP Readings from Last 3 Encounters:  06/17/12 142/72  05/14/12 174/80  02/12/12 126/68

## 2012-06-21 NOTE — Assessment & Plan Note (Signed)
stable overall by hx and exam, , and pt to continue medical treatment as before, declines any change in tx at this time

## 2012-07-24 ENCOUNTER — Telehealth: Payer: Self-pay | Admitting: Internal Medicine

## 2012-07-24 NOTE — Telephone Encounter (Signed)
Caller: [name]/daughter; Patient Name: Elizabeth Peterson; PCP: Oliver Barre ; [facility] Call[facility] Phone Number: [phone] Daughter/[name] is requesting that office sends daughter and patient appt reminders  in the mail.  Daughter's address is   [address] [place], [place]  [address].

## 2012-07-30 ENCOUNTER — Telehealth: Payer: Self-pay | Admitting: Internal Medicine

## 2012-07-30 NOTE — Telephone Encounter (Signed)
Caller: /Child; Patient Name: Elizabeth Peterson; PCP: Rene Paci (Adults only); Best Callback Phone Number: [phone]   [name] her daughter states she told her a couple weeks ago she had gotten a letter to schedule a mammogram.  She was going to schedule for her but now she can't find the letter and she doesn't know where she needs to call.  I did give her the number for [facility] and also [facility] to see if she is registered either place.  She will call back if can't find where

## 2012-08-03 NOTE — Telephone Encounter (Signed)
Mailed appointment reminder.

## 2012-11-18 ENCOUNTER — Other Ambulatory Visit: Payer: Self-pay | Admitting: Internal Medicine

## 2012-11-30 ENCOUNTER — Other Ambulatory Visit (INDEPENDENT_AMBULATORY_CARE_PROVIDER_SITE_OTHER): Payer: Medicare Other

## 2012-11-30 ENCOUNTER — Ambulatory Visit (INDEPENDENT_AMBULATORY_CARE_PROVIDER_SITE_OTHER): Payer: Medicare Other | Admitting: Internal Medicine

## 2012-11-30 ENCOUNTER — Encounter: Payer: Self-pay | Admitting: Internal Medicine

## 2012-11-30 VITALS — BP 152/62 | HR 75 | Temp 98.1°F | Wt 100.0 lb

## 2012-11-30 DIAGNOSIS — I10 Essential (primary) hypertension: Secondary | ICD-10-CM

## 2012-11-30 DIAGNOSIS — M79605 Pain in left leg: Secondary | ICD-10-CM

## 2012-11-30 DIAGNOSIS — F411 Generalized anxiety disorder: Secondary | ICD-10-CM

## 2012-11-30 DIAGNOSIS — M25562 Pain in left knee: Secondary | ICD-10-CM

## 2012-11-30 DIAGNOSIS — G4700 Insomnia, unspecified: Secondary | ICD-10-CM | POA: Insufficient documentation

## 2012-11-30 DIAGNOSIS — R05 Cough: Secondary | ICD-10-CM

## 2012-11-30 DIAGNOSIS — M25569 Pain in unspecified knee: Secondary | ICD-10-CM

## 2012-11-30 DIAGNOSIS — G629 Polyneuropathy, unspecified: Secondary | ICD-10-CM

## 2012-11-30 DIAGNOSIS — E785 Hyperlipidemia, unspecified: Secondary | ICD-10-CM

## 2012-11-30 DIAGNOSIS — M79609 Pain in unspecified limb: Secondary | ICD-10-CM

## 2012-11-30 DIAGNOSIS — M79604 Pain in right leg: Secondary | ICD-10-CM

## 2012-11-30 DIAGNOSIS — G609 Hereditary and idiopathic neuropathy, unspecified: Secondary | ICD-10-CM

## 2012-11-30 DIAGNOSIS — R059 Cough, unspecified: Secondary | ICD-10-CM

## 2012-11-30 DIAGNOSIS — F0390 Unspecified dementia without behavioral disturbance: Secondary | ICD-10-CM

## 2012-11-30 LAB — CBC WITH DIFFERENTIAL/PLATELET
Basophils Absolute: 0 10*3/uL (ref 0.0–0.1)
Eosinophils Absolute: 0.3 10*3/uL (ref 0.0–0.7)
HCT: 30.2 % — ABNORMAL LOW (ref 36.0–46.0)
Lymphs Abs: 1.1 10*3/uL (ref 0.7–4.0)
MCHC: 34 g/dL (ref 30.0–36.0)
MCV: 87.8 fl (ref 78.0–100.0)
Monocytes Absolute: 0.7 10*3/uL (ref 0.1–1.0)
Neutrophils Relative %: 63.9 % (ref 43.0–77.0)
Platelets: 245 10*3/uL (ref 150.0–400.0)
RDW: 13.9 % (ref 11.5–14.6)
WBC: 5.8 10*3/uL (ref 4.5–10.5)

## 2012-11-30 LAB — BASIC METABOLIC PANEL
BUN: 20 mg/dL (ref 6–23)
Chloride: 107 mEq/L (ref 96–112)
GFR: 66.72 mL/min (ref >60.00)
Glucose, Bld: 88 mg/dL (ref 70–99)
Potassium: 4.4 mEq/L (ref 3.5–5.1)
Sodium: 138 mEq/L (ref 135–145)

## 2012-11-30 LAB — URINALYSIS, ROUTINE W REFLEX MICROSCOPIC
Bilirubin Urine: NEGATIVE
Ketones, ur: NEGATIVE
Nitrite: NEGATIVE
Urobilinogen, UA: 0.2 (ref 0.0–1.0)
pH: 6 (ref 5.0–8.0)

## 2012-11-30 LAB — HEPATIC FUNCTION PANEL
ALT: 8 U/L (ref 0–35)
AST: 19 U/L (ref 0–37)
Alkaline Phosphatase: 74 U/L (ref 39–117)
Total Bilirubin: 0.7 mg/dL (ref 0.3–1.2)

## 2012-11-30 MED ORDER — ZOLPIDEM TARTRATE 5 MG PO TABS: 5.0000 mg | ORAL_TABLET | Freq: Every evening | ORAL | Status: DC | PRN

## 2012-11-30 MED ORDER — LOSARTAN POTASSIUM 100 MG PO TABS: 100.0000 mg | ORAL_TABLET | Freq: Every day | ORAL | Status: DC

## 2012-11-30 NOTE — Assessment & Plan Note (Signed)
stable overall by history and exam, recent data reviewed with pt, and pt to continue medical treatment as before,  to f/u any worsening symptoms or concerns, declines further tx such as aricep

## 2012-11-30 NOTE — Assessment & Plan Note (Signed)
stable overall by history and exam, recent data reviewed with pt, and pt to continue medical treatment as before,  to f/u any worsening symptoms or concerns Lab Results  Component Value Date   WBC 7.0 12/06/2011   HGB 12.2 12/06/2011   HCT 36.3 12/06/2011   PLT 229.0 12/06/2011   GLUCOSE 99 12/06/2011   CHOL 196 12/06/2011   TRIG 61.0 12/06/2011   HDL 79.80 12/06/2011   LDLDIRECT 141.4 06/04/2011   LDLCALC 104* 12/06/2011   ALT 11 12/06/2011   AST 25 12/06/2011   NA 137 12/06/2011   K 3.7 12/06/2011   CL 102 12/06/2011   CREATININE 1.0 12/06/2011   BUN 17 12/06/2011   CO2 25 12/06/2011   TSH 1.30 12/06/2011   INR 1.0 ratio 08/25/2009   HGBA1C 5.3 10/01/2011

## 2012-11-30 NOTE — Assessment & Plan Note (Signed)
stable overall by history and exam, recent data reviewed with pt, and pt to continue medical treatment as before,  to f/u any worsening symptoms or concerns BP Readings from Last 3 Encounters:  11/30/12 152/62  06/17/12 142/72  05/14/12 174/80   To change benazepril to losartan given recent cough

## 2012-11-30 NOTE — Assessment & Plan Note (Signed)
Hard to quantify, but with leg weakness and advanced age, will d/c statin as not clear of benefit at her age

## 2012-11-30 NOTE — Progress Notes (Signed)
Subjective:    Patient ID: Elizabeth Peterson, female    DOB: 01/03/1919, 79 y.o.   MRN: [id number]  HPI  Here with daughter, overall doing ok, but does c/o pain to both knees, worse on the left with swelling recurrent with pain in the past few months, worse after walking more, despite good compliacne with nsaid.  Does mention she really doesn't take the gabapentin tid every day, once per day mostly and does help the neuropathic pain below the knees, but just forgets to take it sometimes. Has run out of the benazeprill and forgot to call for refill, as [facility] most often lets her know when she is due for refills.  Does also have cough nonprod recent, without overt allergy/asthma/reflux or known NAEB.  Has difficulty sleeping but is hoping for med that wont cause problems in light of her dementia.  Has increased risk for fall she thinks due to leg and knee pain recently but has feels she gets by her 4 pronged cane.  No falls yet.  Has some decreased upper leg strength she thinks due to being less active, but no worsening off balance or holding onto walls.  Pt denies chest pain, increased sob or doe, wheezing, orthopnea, PND, increased LE swelling, palpitations, dizziness or syncope.  Dementia overall stable symptomatically , and not assoc with behavioral changes such as hallucinations, paranoia, or agitation. Past Medical History  Diagnosis Date  . HYPERLIPIDEMIA 04/16/2007  . ANEMIA-NOS 04/16/2007  . ANXIETY 04/16/2007  . DEPRESSION 09/03/2007  . HYPERTENSION 04/16/2007  . ATHEROSLERO NATV ART EXTREM W/INTERMIT CLAUDICAT 06/05/2010  . ALLERGIC RHINITIS 02/27/2009  . PNEUMONIA, COMMUNITY ACQUIRED, PNEUMOCOCCAL 12/07/2008  . ASTHMA 09/03/2007  . GERD 04/16/2007  . DIVERTICULOSIS, COLON 09/03/2007  . MENOPAUSAL DISORDER 04/18/2008  . OSTEOARTHRITIS, KNEE, LEFT 11/24/2009  . JOINT EFFUSION, LEFT KNEE 02/27/2009  . KNEE PAIN, BILATERAL 06/01/2010  . LOW BACK PAIN 04/16/2007  . LEG PAIN, LEFT 06/01/2010  . WEIGHT LOSS  07/25/2008  . RASH-NONVESICULAR 07/25/2008  . Abdominal pain, generalized 09/03/2007  . BREAST CANCER, HX OF 04/16/2007  . COLONIC POLYPS, HX OF 09/03/2007  . Dementia 06/17/2012    mild  . Peripheral neuropathy 06/17/2012   Past Surgical History  Procedure Laterality Date  . Abdominal hysterectomy  1966  . Tubal ligation    . S/p left mastectomy  2011    reports that she has never smoked. She has never used smokeless tobacco. She reports that she does not drink alcohol or use illicit drugs. family history includes Cancer in her sister; Diabetes in her other; and Hypertension in her other. Allergies  Allergen Reactions  . Amlodipine Besylate     REACTION: rash and itch  . Benzodiazepines Other (See Comments)    Hx of overuse/memory issue  . Penicillins   . Sulfonamide Derivatives    Current Outpatient Prescriptions on File Prior to Visit  Medication Sig Dispense Refill  . acetaminophen-codeine (TYLENOL #3) 300-30 MG per tablet Take 1 tablet by mouth every 6 (six) hours as needed.  120 tablet  5  . albuterol (VENTOLIN HFA) 108 (90 BASE) MCG/ACT inhaler Inhale 2 puffs into the lungs 4 (four) times daily.        Marland Kitchen aspirin 81 MG EC tablet Take 81 mg by mouth daily.        Marland Kitchen gabapentin (NEURONTIN) 100 MG capsule Take 1 capsule (100 mg total) by mouth 3 (three) times daily.  90 capsule  5  . omeprazole (PRILOSEC) 20 MG capsule  Take 20 mg by mouth 2 (two) times daily.       . Polyethylene Glycol 3350 POWD Use as directed       . sertraline (ZOLOFT) 50 MG tablet Take 2 tablets (100 mg total) by mouth daily.  180 tablet  6  . triamcinolone cream (KENALOG) 0.5 % APPLY TO AFFECTED AREA TWICE A DAY  30 g  2  . amLODipine (NORVASC) 10 MG tablet Take 1 tablet (10 mg total) by mouth daily.  30 tablet  11   No current facility-administered medications on file prior to visit.   Review of Systems  Constitutional: Negative for unexpected weight change, or unusual diaphoresis  HENT: Negative for  tinnitus.   Eyes: Negative for photophobia and visual disturbance.  Respiratory: Negative for choking and stridor.   Gastrointestinal: Negative for vomiting and blood in stool.  Genitourinary: Negative for hematuria and decreased urine volume.  Musculoskeletal: Negative for acute joint swelling Skin: Negative for color change and wound.  Neurological: Negative for tremors and numbness other than noted  Psychiatric/Behavioral: Negative for decreased concentration or  hyperactivity.       Objective:   Physical Exam BP 152/62  Pulse 75  Temp(Src) 98.1 F (36.7 C) (Oral)  Wt 100 lb (45.36 kg)  BMI 19.53 kg/m2  SpO2 92% VS noted, not ill appearing, NAD, converses easily Examined in wheelchair, declines up to exam table Constitutional: Pt appears well-developed and well-nourished.  HENT: Head: NCAT.  Right Ear: External ear normal.  Left Ear: External ear normal.  Eyes: Conjunctivae and EOM are normal. Pupils are equal, round, and reactive to light.  Neck: Normal range of motion. Neck supple.  Cardiovascular: Normal rate and regular rhythm.   Pulmonary/Chest: Effort normal and breath sounds normal.  Neurological: Pt is alert. Has baseline confusion per daughter Skin: Skin is warm. No erythema.  Psychiatric: Pt behavior is normal. Not depressed affect or signficant nervous Left knee with crepitus and 1+ effusion      Assessment & Plan:

## 2012-11-30 NOTE — Assessment & Plan Note (Addendum)
With incresaed pain and swelling, doubt would be surgical candidate, but will refer ortho  - prob needs cortisone or hyaluronic acid  Note:  Total time for pt hx, exam, review of record with pt in the room, determination of diagnoses and plan for further eval and tx is > 40 min, with over 50% spent in coordination and counseling of patient

## 2012-11-30 NOTE — Patient Instructions (Addendum)
You can stop the pravastatin (cholesterol pill) OK to NOT re-start the benazepril Please start Losartan100 mg per day instead Please take all new medication as prescribed- the generic for ambien 5 mg for sleep Please continue all other medications as before, and refills have been done if requested. Please have the pharmacy call with any other refills you may need. Please continue your efforts at being more active, low cholesterol diet, and weight control as you do You are otherwise up to date with prevention measures today. Please go to the XRAY Department in the Basement (go straight as you get off the elevator) for the x-ray testing Please go to the LAB in the Basement (turn left off the elevator) for the tests to be done today You will be contacted by phone if any changes need to be made immediately.  Otherwise, you will receive a letter about your results with an explanation You will be contacted regarding the referral for: orthopedic for the left knee Please remember to sign up for My Chart if you have not done so, as this will be important to you in the future with finding out test results, communicating by private email, and scheduling acute appointments online when needed.

## 2012-11-30 NOTE — Assessment & Plan Note (Signed)
With pain, urged to cont gabapentin at TID, I suggested change to once daily but too expensive she thinks

## 2012-11-30 NOTE — Assessment & Plan Note (Signed)
Avoid benadryl, trazodone and benzos given her dementia, for ambien 5 mg qhs prn

## 2012-12-01 ENCOUNTER — Encounter: Payer: Self-pay | Admitting: Internal Medicine

## 2012-12-01 LAB — TSH: TSH: 1.04 u[IU]/mL (ref 0.35–5.50)

## 2012-12-02 ENCOUNTER — Telehealth: Payer: Self-pay | Admitting: Internal Medicine

## 2012-12-02 NOTE — Telephone Encounter (Signed)
Patient Information:  Caller Name: Elizabeth Peterson  Phone: [phone]  Patient: Elizabeth Peterson, Elizabeth Peterson  Gender: Female  DOB: 1919/06/05  Age: 78 Years  PCP: Oliver Barre (Adults only)  Office Follow Up:  Does the office need to follow up with this patient?: No  Instructions For The Office: N/A  RN Note:  Daugher/Elizabeth Peterson is calling. States patient was seen in office 11/30/12. States patient received a call from office on 12/01/12 with information regarding and Orthopedic referral. Daughter states she is calling to obtian the address of the Orthopedic office and the date of the appt. so that she can make arrangements for patient. RN reviewed Counselling psychologist Health Record. Documentation noted from 12/01/12: Summary : pt scheduled for 12-03-2012@2 :30  Dr. Farris Has pt informed of appt   [facility] [address] Phone: [phone] Daughter/Elizabeth Peterson informed of above. Verbalizes understanding.  Symptoms  Reason For Call & Symptoms: Daughter/Elizabeth Peterson is calling about Orthopedic referral  Reviewed Health History In EMR: Yes  Reviewed Medications In EMR: Yes  Reviewed Allergies In EMR: Yes  Reviewed Surgeries / Procedures: Yes  Date of Onset of Symptoms: 12/02/2012  Guideline(s) Used:  No Protocol Available - Information Only  Disposition Per Guideline:   Home Care  Reason For Disposition Reached:   Information only question and nurse able to answer  Advice Given:  N/A

## 2012-12-09 ENCOUNTER — Telehealth: Payer: Self-pay | Admitting: Internal Medicine

## 2012-12-09 NOTE — Telephone Encounter (Signed)
Patient Information:  Caller Name: Elizabeth Peterson  Phone: [phone]  Patient: Elizabeth Peterson, Elizabeth Peterson  Gender: Female  DOB: November 25, 1918  Age: 79 Years  PCP: Oliver Barre (Adults only)  Office Follow Up:  Does the office need to follow up with this patient?: Yes  Instructions For The Office: please let Dr. Jonny Ruiz know of patient behavioral changes after starting the Ambien on 3/10.   Symptoms  Reason For Call & Symptoms: Daughter, Elizabeth Peterson calling to see what medications Dr. Jonny Ruiz put her on at her last visit. "She is flipping out this morning."  Her aunt is with her in [place], [place] is in [place].  Per her records she was given Ambien on 3/10.  She is going to have her aunt call the office so that we can triage the patient fully.  Reviewed Health History In EMR: N/A  Reviewed Medications In EMR: N/A  Reviewed Allergies In EMR: N/A  Reviewed Surgeries / Procedures: N/A  Date of Onset of Symptoms: 12/09/2012  Guideline(s) Used:  No Protocol Available - Information Only  Disposition Per Guideline:   Discuss with PCP and Callback by Nurse Today  Reason For Disposition Reached:   Nursing judgment  Advice Given:  N/A  Patient Will Follow Care Advice:  YES

## 2012-12-09 NOTE — Telephone Encounter (Signed)
Gabapentin is a medication originally for siezure (just not used for that now), and sometimes used on occasion now as "mood stabilizer," and for nerve pain;  The risk of siezure is essentially none, and I suspect thoughts of suicide would be extremely small chance (I have never heard this in over 15 yrs of using this medication for other patients);  I would encourage taking the medication as prescribed

## 2012-12-09 NOTE — Telephone Encounter (Signed)
Called the patients daughter Terrel Manalo [phone]).  The daughter stated the patient was taken off Xanax by Dr. Modesto Charon due to similar symptoms as she is now having.  She stated she does not do well with these types of medications. She stated she has had some confusion and difficult to deal with. They are requesting an alternative. Please advise

## 2012-12-09 NOTE — Telephone Encounter (Signed)
Pt's daughter called back with concerned regarding Gabapentin Rx. Daughter states that after researching medication she discovered that there is an increased risk of suicidal behaviors and seizure. Pt is requesting MD advise on whether this is the best medication for this patient, please advise.

## 2012-12-09 NOTE — Telephone Encounter (Signed)
Xanax does cause the problems as the daughter recalls, but gabapentin is not even close to being a similar medication  The only significant issue would be sleepiness with taking too much too fast, such as 300 mg tid; but she was given 100 mg tid which normally is very easy to tolerate, and there is no alternative similar medication that would have less side effect (alternatives would be cymbalta or lyrica, which would likely have more chance of side effect)  If they like, even with the lower dose, she can start the gabapentin 100 qhs for 3 days, than bid for 3 days, then tid after that. It normally is very easy to take

## 2012-12-10 NOTE — Telephone Encounter (Signed)
Called the daughter informed of MD instructions on medication.  The daughter informed that the real problem is that the patient controls her own medication.  She keeps all her medication in a box and does not allow the family to touch them.  The daughter states they have no control over what she takes or how much and if the patient is feeling sad or depressed will take more than prescribed.  So in other words no matter what is prescribed the patient takes the medications as she feels the need.   Please advise

## 2012-12-10 NOTE — Telephone Encounter (Signed)
Called the patients daughter left message to call back. 

## 2012-12-10 NOTE — Telephone Encounter (Signed)
Informed the daughter of MD instructions.  The daughter stated they have done all they can do with her and is almost impossible to control her medications. She stated the patient talks about her deceased husband (died 11 yrs. Ago) and that she is going to see him as if still alive.  They are requesting a low dose valium that she was on years ago.

## 2012-12-10 NOTE — Telephone Encounter (Signed)
This is a very difficult situation.  I think family has to be firm with pt, and confront her with the fact that she could harm herself by taking medication more (and less) than prescribed.  If the pt is a danger to herself in this way, she could be evaluated by a psychiatrist for competancy if that will help the family.

## 2012-12-10 NOTE — Telephone Encounter (Signed)
Sorry, not a good idea, as the valium is like the xanax, and could certainly make confusion worse.  Sorry, I dont have any other suggestions to offer.

## 2012-12-11 ENCOUNTER — Telehealth: Payer: Self-pay | Admitting: Internal Medicine

## 2012-12-11 NOTE — Telephone Encounter (Signed)
Requesting call back from Zella Ball, would like to speak to you again about her meds

## 2012-12-11 NOTE — Telephone Encounter (Signed)
Family informed of MD instructions. 

## 2012-12-11 NOTE — Telephone Encounter (Signed)
The daughter called back today to inform they did call Dr. Nash Dimmer office and was informed he prescribed the Gabapentin for Neuropathy.  They will encourage the patient to continue taking this medication.Marland Kitchen He also stopped the xanax and started her on zoloft which currently she is not taking. Informed there should be refills on that medication. The daughter apologized for all the confusion on the patients medication, please advise .

## 2012-12-11 NOTE — Telephone Encounter (Signed)
Ok, thanks.

## 2012-12-16 ENCOUNTER — Ambulatory Visit: Payer: Medicare Other | Admitting: Internal Medicine

## 2013-02-28 ENCOUNTER — Inpatient Hospital Stay (HOSPITAL_COMMUNITY)
Admission: EM | Admit: 2013-02-28 | Discharge: 2013-03-04 | DRG: 643 | Disposition: A | Discharge status: Home Health | Payer: Medicare Other | Attending: Internal Medicine | Admitting: Internal Medicine

## 2013-02-28 ENCOUNTER — Emergency Department (HOSPITAL_COMMUNITY): Payer: Medicare Other

## 2013-02-28 ENCOUNTER — Encounter (HOSPITAL_COMMUNITY): Payer: Self-pay | Admitting: Emergency Medicine

## 2013-02-28 ENCOUNTER — Inpatient Hospital Stay (HOSPITAL_COMMUNITY): Payer: Medicare Other

## 2013-02-28 DIAGNOSIS — D649 Anemia, unspecified: Secondary | ICD-10-CM | POA: Diagnosis present

## 2013-02-28 DIAGNOSIS — R05 Cough: Secondary | ICD-10-CM

## 2013-02-28 DIAGNOSIS — M25569 Pain in unspecified knee: Secondary | ICD-10-CM

## 2013-02-28 DIAGNOSIS — R5381 Other malaise: Secondary | ICD-10-CM | POA: Diagnosis present

## 2013-02-28 DIAGNOSIS — Z79899 Other long term (current) drug therapy: Secondary | ICD-10-CM

## 2013-02-28 DIAGNOSIS — Z681 Body mass index (BMI) 19 or less, adult: Secondary | ICD-10-CM

## 2013-02-28 DIAGNOSIS — F411 Generalized anxiety disorder: Secondary | ICD-10-CM | POA: Diagnosis present

## 2013-02-28 DIAGNOSIS — R634 Abnormal weight loss: Secondary | ICD-10-CM

## 2013-02-28 DIAGNOSIS — J45909 Unspecified asthma, uncomplicated: Secondary | ICD-10-CM

## 2013-02-28 DIAGNOSIS — M545 Low back pain: Secondary | ICD-10-CM

## 2013-02-28 DIAGNOSIS — M549 Dorsalgia, unspecified: Secondary | ICD-10-CM | POA: Diagnosis present

## 2013-02-28 DIAGNOSIS — IMO0002 Reserved for concepts with insufficient information to code with codable children: Secondary | ICD-10-CM

## 2013-02-28 DIAGNOSIS — E785 Hyperlipidemia, unspecified: Secondary | ICD-10-CM

## 2013-02-28 DIAGNOSIS — M25562 Pain in left knee: Secondary | ICD-10-CM

## 2013-02-28 DIAGNOSIS — R627 Adult failure to thrive: Secondary | ICD-10-CM | POA: Diagnosis present

## 2013-02-28 DIAGNOSIS — J189 Pneumonia, unspecified organism: Secondary | ICD-10-CM | POA: Diagnosis present

## 2013-02-28 DIAGNOSIS — R5383 Other fatigue: Secondary | ICD-10-CM | POA: Diagnosis present

## 2013-02-28 DIAGNOSIS — E43 Unspecified severe protein-calorie malnutrition: Secondary | ICD-10-CM | POA: Diagnosis present

## 2013-02-28 DIAGNOSIS — R0602 Shortness of breath: Secondary | ICD-10-CM | POA: Diagnosis present

## 2013-02-28 DIAGNOSIS — M1710 Unilateral primary osteoarthritis, unspecified knee: Secondary | ICD-10-CM

## 2013-02-28 DIAGNOSIS — G629 Polyneuropathy, unspecified: Secondary | ICD-10-CM | POA: Diagnosis present

## 2013-02-28 DIAGNOSIS — N959 Unspecified menopausal and perimenopausal disorder: Secondary | ICD-10-CM

## 2013-02-28 DIAGNOSIS — E871 Hypo-osmolality and hyponatremia: Secondary | ICD-10-CM | POA: Diagnosis present

## 2013-02-28 DIAGNOSIS — Z86010 Personal history of colonic polyps: Secondary | ICD-10-CM

## 2013-02-28 DIAGNOSIS — I70219 Atherosclerosis of native arteries of extremities with intermittent claudication, unspecified extremity: Secondary | ICD-10-CM

## 2013-02-28 DIAGNOSIS — F419 Anxiety disorder, unspecified: Secondary | ICD-10-CM | POA: Diagnosis present

## 2013-02-28 DIAGNOSIS — R630 Anorexia: Secondary | ICD-10-CM | POA: Diagnosis present

## 2013-02-28 DIAGNOSIS — M1990 Unspecified osteoarthritis, unspecified site: Secondary | ICD-10-CM

## 2013-02-28 DIAGNOSIS — G609 Hereditary and idiopathic neuropathy, unspecified: Secondary | ICD-10-CM | POA: Diagnosis present

## 2013-02-28 DIAGNOSIS — K5730 Diverticulosis of large intestine without perforation or abscess without bleeding: Secondary | ICD-10-CM

## 2013-02-28 DIAGNOSIS — I10 Essential (primary) hypertension: Secondary | ICD-10-CM | POA: Diagnosis present

## 2013-02-28 DIAGNOSIS — Z0000 Encounter for general adult medical examination without abnormal findings: Secondary | ICD-10-CM

## 2013-02-28 DIAGNOSIS — D638 Anemia in other chronic diseases classified elsewhere: Secondary | ICD-10-CM | POA: Diagnosis present

## 2013-02-28 DIAGNOSIS — R636 Underweight: Secondary | ICD-10-CM | POA: Diagnosis present

## 2013-02-28 DIAGNOSIS — E876 Hypokalemia: Secondary | ICD-10-CM | POA: Diagnosis present

## 2013-02-28 DIAGNOSIS — F0390 Unspecified dementia without behavioral disturbance: Secondary | ICD-10-CM | POA: Diagnosis present

## 2013-02-28 DIAGNOSIS — F329 Major depressive disorder, single episode, unspecified: Secondary | ICD-10-CM | POA: Diagnosis present

## 2013-02-28 DIAGNOSIS — G4700 Insomnia, unspecified: Secondary | ICD-10-CM

## 2013-02-28 DIAGNOSIS — E236 Other disorders of pituitary gland: Principal | ICD-10-CM | POA: Diagnosis present

## 2013-02-28 DIAGNOSIS — K219 Gastro-esophageal reflux disease without esophagitis: Secondary | ICD-10-CM | POA: Diagnosis present

## 2013-02-28 DIAGNOSIS — Z853 Personal history of malignant neoplasm of breast: Secondary | ICD-10-CM

## 2013-02-28 DIAGNOSIS — J309 Allergic rhinitis, unspecified: Secondary | ICD-10-CM

## 2013-02-28 DIAGNOSIS — F3289 Other specified depressive episodes: Secondary | ICD-10-CM | POA: Diagnosis present

## 2013-02-28 LAB — BASIC METABOLIC PANEL
BUN: 14 mg/dL (ref 6–23)
Chloride: 82 mEq/L — ABNORMAL LOW (ref 96–112)
Glucose, Bld: 105 mg/dL — ABNORMAL HIGH (ref 70–99)
Potassium: 3.1 mEq/L — ABNORMAL LOW (ref 3.5–5.1)

## 2013-02-28 LAB — CBC WITH DIFFERENTIAL/PLATELET
Basophils Relative: 1 % (ref 0–1)
Eosinophils Absolute: 0.5 10*3/uL (ref 0.0–0.7)
HCT: 33.9 % — ABNORMAL LOW (ref 36.0–46.0)
Hemoglobin: 12.3 g/dL (ref 12.0–15.0)
Lymphocytes Relative: 16 % (ref 12–46)
Lymphs Abs: 1.2 10*3/uL (ref 0.7–4.0)
MCHC: 36.3 g/dL — ABNORMAL HIGH (ref 30.0–36.0)
MCV: 81.5 fL (ref 78.0–100.0)
Monocytes Relative: 13 % — ABNORMAL HIGH (ref 3–12)
Neutro Abs: 5 10*3/uL (ref 1.7–7.7)

## 2013-02-28 LAB — URINALYSIS, ROUTINE W REFLEX MICROSCOPIC
Glucose, UA: NEGATIVE mg/dL
Hgb urine dipstick: NEGATIVE
Ketones, ur: NEGATIVE mg/dL
pH: 6.5 (ref 5.0–8.0)

## 2013-02-28 LAB — HEPATIC FUNCTION PANEL
Indirect Bilirubin: 0.2 mg/dL — ABNORMAL LOW (ref 0.3–0.9)
Total Protein: 7.9 g/dL (ref 6.0–8.3)

## 2013-02-28 MED ORDER — ACETAMINOPHEN-CODEINE #3 300-30 MG PO TABS
1.0000 | ORAL_TABLET | Freq: Four times a day (QID) | ORAL | Status: DC | PRN
  Administered 2013-03-01 – 2013-03-03 (×3): 1 via ORAL
  Filled 2013-02-28 (×3): qty 1

## 2013-02-28 MED ORDER — ALBUTEROL SULFATE HFA 108 (90 BASE) MCG/ACT IN AERS
2.0000 | INHALATION_SPRAY | RESPIRATORY_TRACT | Status: DC | PRN
  Filled 2013-02-28: qty 6.7

## 2013-02-28 MED ORDER — ONDANSETRON HCL 4 MG PO TABS: 4.0000 mg | ORAL_TABLET | Freq: Four times a day (QID) | ORAL | Status: DC | PRN

## 2013-02-28 MED ORDER — GABAPENTIN 100 MG PO CAPS
100.0000 mg | ORAL_CAPSULE | Freq: Three times a day (TID) | ORAL | Status: DC
  Administered 2013-02-28 – 2013-03-04 (×10): 100 mg via ORAL
  Filled 2013-02-28 (×13): qty 1

## 2013-02-28 MED ORDER — ONDANSETRON HCL 4 MG/2ML IJ SOLN: 4.0000 mg | Freq: Four times a day (QID) | INTRAMUSCULAR | Status: DC | PRN

## 2013-02-28 MED ORDER — SODIUM CHLORIDE 0.9 % IV BOLUS (SEPSIS)
1000.0000 mL | Freq: Once | INTRAVENOUS | Status: AC
  Administered 2013-02-28: 1000 mL via INTRAVENOUS

## 2013-02-28 MED ORDER — POLYETHYLENE GLYCOL 3350 17 G PO PACK
17.0000 g | PACK | Freq: Every day | ORAL | Status: DC | PRN
  Filled 2013-02-28: qty 1

## 2013-02-28 MED ORDER — SENNOSIDES-DOCUSATE SODIUM 8.6-50 MG PO TABS
1.0000 | ORAL_TABLET | Freq: Every evening | ORAL | Status: DC | PRN
  Administered 2013-03-01: 1 via ORAL
  Filled 2013-02-28: qty 1

## 2013-02-28 MED ORDER — AMLODIPINE BESYLATE 10 MG PO TABS
10.0000 mg | ORAL_TABLET | Freq: Every day | ORAL | Status: DC
  Administered 2013-02-28 – 2013-03-04 (×5): 10 mg via ORAL
  Filled 2013-02-28 (×5): qty 1

## 2013-02-28 MED ORDER — ALUM & MAG HYDROXIDE-SIMETH 200-200-20 MG/5ML PO SUSP: 30.0000 mL | Freq: Four times a day (QID) | ORAL | Status: DC | PRN

## 2013-02-28 MED ORDER — ACETAMINOPHEN 325 MG PO TABS
650.0000 mg | ORAL_TABLET | Freq: Four times a day (QID) | ORAL | Status: DC | PRN
  Administered 2013-02-28 – 2013-03-02 (×2): 650 mg via ORAL
  Filled 2013-02-28 (×2): qty 2

## 2013-02-28 MED ORDER — POTASSIUM CHLORIDE IN NACL 40-0.9 MEQ/L-% IV SOLN
INTRAVENOUS | Status: DC
  Administered 2013-02-28 – 2013-03-02 (×4): via INTRAVENOUS
  Filled 2013-02-28 (×7): qty 1000

## 2013-02-28 MED ORDER — ASPIRIN EC 81 MG PO TBEC
81.0000 mg | DELAYED_RELEASE_TABLET | Freq: Every day | ORAL | Status: DC
  Administered 2013-02-28 – 2013-03-04 (×5): 81 mg via ORAL
  Filled 2013-02-28 (×5): qty 1

## 2013-02-28 MED ORDER — ACETAMINOPHEN 650 MG RE SUPP: 650.0000 mg | Freq: Four times a day (QID) | RECTAL | Status: DC | PRN

## 2013-02-28 MED ORDER — LOSARTAN POTASSIUM 50 MG PO TABS
100.0000 mg | ORAL_TABLET | Freq: Every day | ORAL | Status: DC
  Administered 2013-02-28 – 2013-03-04 (×5): 100 mg via ORAL
  Filled 2013-02-28 (×5): qty 2

## 2013-02-28 MED ORDER — HEPARIN SODIUM (PORCINE) 5000 UNIT/ML IJ SOLN
5000.0000 [IU] | Freq: Three times a day (TID) | INTRAMUSCULAR | Status: DC
  Administered 2013-02-28 – 2013-03-04 (×10): 5000 [IU] via SUBCUTANEOUS
  Filled 2013-02-28 (×14): qty 1

## 2013-02-28 MED ORDER — PANTOPRAZOLE SODIUM 40 MG PO TBEC
40.0000 mg | DELAYED_RELEASE_TABLET | Freq: Every day | ORAL | Status: DC
  Administered 2013-03-01 – 2013-03-04 (×5): 40 mg via ORAL
  Filled 2013-02-28 (×6): qty 1

## 2013-02-28 MED ORDER — SODIUM CHLORIDE 0.9 % IV SOLN: INTRAVENOUS | Status: AC

## 2013-02-28 MED ORDER — NABUMETONE 500 MG PO TABS
500.0000 mg | ORAL_TABLET | Freq: Two times a day (BID) | ORAL | Status: DC
  Administered 2013-02-28 – 2013-03-04 (×8): 500 mg via ORAL
  Filled 2013-02-28 (×9): qty 1

## 2013-02-28 NOTE — H&P (Addendum)
[facility] History and Physical  Chandlar Staebell ZOX:[id number] DOB: [birth year] DOA: 02/28/2013  Referring physician: Dr. Gerhard Munch PCP: Oliver Barre, MD   Chief Complaint: Weakness, anorexia, fatigue.   History of Present Illness: Elizabeth Peterson is an 79 y.o. female with a PMH of breast cancer status post mastectomy, anemia, hypertension, anxiety and depression who presents to the hospital accompanied by her family with a 2 week history of anorexia, back and leg pains, frequent falls at home.  She was put on Zoloft back in March, and the patient's family noticed she would get disoriented after taking it.  She has not been sleeping well at night.  Family has noticed a dark color to her urine and some increase in confusion and disorientation. There are no aggravating or alleviating factors. Upon initial evaluation emergency department, the patient was noted to have a sodium of 119 and a potassium of 3.1.   Review of Systems: Constitutional: No fever, no chills;  Appetite diminished; + weight loss, no weight gain.  HEENT: No blurry vision, no diplopia, no pharyngitis, no dysphagia CV: No chest pain, no palpitations.  Resp: + occasional SOB, + cough. GI: No nausea, no vomiting, no diarrhea, no melena, no hematochezia.  GU: No dysuria, no hematuria, + urinary incontinence  MSK: + myalgias, + arthralgias.  Neuro:  No headache, no focal neurological deficits, no history of seizures.  Psych: + depression, + anxiety.  Endo: No thyroid disease, no DM, no heat intolerance, no cold intolerance, no polyuria, no polydipsia  Skin: No rashes, no skin lesions.  Heme: No easy bruising, no history of blood diseases.  Past Medical History Past Medical History  Diagnosis Date  . HYPERLIPIDEMIA 04/16/2007  . ANEMIA-NOS 04/16/2007  . ANXIETY 04/16/2007  . DEPRESSION 09/03/2007  . HYPERTENSION 04/16/2007  . ATHEROSLERO NATV ART EXTREM W/INTERMIT CLAUDICAT 06/05/2010  . ALLERGIC RHINITIS 02/27/2009  .  PNEUMONIA, COMMUNITY ACQUIRED, PNEUMOCOCCAL 12/07/2008  . ASTHMA 09/03/2007  . GERD 04/16/2007  . DIVERTICULOSIS, COLON 09/03/2007  . MENOPAUSAL DISORDER 04/18/2008  . OSTEOARTHRITIS, KNEE, LEFT 11/24/2009  . JOINT EFFUSION, LEFT KNEE 02/27/2009  . KNEE PAIN, BILATERAL 06/01/2010  . LOW BACK PAIN 04/16/2007  . LEG PAIN, LEFT 06/01/2010  . WEIGHT LOSS 07/25/2008  . RASH-NONVESICULAR 07/25/2008  . Abdominal pain, generalized 09/03/2007  . BREAST CANCER, HX OF 04/16/2007  . COLONIC POLYPS, HX OF 09/03/2007  . Dementia 06/17/2012    mild  . Peripheral neuropathy 06/17/2012     Past Surgical History Past Surgical History  Procedure Laterality Date  . Abdominal hysterectomy  1966  . Tubal ligation    . S/p left mastectomy  2011     Social History: History   Social History  . Marital Status: Single    Spouse Name: N/A    Number of Children: 6  . Years of Education: N/A   Occupational History  . Retired Estate manager/land agent work    Social History Main Topics  . Smoking status: Never Smoker   . Smokeless tobacco: Never Used  . Alcohol Use: No  . Drug Use: No  . Sexually Active: Not on file   Other Topics Concern  . Not on file   Social History Narrative   Lives with her sister.  Ambulates with a cane, but has not been able to ambulate over the past 2 weeks.    Family History:  Family History  Problem Relation Age of Onset  . Cancer Sister     Breast  . Hypertension Other   .  Diabetes Other     1st degree relative  . Hypertension Maternal Grandmother     Allergies: Amlodipine besylate; Benzodiazepines; Penicillins; and Sulfonamide derivatives  Meds: Prior to Admission medications   Medication Sig Start Date End Date Taking? Authorizing Provider  albuterol (VENTOLIN HFA) 108 (90 BASE) MCG/ACT inhaler Inhale 2 puffs into the lungs every 4 (four) hours as needed for wheezing or shortness of breath.    Yes Historical Provider, MD  amLODipine (NORVASC) 10 MG tablet Take 10 mg by mouth  daily.   Yes Historical Provider, MD  aspirin 81 MG EC tablet Take 81 mg by mouth daily.     Yes Historical Provider, MD  gabapentin (NEURONTIN) 100 MG capsule Take 1 capsule (100 mg total) by mouth 3 (three) times daily. 06/17/12  Yes Corwin Levins, MD  losartan (COZAAR) 100 MG tablet Take 1 tablet (100 mg total) by mouth daily. 11/30/12  Yes Corwin Levins, MD  nabumetone (RELAFEN) 500 MG tablet Take 500 mg by mouth 2 (two) times daily.   Yes Historical Provider, MD  omeprazole (PRILOSEC) 20 MG capsule Take 20 mg by mouth 2 (two) times daily.    Yes Historical Provider, MD  polyethylene glycol (MIRALAX / GLYCOLAX) packet Take 17 g by mouth daily as needed (for constipation).   Yes Historical Provider, MD  sertraline (ZOLOFT) 50 MG tablet Take 100 mg by mouth daily.   Yes Historical Provider, MD  acetaminophen-codeine (TYLENOL #3) 300-30 MG per tablet Take 1 tablet by mouth every 6 (six) hours as needed. 06/07/11   Corwin Levins, MD    Physical Exam: Filed Vitals:   02/28/13 1425 02/28/13 1627  BP: 167/85 191/58  Pulse: 86 64  Temp: 98.7 F (37.1 C)   TempSrc: Oral   Resp: 16 20  SpO2: 99% 96%     Physical Exam: Blood pressure 191/58, pulse 64, temperature 98.7 F (37.1 C), temperature source Oral, resp. rate 20, SpO2 96.00%. Gen: No acute distress. Head: Normocephalic, atraumatic. Eyes: PERRL, EOMI, sclerae nonicteric. Mouth: Oropharynx clear. Patient is edentulous. Neck: Supple, no thyromegaly, no lymphadenopathy, no jugular venous distention. Chest: Lungs are diminished in the bases. CV: Heart sounds are regular, no murmurs, rubs, or gallops. Abdomen: Soft, nontender, nondistended with normal active bowel sounds. Extremities: Extremities are with trace edema bilaterally. No calf tenderness. No cyanosis. Skin: Warm and dry. No rashes. Neuro: Alert and oriented times 1; cranial nerves II through XII grossly intact. Psych: Mood and affect normal.  Labs on Admission:  Basic  Metabolic Panel:  Recent Labs Lab 02/28/13 1535  NA 119*  K 3.1*  CL 82*  CO2 27  GLUCOSE 105*  BUN 14  CREATININE 0.60  CALCIUM 9.1   CBC:  Recent Labs Lab 02/28/13 1535  WBC 7.8  NEUTROABS 5.0  HGB 12.3  HCT 33.9*  MCV 81.5  PLT 326   Urinalysis    Component Value Date/Time   COLORURINE YELLOW 02/28/2013 1627   APPEARANCEUR CLOUDY* 02/28/2013 1627   LABSPEC 1.013 02/28/2013 1627   PHURINE 6.5 02/28/2013 1627   GLUCOSEU NEGATIVE 02/28/2013 1627   HGBUR NEGATIVE 02/28/2013 1627   BILIRUBINUR NEGATIVE 02/28/2013 1627   KETONESUR NEGATIVE 02/28/2013 1627   PROTEINUR 30* 02/28/2013 1627   UROBILINOGEN 1.0 02/28/2013 1627   NITRITE NEGATIVE 02/28/2013 1627   LEUKOCYTESUR MODERATE* 02/28/2013 1627     Radiological Exams on Admission: Dg Thoracic Spine 2 View  02/28/2013   *RADIOLOGY REPORT*  Clinical Data: Back pain, dysuria.  Mid and lower back pain. Confusion.  THORACIC SPINE - 2 VIEW  Comparison: Chest x-ray 11/28/2008  Findings: There is mild convex right scoliosis of the mid thoracic spine.  Moderate degenerative change is identified.  No evidence for acute fracture or subluxation.  No evidence for posterior rib fractures.  IMPRESSION:  1.  Degenerative changes. 2. No evidence for acute  abnormality.   Original Report Authenticated By: Norva Pavlov, M.D.    EKG: Independently reviewed. Normal sinus rhythm at 63 beats per minute. LVH. No ST-T wave abnormalities appreciated.  Assessment/Plan Principal Problem:   Hyponatremia with generalized weakness, anorexia -Stop Zoloft in case it is contributing to hyponatremia. -Check serum osmolality, urine osmolality, and spot urine sodium. Check TSH. Send urine for culture. -Hydrate with normal saline. -PT/OT/nutrition consults. Active Problems:   Severe protein calorie malnutrition / underweight -Dietitian consultation requested. -Check TSH.   Hypokalemia -Add potassium to IV fluids.   HYPERTENSION -Continue Norvasc and Cozaar.    GERD -Continue PPI therapy.   Dementia -PT/OT consultations requested.   Peripheral neuropathy -Continue Neurontin.   Back pain -Continue Relafen and Tylenol #3 as needed. -Plain films negative for fractures.   Anxiety and depression -Hold Zoloft for now.   Shortness of breath -Continue bronchodilators as needed. -Check chest x-ray to rule out occult pneumonia.   Code Status: Full, confirmed with family. Family Communication: Tiara Maultsby (granddaughter): [phone] Disposition Plan: Home versus SNF depending on progress.  Time spent: 1 hour.  RAMA,CHRISTINA [facility] Pager [phone]  If 7PM-7AM, please contact night-coverage [email or web address] Password [email or web address] 02/28/2013, 6:01 PM

## 2013-02-28 NOTE — ED Notes (Signed)
Attempted to call report to 3 E. RN unable to take report at this time. Will call back.  

## 2013-02-28 NOTE — ED Notes (Signed)
MD at bedside. Dr. Lockwood at bedside.  

## 2013-02-28 NOTE — ED Notes (Signed)
MD at bedside. Dr. Rama, hospitalist, at bedside.  

## 2013-02-28 NOTE — ED Notes (Signed)
Lab tech at bedside for further blood draw.

## 2013-02-28 NOTE — ED Notes (Signed)
Pt has been having back pain for a week.  Family reports that pt has had dark foul smelling urine.  Pt has not been eating or drinking well for a while.

## 2013-02-28 NOTE — ED Provider Notes (Signed)
History     CSN: [id number]  Arrival date & time 02/28/13  1409   First MD Initiated Contact with Patient 02/28/13 1504      Chief Complaint  Patient presents with  . Back Pain  . Dysuria      HPI  Patient presents with her sister who provides much of the history of present illness.  The patient is awake, but minimally verbal.  The patient and her sister noted the past week the patient has been less interactive, with increasing pain in her back, both upper and lower.  There is associated anorexia, but no vomiting, no hematuria, no dysuria.  Urine does seem foul smelling. Patient herself denies abdominal pain, chest pain, dyspnea. She is quiet, but does answer questions when spoken to directly.  She seems to fall sleep soon thereafter. The patient's sister says that this is entirely new behavior.   Past Medical History  Diagnosis Date  . HYPERLIPIDEMIA 04/16/2007  . ANEMIA-NOS 04/16/2007  . ANXIETY 04/16/2007  . DEPRESSION 09/03/2007  . HYPERTENSION 04/16/2007  . ATHEROSLERO NATV ART EXTREM W/INTERMIT CLAUDICAT 06/05/2010  . ALLERGIC RHINITIS 02/27/2009  . PNEUMONIA, COMMUNITY ACQUIRED, PNEUMOCOCCAL 12/07/2008  . ASTHMA 09/03/2007  . GERD 04/16/2007  . DIVERTICULOSIS, COLON 09/03/2007  . MENOPAUSAL DISORDER 04/18/2008  . OSTEOARTHRITIS, KNEE, LEFT 11/24/2009  . JOINT EFFUSION, LEFT KNEE 02/27/2009  . KNEE PAIN, BILATERAL 06/01/2010  . LOW BACK PAIN 04/16/2007  . LEG PAIN, LEFT 06/01/2010  . WEIGHT LOSS 07/25/2008  . RASH-NONVESICULAR 07/25/2008  . Abdominal pain, generalized 09/03/2007  . BREAST CANCER, HX OF 04/16/2007  . COLONIC POLYPS, HX OF 09/03/2007  . Dementia 06/17/2012    mild  . Peripheral neuropathy 06/17/2012    Past Surgical History  Procedure Laterality Date  . Abdominal hysterectomy  1966  . Tubal ligation    . S/p left mastectomy  2011    Family History  Problem Relation Age of Onset  . Cancer Sister     Breast  . Hypertension Other   . Diabetes Other     1st  degree relative    History  Substance Use Topics  . Smoking status: Never Smoker   . Smokeless tobacco: Never Used  . Alcohol Use: No    OB History   Grav Para Term Preterm Abortions TAB SAB Ect Mult Living                  Review of Systems  Constitutional:       Per HPI, otherwise negative  HENT:       Per HPI, otherwise negative  Respiratory:       Per HPI, otherwise negative  Cardiovascular:       Per HPI, otherwise negative  Gastrointestinal: Negative for vomiting.  Endocrine:       Negative aside from HPI  Genitourinary:       Neg aside from HPI   Musculoskeletal:       Per HPI, otherwise negative  Skin: Negative.   Neurological: Negative for syncope.    Allergies  Amlodipine besylate; Benzodiazepines; Penicillins; and Sulfonamide derivatives  Home Medications   Current Outpatient Rx  Name  Route  Sig  Dispense  Refill  . albuterol (VENTOLIN HFA) 108 (90 BASE) MCG/ACT inhaler   Inhalation   Inhale 2 puffs into the lungs every 4 (four) hours as needed for wheezing or shortness of breath.          Marland Kitchen amLODipine (NORVASC) 10 MG tablet  Oral   Take 10 mg by mouth daily.         Marland Kitchen aspirin 81 MG EC tablet   Oral   Take 81 mg by mouth daily.           Marland Kitchen gabapentin (NEURONTIN) 100 MG capsule   Oral   Take 1 capsule (100 mg total) by mouth 3 (three) times daily.   90 capsule   5   . losartan (COZAAR) 100 MG tablet   Oral   Take 1 tablet (100 mg total) by mouth daily.   90 tablet   3   . nabumetone (RELAFEN) 500 MG tablet   Oral   Take 500 mg by mouth 2 (two) times daily.         Marland Kitchen omeprazole (PRILOSEC) 20 MG capsule   Oral   Take 20 mg by mouth 2 (two) times daily.          . polyethylene glycol (MIRALAX / GLYCOLAX) packet   Oral   Take 17 g by mouth daily as needed (for constipation).         . sertraline (ZOLOFT) 50 MG tablet   Oral   Take 100 mg by mouth daily.         Marland Kitchen acetaminophen-codeine (TYLENOL #3) 300-30 MG per  tablet   Oral   Take 1 tablet by mouth every 6 (six) hours as needed.   120 tablet   5     BP 167/85  Pulse 86  Temp(Src) 98.7 F (37.1 C) (Oral)  Resp 16  SpO2 99%  Physical Exam  Nursing note and vitals reviewed. Constitutional: She is oriented to person, place, and time. She appears well-developed and well-nourished. She appears cachectic. She appears ill.  HENT:  Head: Normocephalic and atraumatic.  Eyes: Conjunctivae and EOM are normal.  Cardiovascular: Normal rate and regular rhythm.   Murmur heard. Pulmonary/Chest: Effort normal and breath sounds normal. No stridor. No respiratory distress.  Abdominal: She exhibits no distension.  Musculoskeletal: She exhibits no edema.  Diffuse atrophy, no gross lesions  Neurological: She is alert and oriented to person, place, and time. No cranial nerve deficit.  Patient moves all extremities spontaneously, though minimally. She does not cooperate entirely with exam, but there is no focal/gross neurologic deficits  Skin: Skin is warm and dry.  Psychiatric: Her speech is delayed. She is slowed and withdrawn.    ED Course  Procedures (including critical care time)  Labs Reviewed  URINALYSIS, ROUTINE W REFLEX MICROSCOPIC  CBC WITH DIFFERENTIAL  BASIC METABOLIC PANEL   No results found.   No diagnosis found.  Cardiac 85 sinus rhythm normal Pulse ox 99% room air normal    Date: 02/28/2013  Rate: 63  Rhythm: normal sinus rhythm  QRS Axis: left  Intervals: normal  ST/T Wave abnormalities: nonspecific T wave changes  Conduction Disutrbances:none  Narrative Interpretation:   Old EKG Reviewed: changes noted Slower, abnormal  Initial labs notable for hyponatremia - IVF running.  Update: Patient's VS remain similar,  No new complaints  5:09 PM Patient remains listless-appearing.  I discussed the results with the patient's family members, including her granddaughter.  We discussed admission, with the patient's critically  abnormal sodium level.  Update: I discussed patient's care with our admitting hospitalist. We agreed that within the urinalysis positive for leukocytes, negative for nitrates, absent fever or leukocytosis, culture will be sent, and empiric treatment for a UTI is not initially indicated.    MDM  This  elderly female presents with back pain, generalized complaints, including fatigue and anorexia.  On exam she is listless, but not overtly obtunded.  His mild tenderness to palpation, but no gross physical exam abnormalities down her listlessness, generally poor physical appearance.  The patient's labs are notable for critically abnormal sodium value requiring IV fluid resuscitation. Following initiation of resuscitation, multiple repeat evaluation, discussion with the patient's family and our admitting team, the patient was admitted for further evaluation and management.  CRITICAL CARE Performed by: Gerhard Munch Total critical care time: 35 Critical care time was exclusive of separately billable procedures and treating other patients. Critical care was necessary to treat or prevent imminent or life-threatening deterioration. Critical care was time spent personally by me on the following activities: development of treatment plan with patient and/or surrogate as well as nursing, discussions with consultants, evaluation of patient's response to treatment, examination of patient, obtaining history from patient or surrogate, ordering and performing treatments and interventions, ordering and review of laboratory studies, ordering and review of radiographic studies, pulse oximetry and re-evaluation of patient's condition.         Gerhard Munch, MD 02/28/13 1728

## 2013-02-28 NOTE — ED Notes (Signed)
Na 119

## 2013-03-01 DIAGNOSIS — D649 Anemia, unspecified: Secondary | ICD-10-CM | POA: Diagnosis present

## 2013-03-01 LAB — OSMOLALITY, URINE: Osmolality, Ur: 256 mOsm/kg — ABNORMAL LOW (ref 390–1090)

## 2013-03-01 LAB — CBC
HCT: 29.8 % — ABNORMAL LOW (ref 36.0–46.0)
Hemoglobin: 10.5 g/dL — ABNORMAL LOW (ref 12.0–15.0)
MCH: 29.2 pg (ref 26.0–34.0)
MCV: 82.8 fL (ref 78.0–100.0)
RBC: 3.6 MIL/uL — ABNORMAL LOW (ref 3.87–5.11)
WBC: 7.2 10*3/uL (ref 4.0–10.5)

## 2013-03-01 LAB — BASIC METABOLIC PANEL
CO2: 22 mEq/L (ref 19–32)
Calcium: 8.1 mg/dL — ABNORMAL LOW (ref 8.4–10.5)
Chloride: 92 mEq/L — ABNORMAL LOW (ref 96–112)
Glucose, Bld: 102 mg/dL — ABNORMAL HIGH (ref 70–99)
Sodium: 124 mEq/L — ABNORMAL LOW (ref 135–145)

## 2013-03-01 LAB — TSH: TSH: 0.52 u[IU]/mL (ref 0.350–4.500)

## 2013-03-01 LAB — SODIUM, URINE, RANDOM: Sodium, Ur: 25 mEq/L

## 2013-03-01 LAB — OSMOLALITY: Osmolality: 248 mOsm/kg — CL (ref 275–300)

## 2013-03-01 MED ORDER — ENSURE COMPLETE PO LIQD
237.0000 mL | Freq: Two times a day (BID) | ORAL | Status: DC
  Administered 2013-03-02 – 2013-03-04 (×4): 237 mL via ORAL

## 2013-03-01 MED ORDER — FUROSEMIDE 20 MG PO TABS
20.0000 mg | ORAL_TABLET | Freq: Every day | ORAL | Status: DC
  Administered 2013-03-01 – 2013-03-04 (×4): 20 mg via ORAL
  Filled 2013-03-01 (×4): qty 1

## 2013-03-01 MED ORDER — MORPHINE SULFATE 2 MG/ML IJ SOLN
2.0000 mg | INTRAMUSCULAR | Status: DC | PRN
  Administered 2013-03-01 – 2013-03-03 (×5): 2 mg via INTRAVENOUS
  Filled 2013-03-01 (×5): qty 1

## 2013-03-01 NOTE — Evaluation (Signed)
Occupational Therapy Evaluation Patient Details Name: Elizabeth Peterson MRN: [id number] DOB: 1918-12-12 Today's Date: 03/01/2013 Time: 1310-1330 OT Time Calculation (min): 20 min  OT Assessment / Plan / Recommendation Clinical Impression  79 y/o female admitted  02/28/13 with weakness, frequent falls, anorxia, back and leg pain. Pt found with hyponatremia, negative for fractures.Pt requires mod/max assistance to pivot to BSC/recliner. Pt will benefit from OT while in acute care to improve  I with ADL activity in order for pt to return to PLOF    OT Assessment  Patient needs continued OT Services    Follow Up Recommendations  Home health OT;Supervision/Assistance - 24 hour;SNF;Other (comment) (HH only if pt does have 24/7 A)       Equipment Recommendations  None recommended by OT       Frequency  Min 2X/week    Precautions / Restrictions Precautions Precautions: Fall Restrictions Weight Bearing Restrictions: No       ADL  Grooming: Simulated;Wash/dry hands;Wash/dry face Where Assessed - Grooming: Unsupported sitting Upper Body Dressing: Simulated;Minimal assistance Where Assessed - Upper Body Dressing: Unsupported sitting Lower Body Dressing: Simulated;Maximal assistance Where Assessed - Lower Body Dressing: Supported sit to stand Toilet Transfer: Simulated;Maximal assistance Toilet Transfer Method: Sit to stand Transfers/Ambulation Related to ADLs: No family present for OT eval . Pt will need 24/7 A upon DC    OT Diagnosis: Generalized weakness  OT Problem List: Decreased strength;Decreased safety awareness;Decreased activity tolerance OT Treatment Interventions: Self-care/ADL training;Patient/family education   OT Goals Acute Rehab OT Goals OT Goal Formulation: With patient Time For Goal Achievement: 03/15/13 Potential to Achieve Goals: Good ADL Goals Pt Will Perform Grooming: with supervision;Standing at sink ADL Goal: Grooming - Progress: Goal set today Pt Will  Perform Upper Body Dressing: with supervision;Sit to stand from chair ADL Goal: Upper Body Dressing - Progress: Goal set today Pt Will Perform Lower Body Dressing: with min assist;Sit to stand from chair ADL Goal: Lower Body Dressing - Progress: Goal set today Pt Will Transfer to Toilet: with min assist;3-in-1 ADL Goal: Toilet Transfer - Progress: Goal set today Pt Will Perform Toileting - Clothing Manipulation: with min assist;Standing ADL Goal: Toileting - Clothing Manipulation - Progress: Goal set today  Visit Information  Last OT Received On: 03/01/13 Assistance Needed: +1    Subjective Data  Subjective: Oh goodness- where is my cane?   Prior Functioning     Home Living Lives With: Family Available Help at Discharge: Family Additional Comments: no family present to get info re: prior function and who cares for pt. Prior Function Level of Independence: Needs assistance Needs Assistance: Transfers;Toileting;Dressing;Bathing Comments: no family present for info. Communication Communication: No difficulties Dominant Hand: Right         Vision/Perception Vision - History Patient Visual Report: No change from baseline   Cognition  Cognition Arousal/Alertness: Awake/alert Behavior During Therapy: WFL for tasks assessed/performed Overall Cognitive Status: History of cognitive impairments - at baseline    Extremity/Trunk Assessment Right Upper Extremity Assessment RUE ROM/Strength/Tone: [facility] for tasks assessed Left Upper Extremity Assessment LUE ROM/Strength/Tone: WFL for tasks assessed Right Lower Extremity Assessment RLE ROM/Strength/Tone: Deficits RLE ROM/Strength/Tone Deficits: bears weght but  had difficulty taking a step Left Lower Extremity Assessment LLE ROM/Strength/Tone: Deficits LLE ROM/Strength/Tone Deficits: appears more involved than R, difficulty taking a step.     Mobility Bed Mobility Bed Mobility: Supine to Sit;Sitting - Scoot to Edge of  Bed Supine to Sit: 3: Mod assist Sitting - Scoot to Edge of Bed: 3: Mod  assist Details for Bed Mobility Assistance: frequent tactile cues to get pt to initiate task.pt does not completely follow commands. Transfers Transfers: Sit to Stand;Stand to Sit Sit to Stand: 2: Max assist;With upper extremity assist Stand to Sit: 3: Mod assist;With upper extremity assist Details for Transfer Assistance: hand over hand cues to stand, reach for armrest of BSC and recliner. Pt does not follow. transferred to [facility] then to recliner.        Balance Balance Balance Assessed: Yes Static Sitting Balance Static Sitting - Balance Support: No upper extremity supported;Feet supported Static Sitting - Level of Assistance: 5: Stand by assistance Static Sitting - Comment/# of Minutes: pt tends to lean forward. Pt able to reach and touch floor as pt thought there was water on the floor.   End of Session OT - End of Session Activity Tolerance: Patient tolerated treatment well Patient left: in chair;with call bell/phone within reach;with nursing in room Nurse Communication: Mobility status  GO     Alba Cory 03/01/2013, 1:53 PM

## 2013-03-01 NOTE — Progress Notes (Signed)
INITIAL NUTRITION ASSESSMENT  DOCUMENTATION CODES Per approved criteria  -Not Applicable   INTERVENTION: Provide Ensure Complete BID Provide Magic Cup once daily Recommend liberalizing diet to regular  NUTRITION DIAGNOSIS: Inadequate oral intake related to poor appetite as evidenced by pt's report.   Goal: Pt to meet >/= 90% of their estimated nutrition needs  Monitor:  PO intake Weight Labs  Reason for Assessment: Consult to assess nutritional needs and abnormal weight loss  79 y.o. female  Admitting Dx: Hyponatremia  ASSESSMENT: 79 y.o. female with a PMH of breast cancer status post mastectomy, anemia, hypertension, anxiety and depression who presented to the hospital on 02/28/2013 with generalized weakness, anorexia, and failure to thrive. Upon initial evaluation, she was noted to have hyponatremia and hypokalemia.  Pt reports that she has had a poor appetite for the past 2 weeks and she had not been finishing her meals at home PTA. Pt states she usually eats 2 meals daily and she usually weighs between 130 and 140 lbs. Per weight history pt has weighed approximately 100 lbs for the past 2 years; no new weight in the past 3 months. Pt ate 85% of breakfast this morning.   Height: Ht Readings from Last 1 Encounters:  06/17/12 5' (1.524 m)    Weight: Wt Readings from Last 1 Encounters:  11/30/12 100 lb (45.36 kg)    Ideal Body Weight: 100 lbs  % Ideal Body Weight: 100%  Wt Readings from Last 10 Encounters:  11/30/12 100 lb (45.36 kg)  06/17/12 100 lb 4 oz (45.473 kg)  05/14/12 100 lb (45.36 kg)  02/12/12 99 lb (44.906 kg)  12/06/11 102 lb 8 oz (46.494 kg)  11/14/11 103 lb (46.72 kg)  10/01/11 111 lb (50.349 kg)  06/07/11 111 lb 8 oz (50.576 kg)  01/07/11 98 lb 12 oz (44.793 kg)  12/05/10 97 lb 12 oz (44.339 kg)    Usual Body Weight: 100 lbs  % Usual Body Weight: 100%  BMI:  19.5 kg/m^2 (based on weight from 11/30/12)  Estimated Nutritional Needs: Kcal:  1210-1400 Protein: 45-55 grams Fluid: 1.4 L  Skin: intact  Nutrition Focused Physical Exam:  Subcutaneous Fat:  Orbital Region: wnl Upper Arm Region: mild wasting Thoracic and Lumbar Region: NA  Muscle:  Temple Region: wnl Clavicle Bone Region: mild wasting Clavicle and Acromion Bone Region: mild wasting Scapular Bone Region: NA Dorsal Hand: wnl Patellar Region: wnl Anterior Thigh Region: mild wasting Posterior Calf Region: wnl  Edema: not present  Diet Order: Cardiac  EDUCATION NEEDS: -No education needs identified at this time   Intake/Output Summary (Last 24 hours) at 03/01/13 1230 Last data filed at 03/01/13 1100  Gross per 24 hour  Intake 1486.67 ml  Output   1275 ml  Net 211.67 ml    Last BM: 6/7  Labs:   Recent Labs Lab 02/28/13 1535 03/01/13 0415  NA 119* 124*  K 3.1* 3.8  CL 82* 92*  CO2 27 22  BUN 14 9  CREATININE 0.60 0.54  CALCIUM 9.1 8.1*  GLUCOSE 105* 102*    CBG (last 3)  No results found for this basename: GLUCAP,  in the last 72 hours  Scheduled Meds: . sodium chloride   Intravenous STAT  . amLODipine  10 mg Oral Daily  . aspirin EC  81 mg Oral Daily  . furosemide  20 mg Oral Daily  . gabapentin  100 mg Oral TID  . heparin  5,000 Units Subcutaneous Q8H  . losartan  100  mg Oral Daily  . nabumetone  500 mg Oral BID  . pantoprazole  40 mg Oral Daily    Continuous Infusions: . 0.9 % NaCl with KCl 40 mEq / L 100 mL/hr at 03/01/13 1610    Past Medical History  Diagnosis Date  . HYPERLIPIDEMIA 04/16/2007  . ANEMIA-NOS 04/16/2007  . ANXIETY 04/16/2007  . DEPRESSION 09/03/2007  . HYPERTENSION 04/16/2007  . ATHEROSLERO NATV ART EXTREM W/INTERMIT CLAUDICAT 06/05/2010  . ALLERGIC RHINITIS 02/27/2009  . PNEUMONIA, COMMUNITY ACQUIRED, PNEUMOCOCCAL 12/07/2008  . ASTHMA 09/03/2007  . GERD 04/16/2007  . DIVERTICULOSIS, COLON 09/03/2007  . MENOPAUSAL DISORDER 04/18/2008  . OSTEOARTHRITIS, KNEE, LEFT 11/24/2009  . JOINT EFFUSION, LEFT KNEE  02/27/2009  . KNEE PAIN, BILATERAL 06/01/2010  . LOW BACK PAIN 04/16/2007  . LEG PAIN, LEFT 06/01/2010  . WEIGHT LOSS 07/25/2008  . RASH-NONVESICULAR 07/25/2008  . Abdominal pain, generalized 09/03/2007  . BREAST CANCER, HX OF 04/16/2007  . COLONIC POLYPS, HX OF 09/03/2007  . Dementia 06/17/2012    mild  . Peripheral neuropathy 06/17/2012    Past Surgical History  Procedure Laterality Date  . Abdominal hysterectomy  1966  . Tubal ligation    . S/p left mastectomy  2011    Ian Malkin RD, LDN Inpatient Clinical Dietitian Pager: [phone] After Hours Pager: [phone]

## 2013-03-01 NOTE — Evaluation (Signed)
Physical Therapy Evaluation Patient Details Name: Elizabeth Peterson MRN: [id number] DOB: Aug 13, 1919 Today's Date: 03/01/2013 Time: 1209-1226 PT Time Calculation (min): 17 min  PT Assessment / Plan / Recommendation Clinical Impression  79 y/o female admitted  02/28/13 with weakness, frequent falls, anorxia, back and leg pain. Pt found with hyponatremia, negative for fractures.Pt requires mod/max assistance to pivot to BSC/recliner. Pt will benefit from PT while in acute care to improve functional mobility. No family present to discuss DC plans. Pt was living with family PTA.     PT Assessment  Patient needs continued PT services    Follow Up Recommendations  Home health PT    Does the patient have the potential to tolerate intense rehabilitation      Barriers to Discharge        Equipment Recommendations  None recommended by PT    Recommendations for Other Services     Frequency Min 3X/week    Precautions / Restrictions Restrictions Weight Bearing Restrictions: No   Pertinent Vitals/Pain       Mobility  Bed Mobility Bed Mobility: Supine to Sit;Sitting - Scoot to Edge of Bed Supine to Sit: 3: Mod assist Sitting - Scoot to Edge of Bed: 3: Mod assist Details for Bed Mobility Assistance: frequent tactile cues to get pt to initiate task.pt does not completely follow commands. Transfers Transfers: Sit to Stand;Stand to [employer] Transfers Sit to Stand: 2: Max assist Stand to Sit: 3: Mod assist Stand Pivot Transfers: 2: Max assist Details for Transfer Assistance: hand over hand cues to stand, reach for armrest of BSC and recliner. Pt does not follow. transferred to [facility] then to recliner. Ambulation/Gait Ambulation/Gait Assistance: Not tested (comment)    Exercises     PT Diagnosis: Generalized weakness;Difficulty walking  PT Problem List: Decreased strength;Decreased activity tolerance;Decreased balance;Decreased mobility;Decreased cognition PT Treatment Interventions:  DME instruction;Functional mobility training;Gait training;Therapeutic activities;Patient/family education   PT Goals Acute Rehab PT Goals PT Goal Formulation: Patient unable to participate in goal setting Time For Goal Achievement: 03/15/13 Potential to Achieve Goals: Fair Pt will go Supine/Side to Sit: with supervision PT Goal: Supine/Side to Sit - Progress: Goal set today Pt will go Sit to Supine/Side: with supervision PT Goal: Sit to Supine/Side - Progress: Goal set today Pt will go Sit to Stand: with supervision PT Goal: Sit to Stand - Progress: Goal set today Pt will go Stand to Sit: with supervision PT Goal: Stand to Sit - Progress: Goal set today Pt will Transfer Bed to Chair/Chair to Bed: with supervision PT Transfer Goal: Bed to Chair/Chair to Bed - Progress: Goal set today Pt will Ambulate: 1 - 15 feet;with min assist PT Goal: Ambulate - Progress: Goal set today  Visit Information  Last PT Received On: 03/01/13 Assistance Needed: +1    Subjective Data  Subjective: Hey, how are you? Patient Stated Goal: no family present, pt unable.   Prior Functioning  Home Living Lives With: Family Available Help at Discharge: Family Additional Comments: no family present to get info re: prior function and who cares for pt. Prior Function Level of Independence: Needs assistance Needs Assistance: Transfers;Toileting;Dressing;Bathing Comments: no family present for info.    Cognition  Cognition Arousal/Alertness: Awake/alert Behavior During Therapy: WFL for tasks assessed/performed Overall Cognitive Status: History of cognitive impairments - at baseline    Extremity/Trunk Assessment Right Upper Extremity Assessment RUE ROM/Strength/Tone: [facility] for tasks assessed Left Upper Extremity Assessment LUE ROM/Strength/Tone: WFL for tasks assessed Right Lower Extremity Assessment RLE ROM/Strength/Tone: Deficits  RLE ROM/Strength/Tone Deficits: bears weght but  had difficulty taking a  step Left Lower Extremity Assessment LLE ROM/Strength/Tone: Deficits LLE ROM/Strength/Tone Deficits: appears more involved than R, difficulty taking a step.   Balance Balance Balance Assessed: Yes Static Sitting Balance Static Sitting - Balance Support: No upper extremity supported;Feet supported Static Sitting - Level of Assistance: 5: Stand by assistance Static Sitting - Comment/# of Minutes: pt tends to lean forward. Pt able to reach and touch floor as pt thought there was water on the floor.  End of Session PT - End of Session Activity Tolerance: Patient tolerated treatment well Patient left: in chair;with call bell/phone within reach;with nursing in room Nurse Communication: Mobility status  GP     Rada Hay 03/01/2013, 12:56 PM  Blanchard Kelch PT [phone]

## 2013-03-01 NOTE — Progress Notes (Signed)
Pt sleeping most of the day. When awake ,pleasantly confused.

## 2013-03-01 NOTE — Progress Notes (Signed)
CSW received consult for SNF placement. CSW reviewed PT/OT evaluation & disposition recommendation (Home health PT/OT;Supervision/Assistance - 24 hour;SNF - HH only if pt does have 24/7 A). CSW spoke with patient's daughter, Nickolette Espinola (cell#: [phone], h#: [phone]) & confirmed that patient's 77 year old sister, Marily Memos (ph#: [phone]) lives with her and other family members such as her grandaughter, Rinaldo Cloud & other daughter, Candus Braud are able to assist patient. Daughter asked about Home Health services, CSW informed daughter that RNCM, Roxy Manns will arrange prior to discharge. [place] made aware. CSW signing off.   Unice Bailey, LCSW [facility] Clinical Social Worker cell #: [phone]

## 2013-03-01 NOTE — Progress Notes (Signed)
[facility] PROGRESS NOTE  Elizabeth Peterson UXL:[id number] DOB: 22-Nov-1918 DOA: 02/28/2013 PCP: Elizabeth Barre, MD  Brief narrative: Elizabeth Peterson is an 79 y.o. female with a PMH of breast cancer status post mastectomy, anemia, hypertension, anxiety and depression who presented to the hospital on 02/28/2013 with generalized weakness, anorexia, and failure to thrive. Upon initial evaluation, she was noted to have hyponatremia and hypokalemia.  Assessment/Plan: Principal Problem:  Hyponatremia with generalized weakness, anorexia  -Zoloft discontinued in case it is contributing to hyponatremia.  -Serum osmolality low at 248, urine osmolality low at 256, and spot urine sodium 25. Differential: Hypothyroidism, glucocorticoid deficiency, SIADH, stress, or drugs. TSH within normal limits. Check a.m. cortisol. -Will place on water restriction. Sodium improving with normal saline infusion. -PT/OT/nutrition consults pending.  Active Problems: Normocytic anemia -Likely dilutional. Underlying anemia of chronic disease likely. Severe protein calorie malnutrition / underweight  -Dietitian consultation requested.  -TSH within normal limits.  Hypokalemia  -Resolved with potassium added to IV fluids.  HYPERTENSION  -Continue Norvasc and Cozaar. Blood pressure suboptimally controlled. Will add Lasix. GERD  -Continue PPI therapy.  Dementia  -PT/OT consultations requested.  Peripheral neuropathy  -Continue Neurontin.  Back pain  -Continue Relafen and Tylenol #3 as needed.  -Plain films negative for fractures.  Anxiety and depression  -Hold Zoloft for now.  Shortness of breath  -Continue bronchodilators as needed.  -Check chest x-ray to rule out occult pneumonia.   Code Status: Full, confirmed with family.  Family Communication: Elizabeth Peterson (granddaughter): [phone].  Message left for daughter, Elizabeth Peterson [phone] Disposition Plan: Home versus SNF depending on progress.   Medical  Consultants:  None.  Other Consultants:  None.  Anti-infectives:  None.  HPI/Subjective: Elizabeth Peterson is more awake and alert. She is oriented to the fact that she is in hospitals this morning. Her affect is bright. She is without complaints and states she "feels better ". No pain, or dyspnea.  Objective: Filed Vitals:   02/28/13 1627 02/28/13 1920 02/28/13 2145 03/01/13 0524  BP: 191/58 199/71 188/59 169/56  Pulse: 64 72 69 76  Temp:  98.4 F (36.9 C) 99 F (37.2 C) 98.6 F (37 C)  TempSrc:  Oral Oral Oral  Resp: 20 18 20 16   SpO2: 96% 99% 100% 100%    Intake/Output Summary (Last 24 hours) at 03/01/13 0734 Last data filed at 03/01/13 6433  Gross per 24 hour  Intake      0 ml  Output   1275 ml  Net  -1275 ml    Exam: Gen:  NAD Cardiovascular:  RRR, No M/R/G Respiratory:  Lungs CTAB Gastrointestinal:  Abdomen soft, NT/ND, + BS Extremities:  No C/E/C  Data Reviewed: Basic Metabolic Panel:  Recent Labs Lab 02/28/13 1535 03/01/13 0415  NA 119* 124*  K 3.1* 3.8  CL 82* 92*  CO2 27 22  GLUCOSE 105* 102*  BUN 14 9  CREATININE 0.60 0.54  CALCIUM 9.1 8.1*   GFR The CrCl is unknown because both a height and weight (above a minimum accepted value) are required for this calculation. Liver Function Tests:  Recent Labs Lab 02/28/13 1818  AST 44*  ALT 19  ALKPHOS 17*  BILITOT 0.3  PROT 7.9  ALBUMIN 3.0*   CBC:  Recent Labs Lab 02/28/13 1535 03/01/13 0415  WBC 7.8 7.2  NEUTROABS 5.0  --   HGB 12.3 10.5*  HCT 33.9* 29.8*  MCV 81.5 82.8  PLT 326 298   Thyroid function studies  Recent Labs  02/28/13 1818  TSH 0.520   Microbiology No results found for this or any previous visit (from the past 240 hour(s)).   Procedures and Diagnostic Studies: Dg Thoracic Spine 2 View  02/28/2013   *RADIOLOGY REPORT*  Clinical Data: Back pain, dysuria.  Mid and lower back pain. Confusion.  THORACIC SPINE - 2 VIEW  Comparison: Chest x-ray 11/28/2008   Findings: There is mild convex right scoliosis of the mid thoracic spine.  Moderate degenerative change is identified.  No evidence for acute fracture or subluxation.  No evidence for posterior rib fractures.  IMPRESSION:  1.  Degenerative changes. 2. No evidence for acute  abnormality.   Original Report Authenticated By: Norva Pavlov, M.D.   Dg Lumbar Spine 2-3 Views  02/28/2013   *RADIOLOGY REPORT*  Clinical Data: Mid and lower back pain.  Confusion.  LUMBAR SPINE - 2-3 VIEW  Comparison:  Findings: There are mild degenerative changes in the spine.  No evidence for acute fracture or subluxation.  No suspicious lytic or blastic lesions are identified.  Regional bowel gas pattern is nonobstructive.  IMPRESSION: 1.  Degenerative changes.  2.  No acute fracture.   Original Report Authenticated By: Norva Pavlov, M.D.   Dg Chest Port 1 View  02/28/2013   *RADIOLOGY REPORT*  Clinical Data: Rule out pneumonia.  Back pain, shortness of breath, confusion.  PORTABLE CHEST - 1 VIEW  Comparison: 11/28/2008  Findings: Heart is enlarged.  Film is made with shallow lung inflation.  There is prominence of the interstitial markings. Kerley B lines are also suspected, consistent with mild edema. Patchy density is seen at the left lung base and right midlung zone, raising the question of early, multi focal infiltrates. Visualized bowel gas pattern is nonobstructive.  IMPRESSION:  1.  Suspect mild interstitial edema. 2.  Left lower lobe and right midlung zone infiltrates.   Original Report Authenticated By: Norva Pavlov, M.D.    Scheduled Meds: . sodium chloride   Intravenous STAT  . amLODipine  10 mg Oral Daily  . aspirin EC  81 mg Oral Daily  . gabapentin  100 mg Oral TID  . heparin  5,000 Units Subcutaneous Q8H  . losartan  100 mg Oral Daily  . nabumetone  500 mg Oral BID  . pantoprazole  40 mg Oral Daily   Continuous Infusions: . 0.9 % NaCl with KCl 40 mEq / L 100 mL/hr at 03/01/13 0981    Time spent:  35 minutes with greater than 50% of time spent updating the family regarding plan of care, diagnostic test results, diagnostic impressions.   LOS: 1 day   Elizabeth Peterson  [facility] Pager [phone].   *Please note that the hospitalists switch teams on Wednesdays. Please call the flow manager at [phone] if you are having difficulty reaching the hospitalist taking care of this patient as she can update you and provide the most up-to-date pager number of provider caring for the patient. If 8PM-8AM, please contact night-coverage at [email or web address], password [facility]  03/01/2013, 7:34 AM

## 2013-03-02 ENCOUNTER — Encounter (HOSPITAL_COMMUNITY): Payer: Self-pay | Admitting: *Deleted

## 2013-03-02 DIAGNOSIS — J189 Pneumonia, unspecified organism: Secondary | ICD-10-CM | POA: Diagnosis present

## 2013-03-02 LAB — BASIC METABOLIC PANEL
BUN: 6 mg/dL (ref 6–23)
CO2: 23 mEq/L (ref 19–32)
Chloride: 99 mEq/L (ref 96–112)
Creatinine, Ser: 0.58 mg/dL (ref 0.50–1.10)
Glucose, Bld: 92 mg/dL (ref 70–99)
Potassium: 4.9 mEq/L (ref 3.5–5.1)

## 2013-03-02 LAB — URINE CULTURE

## 2013-03-02 MED ORDER — LEVOFLOXACIN IN D5W 500 MG/100ML IV SOLN
500.0000 mg | INTRAVENOUS | Status: DC
  Administered 2013-03-02: 500 mg via INTRAVENOUS
  Filled 2013-03-02 (×2): qty 100

## 2013-03-02 NOTE — Progress Notes (Signed)
[facility] PROGRESS NOTE  Elizabeth Peterson UJW:[id number] DOB: August 31, 1919 DOA: 02/28/2013 PCP: Oliver Barre, MD  Brief narrative: Elizabeth Peterson is an 79 y.o. female with a PMH of breast cancer status post mastectomy, anemia, hypertension, anxiety and depression who presented to the hospital on 02/28/2013 with generalized weakness, anorexia, and failure to thrive. Upon initial evaluation, she was noted to have hyponatremia and hypokalemia. Chest radiography, done on admission, was positive for infiltrates the patient is also being treated for community-acquired pneumonia.  Assessment/Plan: Principal Problem:  Hyponatremia with generalized weakness, anorexia  -Zoloft discontinued in case it is contributing to hyponatremia.  -Serum osmolality low at 248, urine osmolality low at 256, and spot urine sodium 25. Differential: Hypothyroidism, glucocorticoid deficiency, SIADH, stress, or drugs. TSH within normal limits. No evidence of cortisol deficiency. Most likely induced by SIADH from possible pneumonia or a side effect of Zoloft. -Continue fluid restriction. Sodium improving with normal saline infusion. -Status post PT/OT consults. Home health PT with 24-hour supervision recommended.  Active Problems: Normocytic anemia -Likely dilutional. Underlying anemia of chronic disease likely. Severe protein calorie malnutrition / underweight  -Dietitian consultation performed 03/01/2013.  -TSH within normal limits.  Hypokalemia  -Resolved with potassium added to IV fluids.  HYPERTENSION  -Continue Norvasc and Cozaar. Blood pressure suboptimally controlled. Lasix started 03/01/2013 with improvement in blood pressure. GERD  -Continue PPI therapy.  Dementia  -24-hour supervision in the home recommended by PT/OT.  Peripheral neuropathy  -Continue Neurontin.  Back pain  -Continue Relafen and Tylenol #3 as needed.  -Plain films negative for fractures.  Anxiety and depression  -Hold Zoloft for now.   Shortness of breath / Probable CAP  -Continue bronchodilators as needed.  -Chest x-ray with some abnormalities, but pneumonia not clinically evident on admission. Given lack of clinical movement over the past 24 hours, and new reports of a cough, empiric Levaquin initiated.   Code Status: Full, confirmed with family.  Family Communication: Petrina Melby (granddaughter): [phone].  Message left for daughter, [name] [phone] Disposition Plan: Home with 24-hour supervision when stable.   Medical Consultants:  None.  Other Consultants:  None.  Anti-infectives:  Levaquin 03/02/13--->  HPI/Subjective: Elizabeth Peterson is more lethargic today. The family says she has been coughing. She remains confused and has required a safety sitter over the past 24 hours.  Objective: Filed Vitals:   03/01/13 1051 03/01/13 1319 03/01/13 2104 03/02/13 0412  BP: 169/56 122/97 134/47 166/57  Pulse:  76 68 86  Temp:  98.3 F (36.8 C) 98.2 F (36.8 C) 98.7 F (37.1 C)  TempSrc:  Oral Oral Oral  Resp:  18 18 18   Height:    5' (1.524 m)  Weight:    41.413 kg (91 lb 4.8 oz)  SpO2:  100% 98% 100%    Intake/Output Summary (Last 24 hours) at 03/02/13 0813 Last data filed at 03/02/13 0657  Gross per 24 hour  Intake 3529.34 ml  Output   3300 ml  Net 229.34 ml    Exam: Gen:  NAD Cardiovascular:  RRR, No M/R/G Respiratory:  Lungs CTAB Gastrointestinal:  Abdomen soft, NT/ND, + BS Extremities:  No C/E/C  Data Reviewed: Basic Metabolic Panel:  Recent Labs Lab 02/28/13 1535 03/01/13 0415 03/02/13 0405  NA 119* 124* 128*  K 3.1* 3.8 4.9  CL 82* 92* 99  CO2 27 22 23   GLUCOSE 105* 102* 92  BUN 14 9 6   CREATININE 0.60 0.54 0.58  CALCIUM 9.1 8.1* 8.7   GFR Estimated Creatinine  Clearance: 29.3 ml/min (by C-G formula based on Cr of 0.58). Liver Function Tests:  Recent Labs Lab 02/28/13 1818  AST 44*  ALT 19  ALKPHOS 17*  BILITOT 0.3  PROT 7.9  ALBUMIN 3.0*    CBC:  Recent Labs Lab 02/28/13 1535 03/01/13 0415  WBC 7.8 7.2  NEUTROABS 5.0  --   HGB 12.3 10.5*  HCT 33.9* 29.8*  MCV 81.5 82.8  PLT 326 298   Thyroid function studies  Recent Labs  02/28/13 1818  TSH 0.520   Microbiology No results found for this or any previous visit (from the past 240 hour(s)).   Procedures and Diagnostic Studies: Dg Thoracic Spine 2 View  02/28/2013   *RADIOLOGY REPORT*  Clinical Data: Back pain, dysuria.  Mid and lower back pain. Confusion.  THORACIC SPINE - 2 VIEW  Comparison: Chest x-ray 11/28/2008  Findings: There is mild convex right scoliosis of the mid thoracic spine.  Moderate degenerative change is identified.  No evidence for acute fracture or subluxation.  No evidence for posterior rib fractures.  IMPRESSION:  1.  Degenerative changes. 2. No evidence for acute  abnormality.   Original Report Authenticated By: Norva Pavlov, M.D.   Dg Lumbar Spine 2-3 Views  02/28/2013   *RADIOLOGY REPORT*  Clinical Data: Mid and lower back pain.  Confusion.  LUMBAR SPINE - 2-3 VIEW  Comparison:  Findings: There are mild degenerative changes in the spine.  No evidence for acute fracture or subluxation.  No suspicious lytic or blastic lesions are identified.  Regional bowel gas pattern is nonobstructive.  IMPRESSION: 1.  Degenerative changes.  2.  No acute fracture.   Original Report Authenticated By: Norva Pavlov, M.D.   Dg Chest Port 1 View  02/28/2013   *RADIOLOGY REPORT*  Clinical Data: Rule out pneumonia.  Back pain, shortness of breath, confusion.  PORTABLE CHEST - 1 VIEW  Comparison: 11/28/2008  Findings: Heart is enlarged.  Film is made with shallow lung inflation.  There is prominence of the interstitial markings. Kerley B lines are also suspected, consistent with mild edema. Patchy density is seen at the left lung base and right midlung zone, raising the question of early, multi focal infiltrates. Visualized bowel gas pattern is nonobstructive.   IMPRESSION:  1.  Suspect mild interstitial edema. 2.  Left lower lobe and right midlung zone infiltrates.   Original Report Authenticated By: Norva Pavlov, M.D.    Scheduled Meds: . amLODipine  10 mg Oral Daily  . aspirin EC  81 mg Oral Daily  . feeding supplement  237 mL Oral BID BM  . furosemide  20 mg Oral Daily  . gabapentin  100 mg Oral TID  . heparin  5,000 Units Subcutaneous Q8H  . losartan  100 mg Oral Daily  . nabumetone  500 mg Oral BID  . pantoprazole  40 mg Oral Daily   Continuous Infusions: . 0.9 % NaCl with KCl 40 mEq / L 100 mL/hr at 03/02/13 0038    Time spent: 25 minutes.   LOS: 2 days   RAMA,CHRISTINA  [facility] Pager [phone].   *Please note that the hospitalists switch teams on Wednesdays. Please call the flow manager at [phone] if you are having difficulty reaching the hospitalist taking care of this patient as she can update you and provide the most up-to-date pager number of provider caring for the patient. If 8PM-8AM, please contact night-coverage at [email or web address], password [email or web address]  03/02/2013, 8:13 AM

## 2013-03-02 NOTE — Care Management Note (Addendum)
Cm spoke with pt's great-grand-daughter Elizabeth Peterson present at bedside concerning discharge planning. PT recommendation for HHPT. Per pt's grand-daughter, her mother (pt's grand-daughter) is moving into the home. Pt currently residing at home with sister with family rotating for around the clock support. CM provided family with choice list for [facility]. CM RECEIVED PHONE CALL FROM pt's daughter Elizabeth Peterson in [place] at [phone], per family choice [facility] to provide [facility] services upon discharge. [facility] rep notified of referral. Awaiting MD orders for HHRN/PT/OT/Aide. No other needs identified.   Elizabeth Leyden, RN, BSN [phone]

## 2013-03-03 DIAGNOSIS — J309 Allergic rhinitis, unspecified: Secondary | ICD-10-CM

## 2013-03-03 DIAGNOSIS — D649 Anemia, unspecified: Secondary | ICD-10-CM

## 2013-03-03 LAB — BASIC METABOLIC PANEL
BUN: 6 mg/dL (ref 6–23)
CO2: 22 mEq/L (ref 19–32)
CO2: 27 mEq/L (ref 19–32)
Calcium: 8.5 mg/dL (ref 8.4–10.5)
Calcium: 9.3 mg/dL (ref 8.4–10.5)
Chloride: 99 mEq/L (ref 96–112)
Chloride: 92 mEq/L — ABNORMAL LOW (ref 96–112)
Creatinine, Ser: 0.7 mg/dL (ref 0.50–1.10)
Glucose, Bld: 105 mg/dL — ABNORMAL HIGH (ref 70–99)
Potassium: 4.1 mEq/L (ref 3.5–5.1)
Sodium: 127 mEq/L — ABNORMAL LOW (ref 135–145)

## 2013-03-03 MED ORDER — SODIUM POLYSTYRENE SULFONATE 15 GM/60ML PO SUSP
15.0000 g | Freq: Once | ORAL | Status: AC
  Administered 2013-03-03: 15 g via ORAL
  Filled 2013-03-03: qty 60

## 2013-03-03 MED ORDER — SODIUM CHLORIDE 0.9 % IV SOLN
INTRAVENOUS | Status: DC
  Administered 2013-03-03: 06:00:00 via INTRAVENOUS

## 2013-03-03 MED ORDER — OXYCODONE-ACETAMINOPHEN 5-325 MG PO TABS
1.0000 | ORAL_TABLET | ORAL | Status: DC | PRN
  Administered 2013-03-03 – 2013-03-04 (×2): 1 via ORAL
  Filled 2013-03-03: qty 1
  Filled 2013-03-03: qty 2

## 2013-03-03 MED ORDER — LEVOFLOXACIN 500 MG PO TABS
500.0000 mg | ORAL_TABLET | Freq: Every day | ORAL | Status: DC
  Administered 2013-03-03: 500 mg via ORAL
  Filled 2013-03-03 (×2): qty 1

## 2013-03-03 MED ORDER — FUROSEMIDE 10 MG/ML IJ SOLN
40.0000 mg | INTRAMUSCULAR | Status: AC
  Administered 2013-03-03: 40 mg via INTRAVENOUS
  Filled 2013-03-03: qty 4

## 2013-03-03 MED ORDER — DIPHENHYDRAMINE HCL 50 MG PO CAPS
50.0000 mg | ORAL_CAPSULE | Freq: Once | ORAL | Status: AC
  Administered 2013-03-03: 50 mg via ORAL
  Filled 2013-03-03: qty 1

## 2013-03-03 MED ORDER — SODIUM POLYSTYRENE SULFONATE 15 GM/60ML PO SUSP
45.0000 g | Freq: Once | ORAL | Status: AC
  Administered 2013-03-03: 45 g via ORAL
  Filled 2013-03-03: qty 180

## 2013-03-03 NOTE — Progress Notes (Signed)
Patient ID: Elizabeth Peterson, female   DOB: May 02, 1919, 79 y.o.   MRN: [id number]  [facility] PROGRESS NOTE  [name] AOZ:[id number] DOB: 09/19/1919 DOA: 02/28/2013 PCP: Oliver Barre, MD  Brief narrative:  Elizabeth Peterson is an 79 y.o. female with a PMH of breast cancer status post mastectomy, anemia, hypertension, anxiety and depression who presented to the hospital on 02/28/2013 with generalized weakness, anorexia, and failure to thrive. Upon initial evaluation, she was noted to have hyponatremia and hypokalemia. Chest radiography, done on admission, was positive for infiltrates the patient is also being treated for community-acquired pneumonia.    Assessment/Plan:  Principal Problem:  Hyponatremia with generalized weakness, anorexia  -Zoloft discontinued in case it is contributing to hyponatremia.  -Serum osmolality low at 248, urine osmolality low at 256, and spot urine sodium 25. Differential: Hypothyroidism, glucocorticoid deficiency, SIADH, stress, or drugs. TSH within normal limits. No evidence of cortisol deficiency. Most likely induced by SIADH from possible pneumonia or a side effect of Zoloft.  -Continue fluid restriction. Sodium improving with normal saline infusion.  -Home health PT with 24-hour supervision recommended.  Active Problems:  Normocytic anemia  -Likely dilutional. Underlying anemia of chronic disease likely. He will remains stable at patient's baseline, will repeat CBC in the morning Severe protein calorie malnutrition / underweight  -Dietitian consultation performed 03/01/2013.  -TSH within normal limits.  Hypokalemia  -Resolved with potassium added to IV fluids but this morning potassium 6.6. We will discontinue potassium supplementation at this time and will give one dose of Kayexalate, repeat BMP today and repeat BMP in the morning. HYPERTENSION  -Continue Norvasc and Cozaar. Blood pressure suboptimally controlled. Lasix started 03/01/2013, reasonable  inpatient control GERD  -Continue PPI therapy.  Dementia  -24-hour supervision in the home recommended by PT/OT.  Peripheral neuropathy  -Continue Neurontin.  Back pain  -Continue Relafen and Tylenol #3 as needed.  -Plain films negative for fractures.  Anxiety and depression  -Hold Zoloft for now.  Shortness of breath / Probable CAP  -Continue bronchodilators as needed.  -Chest x-ray with some abnormalities, but pneumonia not clinically evident on admission. Continue empiric Levaquin for now  Code Status: Full, confirmed with family.  Family Communication: [name] (granddaughter): [phone]. Message left for daughter, [name] [phone]  Disposition Plan: Home with 24-hour supervision when stable.   Medical Consultants:  None. Other Consultants:  None. Anti-infectives:  Levaquin 03/02/13--->  HPI/Subjective: No events overnight.   Objective: Filed Vitals:   03/03/13 0420 03/03/13 0600 03/03/13 1032 03/03/13 1416  BP: 164/58 132/69 143/61 156/71  Pulse: 60 75  68  Temp: 98.3 F (36.8 C) 97.5 F (36.4 C)  98.5 F (36.9 C)  TempSrc: Oral Oral  Oral  Resp: 16 15  16   Height:      Weight:      SpO2: 100% 93%  99%    Intake/Output Summary (Last 24 hours) at 03/03/13 1652 Last data filed at 03/03/13 0850  Gross per 24 hour  Intake    290 ml  Output   2300 ml  Net  -2010 ml    Exam:   General:  Pt is alert, follows commands appropriately, not in acute distress  Cardiovascular: Regular rate and rhythm, S1/S2, no murmurs, no rubs, no gallops  Respiratory: Clear to auscultation bilaterally, no wheezing, decreased breath sounds at bases   Abdomen: Soft, non tender, non distended, bowel sounds present, no guarding  Extremities: No edema, pulses DP and PT palpable bilaterally  Neuro: Grossly nonfocal  Data Reviewed: Basic Metabolic Panel:  Recent Labs Lab 02/28/13 1535 03/01/13 0415 03/02/13 0405 03/03/13 0400  NA 119* 124* 128* 126*  K  3.1* 3.8 4.9 6.6*  CL 82* 92* 99 99  CO2 27 22 23 22   GLUCOSE 105* 102* 92 105*  BUN 14 9 6 8   CREATININE 0.60 0.54 0.58 0.70  CALCIUM 9.1 8.1* 8.7 8.5   Liver Function Tests:  Recent Labs Lab 02/28/13 1818  AST 44*  ALT 19  ALKPHOS 17*  BILITOT 0.3  PROT 7.9  ALBUMIN 3.0*   CBC:  Recent Labs Lab 02/28/13 1535 03/01/13 0415  WBC 7.8 7.2  NEUTROABS 5.0  --   HGB 12.3 10.5*  HCT 33.9* 29.8*  MCV 81.5 82.8  PLT 326 298    Recent Results (from the past 240 hour(s))  URINE CULTURE     Status: None   Collection Time    02/28/13  6:29 PM      Result Value Range Status   Specimen Description URINE, CATHETERIZED   Final   Special Requests NONE   Final   Culture  Setup Time 03/01/2013 01:39   Final   Colony Count 85,000 COLONIES/ML   Final   Culture     Final   Value: LACTOBACILLUS SPECIES     Note: Standardized susceptibility testing for this organism is not available.   Report Status 03/02/2013 FINAL   Final     Scheduled Meds: . amLODipine  10 mg Oral Daily  . aspirin EC  81 mg Oral Daily  . feeding supplement  237 mL Oral BID BM  . furosemide  20 mg Oral Daily  . gabapentin  100 mg Oral TID  . heparin  5,000 Units Subcutaneous Q8H  . levofloxacin  500 mg Oral Daily  . losartan  100 mg Oral Daily  . nabumetone  500 mg Oral BID  . pantoprazole  40 mg Oral Daily   Continuous Infusions:    Debbora Presto, MD  [facility] Pager [phone]  If 7PM-7AM, please contact night-coverage [email or web address] Password [email or web address] 03/03/2013, 4:52 PM   LOS: 3 days

## 2013-03-03 NOTE — Progress Notes (Signed)
Pt had a critical potassium level of 6.6. Md notified. Orders were given. EKG was done and normal results was read back to MD. Will report off to morning nurse and will continue to monitor closely.

## 2013-03-04 LAB — BASIC METABOLIC PANEL
CO2: 27 mEq/L (ref 19–32)
Chloride: 91 mEq/L — ABNORMAL LOW (ref 96–112)
Creatinine, Ser: 0.81 mg/dL (ref 0.50–1.10)

## 2013-03-04 LAB — CBC
HCT: 32.6 % — ABNORMAL LOW (ref 36.0–46.0)
MCV: 85.3 fL (ref 78.0–100.0)
Platelets: 335 10*3/uL (ref 150–400)
RBC: 3.82 MIL/uL — ABNORMAL LOW (ref 3.87–5.11)
WBC: 8.1 10*3/uL (ref 4.0–10.5)

## 2013-03-04 MED ORDER — LEVOFLOXACIN 250 MG PO TABS
250.0000 mg | ORAL_TABLET | Freq: Every day | ORAL | Status: DC
  Administered 2013-03-04: 250 mg via ORAL
  Filled 2013-03-04: qty 1

## 2013-03-04 MED ORDER — POTASSIUM CHLORIDE CRYS ER 20 MEQ PO TBCR
40.0000 meq | EXTENDED_RELEASE_TABLET | Freq: Once | ORAL | Status: AC
  Administered 2013-03-04: 40 meq via ORAL
  Filled 2013-03-04: qty 2

## 2013-03-04 MED ORDER — POTASSIUM CHLORIDE ER 10 MEQ PO TBCR: 20.0000 meq | EXTENDED_RELEASE_TABLET | Freq: Two times a day (BID) | ORAL | Status: DC

## 2013-03-04 MED ORDER — FUROSEMIDE 20 MG PO TABS: 20.0000 mg | ORAL_TABLET | Freq: Every day | ORAL | Status: DC

## 2013-03-04 MED ORDER — POTASSIUM CHLORIDE ER 20 MEQ PO TBCR: 20.0000 meq | EXTENDED_RELEASE_TABLET | Freq: Every day | ORAL | Status: DC

## 2013-03-04 MED ORDER — OXYCODONE-ACETAMINOPHEN 5-325 MG PO TABS: 1.0000 | ORAL_TABLET | ORAL | Status: DC | PRN

## 2013-03-04 MED ORDER — LEVOFLOXACIN 250 MG PO TABS: 250.0000 mg | ORAL_TABLET | Freq: Every day | ORAL | Status: DC

## 2013-03-04 NOTE — Progress Notes (Signed)
Patient discharged to home with family, dischahrge instructions reviewed with granddaughter who verbalized understanding and new RX's given.

## 2013-03-04 NOTE — Discharge Summary (Signed)
Physician Discharge Summary  Elizabeth Peterson ZOX:[id number] DOB: [birth year] DOA: 02/28/2013  PCP: Oliver Barre, MD  Admit date: 02/28/2013 Discharge date: 03/04/2013  Recommendations for Outpatient Follow-up:  1. Pt will need to follow up with PCP in 2-3 weeks post discharge 2. Please obtain BMP to evaluate electrolytes and kidney function, potassium level, sodium level 3. Monitor sodium closely as pt was started on Lasix during this admission  4. Please note that pt has received one dose of K-dur 40 MEQ prior to discharge 5. Please also check CBC to evaluate Hg and Hct levels 6. Please note that pt was discharged on Levaquin to complete the therapy for 7 more days post discharge 7. Please consider continuation or discontinuation of Zoloft, we have temporarily held it but family insisting on continuing on discharge and discussing with PCP first   Discharge Diagnoses: Generalized weakness secondary to PNA, hyponatremia and deconditioning  Principal Problem:   Hyponatremia with generalized weakness, anorexia Active Problems:   HYPERTENSION   GERD   Dementia   Peripheral neuropathy   Back pain   Anxiety and depression   Shortness of breath   Severe protein-calorie malnutrition   Underweight   Hypokalemia   Normocytic anemia   CAP (community acquired pneumonia)  Discharge Condition: Stable  Diet recommendation: Heart healthy diet discussed in details   Brief narrative:  Elizabeth Peterson is an 79 y.o. female with a PMH of breast cancer status post mastectomy, anemia, hypertension, anxiety and depression who presented to the hospital on 02/28/2013 with generalized weakness, anorexia, and failure to thrive. Upon initial evaluation, she was noted to have hyponatremia and hypokalemia. Chest radiography, done on admission, was positive for infiltrates the patient is also being treated for community-acquired pneumonia.   Assessment/Plan:  Principal Problem:  Hyponatremia with generalized  weakness, anorexia  -Zoloft discontinued in case it is contributing to hyponatremia. Family insisting on continuing on discharge and discussing with PCP first, will forward this note to PCP.  -Serum osmolality low at 248, urine osmolality low at 256, and spot urine sodium 25. Differential: Hypothyroidism, glucocorticoid deficiency, SIADH, stress, or drugs. TSH within normal limits. No evidence of cortisol deficiency. Most likely induced by SIADH from possible pneumonia or a side effect of Zoloft.  -Continue fluid restriction and this was discussed with family as well. Sodium improving with normal saline infusion.  -Home health PT with 24-hour supervision recommended.  Active Problems:  Normocytic anemia  -Likely dilutional. Underlying anemia of chronic disease likely. Hg remains stable at patient's baseline. Severe protein calorie malnutrition / underweight  -Dietitian consultation performed 03/01/2013.  -TSH within normal limits.  Hypokalemia  -Resolved with potassium added to IV fluids but potassium increased to 6.6. IVF with potassium discontinued and pt given oral KDUR 40 MEW piror to discharge x 1 dose. -PT will continue to take supplemental potassium as per her home regimen.  HYPERTENSION  -Continue Norvasc and Cozaar. Blood pressure suboptimally controlled. Lasix started 03/01/2013, reasonable inpatient control  GERD  -Continue PPI therapy.  Dementia  -24-hour supervision in the home recommended by PT/OT.  Peripheral neuropathy  -Continue Neurontin.  Back pain  -Continue Relafen and Tylenol #3 as needed.  -Plain films negative for fractures.  Anxiety and depression  -Continue Zoloft upon discharge  Shortness of breath / Probable CAP  -Continue bronchodilators as needed.  -Chest x-ray with some abnormalities, pt responding well to ABX, less cough -Will continue Levaquin upon discharge for 7 more days  Code Status: Full, confirmed with family.  Family  Communication: Elizabeth Peterson  (granddaughter): [phone]. Message left for daughter, Elizabeth Peterson [phone]   Medical Consultants:  None. Other Consultants:  None. Anti-infectives:  Levaquin 03/02/13---> 7 more days post discharge  Discharge Exam: Filed Vitals:   03/04/13 0455  BP: 156/64  Pulse: 72  Temp: 98.8 F (37.1 C)  Resp: 16   Filed Vitals:   03/03/13 1032 03/03/13 1416 03/03/13 2110 03/04/13 0455  BP: 143/61 156/71 147/55 156/64  Pulse:  68 70 72  Temp:  98.5 F (36.9 C) 98.6 F (37 C) 98.8 F (37.1 C)  TempSrc:  Oral Oral Oral  Resp:  16 16 16   Height:      Weight:      SpO2:  99% 100% 98%    General: Pt is alert, follows commands appropriately, not in acute distress Cardiovascular: Regular rate and rhythm, S1/S2 +, no murmurs, no rubs, no gallops Respiratory: Clear to auscultation bilaterally, no wheezing, no crackles, no rhonchi Abdominal: Soft, non tender, non distended, bowel sounds +, no guarding  Discharge Instructions  Discharge Orders   Future Appointments Provider Department Dept Phone   06/03/2013 1:30 PM Corwin Levins, MD [facility] [phone]   Future Orders Complete By Expires     Diet - low sodium heart healthy  As directed     Increase activity slowly  As directed         Medication List    STOP taking these medications       acetaminophen-codeine 300-30 MG per tablet  Commonly known as:  TYLENOL #3      TAKE these medications       amLODipine 10 MG tablet  Commonly known as:  NORVASC  Take 10 mg by mouth daily.     aspirin 81 MG EC tablet  Take 81 mg by mouth daily.     furosemide 20 MG tablet  Commonly known as:  LASIX  Take 1 tablet (20 mg total) by mouth daily.     gabapentin 100 MG capsule  Commonly known as:  NEURONTIN  Take 1 capsule (100 mg total) by mouth 3 (three) times daily.     levofloxacin 250 MG tablet  Commonly known as:  LEVAQUIN  Take 1 tablet (250 mg total) by mouth daily.     losartan 100 MG  tablet  Commonly known as:  COZAAR  Take 1 tablet (100 mg total) by mouth daily.     nabumetone 500 MG tablet  Commonly known as:  RELAFEN  Take 500 mg by mouth 2 (two) times daily.     oxyCODONE-acetaminophen 5-325 MG per tablet  Commonly known as:  PERCOCET/ROXICET  Take 1-2 tablets by mouth every 4 (four) hours as needed for pain.     polyethylene glycol packet  Commonly known as:  MIRALAX / GLYCOLAX  Take 17 g by mouth daily as needed (for constipation).     Potassium Chloride ER 20 MEQ Tbcr  Take 20 mEq by mouth daily.     PRILOSEC 20 MG capsule  Generic drug:  omeprazole  Take 20 mg by mouth 2 (two) times daily.     sertraline 50 MG tablet  Commonly known as:  ZOLOFT  Take 100 mg by mouth daily.     VENTOLIN HFA 108 (90 BASE) MCG/ACT inhaler  Generic drug:  albuterol  Inhale 2 puffs into the lungs every 4 (four) hours as needed for wheezing or shortness of breath.  Follow-up Information   Follow up with Oliver Barre, MD In 2 weeks.   Contact information:   [address] [place] [address] [phone]        The results of significant diagnostics from this hospitalization (including imaging, microbiology, ancillary and laboratory) are listed below for reference.     Microbiology: Recent Results (from the past 240 hour(s))  URINE CULTURE     Status: None   Collection Time    02/28/13  6:29 PM      Result Value Range Status   Specimen Description URINE, CATHETERIZED   Final   Special Requests NONE   Final   Culture  Setup Time 03/01/2013 01:39   Final   Colony Count 85,000 COLONIES/ML   Final   Culture     Final   Value: LACTOBACILLUS SPECIES     Note: Standardized susceptibility testing for this organism is not available.   Report Status 03/02/2013 FINAL   Final     Labs: Basic Metabolic Panel:  Recent Labs Lab 03/01/13 0415 03/02/13 0405 03/03/13 0400 03/03/13 1654 03/04/13 0450  NA 124* 128* 126* 127* 127*  K 3.8 4.9  6.6* 4.1 3.2*  CL 92* 99 99 92* 91*  CO2 22 23 22 27 27   GLUCOSE 102* 92 105* 136* 105*  BUN 9 6 8 6 8   CREATININE 0.54 0.58 0.70 0.69 0.81  CALCIUM 8.1* 8.7 8.5 9.3 8.8   Liver Function Tests:  Recent Labs Lab 02/28/13 1818  AST 44*  ALT 19  ALKPHOS 17*  BILITOT 0.3  PROT 7.9  ALBUMIN 3.0*   CBC:  Recent Labs Lab 02/28/13 1535 03/01/13 0415 03/04/13 0450  WBC 7.8 7.2 8.1  NEUTROABS 5.0  --   --   HGB 12.3 10.5* 11.3*  HCT 33.9* 29.8* 32.6*  MCV 81.5 82.8 85.3  PLT 326 298 335   SIGNED: Time coordinating discharge: Over 30 minutes  Debbora Presto, MD  [facility] 03/04/2013, 11:49 AM Pager [phone]  If 7PM-7AM, please contact night-coverage [email or web address] Password [email or web address]

## 2013-03-04 NOTE — Care Management Note (Addendum)
CARE MANAGEMENT NOTE 03/04/2013  Patient:  Elizabeth Peterson,Elizabeth Peterson   Account Number:  [id number]  Date Initiated:  03/01/2013  Documentation initiated by:  McGIBBONEY,COOKIE  Subjective/Objective Assessment:   pt admitted with cco confusion, weakness, anorexia, fatigue, low NA     Action/Plan:   from home   Anticipated DC Date:  03/01/2013   Anticipated DC Plan:  HOME W HOME HEALTH SERVICES      DC Planning Services  CM consult      Choice offered to / List presented to:  C-4 Adult Children   DME arranged  NA      DME agency  NA     HH arranged  HH-2 PT  HH-1 RN      Status of service:  Completed, signed off Medicare Important Message given?   (If response is "NO", the following Medicare IM given date fields will be blank) Date Medicare IM given:   Date Additional Medicare IM given:    Discharge Disposition:    Per UR Regulation:  Reviewed for med. necessity/level of care/duration of stay  If discussed at Long Length of Stay Meetings, dates discussed:    Comments:  03/04/13 1213 Shamere Campas,RN,BSN [phone] Cm received call from CSW notifying Cm of telephone call to CSW DEpt from pt's son. Cm spoke with pt's great-grand-daughter Deneise Lever with other grandaughter present at bedside concerning phonecall from pt's son Will. Per [employer] phonecall concerned pt's potential dc on 03/03/13 where concerns voiced to CM. CM voiced concerns to MD,pt remained inpt overnight. Per family at bedside dc plan to residence with [facility] services today agreed upon. Family informed start of care scheduled for 03/05/13. No other needs identified at this time. Family to transport pt home. family requested BSc prior to dc, present t bedside during consult.  03/02/13 1209 Kyrel Leighton,RN,BSN [phone] Cm spoke with pt's great-grand-daughter Waynard Edwards present at bedside concerning discharge planning. PT recommendation for HHPT. Per pt's grand-daughter, her mother (pt's grand-daughter) is moving into the home. Pt  currently residing at home with sister with family rotating for around the clock support. CM provided family with choice list for [facility]. CM RECEIVED PHONE CALL FROM pt's daughter Mia Creek in [place] at [phone], per family choice [facility] to provide [facility] services upon discharge. [facility] rep notified of referral. Awaiting MD orders for HHRN/PT/OT/Aide. No other needs identified  02/28/13 MMcGibboney, RN, BSN Chart reviewed.

## 2013-03-04 NOTE — Progress Notes (Signed)
Physical Therapy Treatment Patient Details Name: Elizabeth Peterson MRN: [id number] DOB: [birth year] Today's Date: 03/04/2013 Time: 0922-0950 PT Time Calculation (min): 28 min  PT Assessment / Plan / Recommendation Comments on Treatment Session  Family plans to take pt home.  Upon entering room daughter was asleep on window bench and Granddaughter was asleep in recliner.  Pt awake and read the paper.  Assisted pt OOB to have her amb a short distance to BR.  Pt required Mod Assist with RW with a very slow gait and required repeat cueing to stay on task.  Very HIGH FALL risk as pt did not complete her turns prior to sit and struggled to step back enough to reach target (toilet/bed).  Pt stated she "had" a walker but gave it away.  Advised pt she would need to amb with the walker and with assistance when she gets home.  All throughout session, family members continued to sleep and when asked about home equipment the daughter cont to lay there facing the window offering little information.   Pt is NOT strong or steady enough to amb from the car and into the house.   Pt needs a RW and & WC but unknown if she already has.  3:1 delivered and in room.  Left pt in bed with RN in room.    Follow Up Recommendations  Home health PT     Does the patient have the potential to tolerate intense rehabilitation     Barriers to Discharge        Equipment Recommendations  Rolling walker with 5" wheels;Wheelchair (measurements PT) (Varify with family if they need to order these or if they al)    Recommendations for Other Services    Frequency Min 3X/week   Plan      Precautions / Restrictions Precautions Precautions: Fall Restrictions Weight Bearing Restrictions: No   Pertinent Vitals/Pain C/o upper back pain did not rate    Mobility  Bed Mobility Bed Mobility: Supine to Sit;Sitting - Scoot to Edge of Bed;Sit to Supine Supine to Sit: 4: Min assist Sitting - Scoot to [place] of Bed: 4: Min assist Details  for Bed Mobility Assistance: increased time and repeat cueing to stay on task Transfers Transfers: Sit to Stand;Stand to Sit Sit to Stand: 3: Mod assist;2: Max assist;From toilet;From bed Stand to Sit: 2: Max assist;To bed;To toilet Details for Transfer Assistance: 75% VC's on proper hand placement and repeat cueing to stay on task.  Stand to sit was more difficulty as pt demon incomplete turns and difficulty comprehending backward gait for optimal safety. Ambulation/Gait Ambulation/Gait Assistance: 3: Mod assist Ambulation Distance (Feet): 16 Feet (8 feet x 2 to and from BR only and barely made it) Assistive device: Rolling walker Ambulation/Gait Assistance Details: Mod Assist with 75% VC's on proper walker to self distance as pt tended to push too far out front.  Also repeat cueing to increase step length.  Pt barely made it back to the bed due to fatigue/weakness.  Gait Pattern: Step-to pattern;Step-through pattern;Trunk flexed Gait velocity: decreased  HIGH FALL RISK     PT Goals    Visit Information  Last PT Received On: 03/04/13 Assistance Needed: +2    Subjective Data      Cognition    slow   Balance   poor  End of Session PT - End of Session Equipment Utilized During Treatment: Gait belt Activity Tolerance: Patient limited by fatigue Patient left: in bed;with family/visitor present (daughter was present but  chose to sleep)   Felecia Shelling  PTA [facility]  Acute  Rehab Pager      [phone]

## 2013-03-04 NOTE — Progress Notes (Signed)
CSW received notification that pt son, Cyriah Childrey has left voice message on Clinical Social Work Department voice mail, requesting a phone call.  CSW reviewed chart and noted that plan is for pt to return home with home health and family assistance.  CSW discussed with RNCM who stated that pt sister is HCPOA and pt granddaughter and great granddaughter are usually at bedside and RNCM confirmed that RNCM has been in communication with pt HCPOA re: disposition plan.   CSW and RNCM visited pt room where pt granddaughter and great granddaughter were present at bedside. CSW discussed that pt son had left voice message on Clinical Social Work Department voice mailbox yesterday. Pt granddaughter and pt great granddaughter reported that pt son voice message was likely re: concerns from yesterday when pt family was concerned pt was being discharge to early. Pt granddaughter and great granddaughter report that they are in communication with pt son and do not feel CSW needs to contact pt son. RNCM explained that given pt son is not HCPOA and not present at bedside, pt medical information cannot be provided to pt son via telephone.  Pt family at bedside did not identify and Clinical Social Worker concerns.  Pt family at bedside plans for pt to return home today with [facility] and pt family assistance.  No social work needs identified.  CSW signed off.   Jacklynn Lewis, MSW, LCSWA  Clinical Social Work [phone]

## 2013-03-09 ENCOUNTER — Other Ambulatory Visit: Payer: Self-pay | Admitting: Internal Medicine

## 2013-03-16 DIAGNOSIS — E46 Unspecified protein-calorie malnutrition: Secondary | ICD-10-CM

## 2013-03-16 DIAGNOSIS — E871 Hypo-osmolality and hyponatremia: Secondary | ICD-10-CM

## 2013-03-16 DIAGNOSIS — F0390 Unspecified dementia without behavioral disturbance: Secondary | ICD-10-CM

## 2013-03-16 DIAGNOSIS — R279 Unspecified lack of coordination: Secondary | ICD-10-CM

## 2013-03-22 ENCOUNTER — Telehealth: Payer: Self-pay

## 2013-03-22 NOTE — Telephone Encounter (Signed)
PT Elizabeth Peterson to inform granddaughter canceled PT appoiintment this past Saturday as she did not want a female therapist to see the patient.  No need to call back just a FYI for PCP.

## 2013-03-25 ENCOUNTER — Telehealth: Payer: Self-pay | Admitting: *Deleted

## 2013-03-25 NOTE — Telephone Encounter (Signed)
HHRN informed 

## 2013-03-25 NOTE — Telephone Encounter (Signed)
Scott from [facility] called requesting order to continue home health once a week until August and also for home health aide to continue to come out twice a week.  Please advise

## 2013-03-25 NOTE — Telephone Encounter (Signed)
Ok for verbal 

## 2013-05-03 ENCOUNTER — Encounter: Payer: Self-pay | Admitting: Internal Medicine

## 2013-05-03 ENCOUNTER — Encounter: Payer: Self-pay | Admitting: *Deleted

## 2013-05-03 ENCOUNTER — Ambulatory Visit (INDEPENDENT_AMBULATORY_CARE_PROVIDER_SITE_OTHER): Payer: Medicare Other | Admitting: Internal Medicine

## 2013-05-03 VITALS — BP 156/84 | HR 69 | Temp 98.1°F | Resp 14 | Ht 60.0 in | Wt 93.6 lb

## 2013-05-03 DIAGNOSIS — E43 Unspecified severe protein-calorie malnutrition: Secondary | ICD-10-CM

## 2013-05-03 DIAGNOSIS — F0390 Unspecified dementia without behavioral disturbance: Secondary | ICD-10-CM

## 2013-05-03 DIAGNOSIS — G629 Polyneuropathy, unspecified: Secondary | ICD-10-CM

## 2013-05-03 DIAGNOSIS — R634 Abnormal weight loss: Secondary | ICD-10-CM

## 2013-05-03 DIAGNOSIS — E871 Hypo-osmolality and hyponatremia: Secondary | ICD-10-CM

## 2013-05-03 DIAGNOSIS — I70219 Atherosclerosis of native arteries of extremities with intermittent claudication, unspecified extremity: Secondary | ICD-10-CM

## 2013-05-03 DIAGNOSIS — G609 Hereditary and idiopathic neuropathy, unspecified: Secondary | ICD-10-CM

## 2013-05-03 DIAGNOSIS — E876 Hypokalemia: Secondary | ICD-10-CM

## 2013-05-03 DIAGNOSIS — F341 Dysthymic disorder: Secondary | ICD-10-CM

## 2013-05-03 DIAGNOSIS — F329 Major depressive disorder, single episode, unspecified: Secondary | ICD-10-CM

## 2013-05-03 DIAGNOSIS — F32A Depression, unspecified: Secondary | ICD-10-CM

## 2013-05-03 NOTE — Progress Notes (Signed)
Patient ID: Elizabeth Peterson, female   DOB: 1919-09-18, 79 y.o.   MRN: [id number] Location:  [facility] [place] [facility] Office  Code Status: discussed with pt and her daughter today--given blank living will paper and she will also consider doing HCPOA paperwork   Allergies  Allergen Reactions  . Amlodipine Besylate     REACTION: rash and itch  . Benzodiazepines Other (See Comments)    Hx of overuse/memory issue  . Penicillins   . Sulfonamide Derivatives     Chief Complaint  Patient presents with  . NP to Establish    HPI: Patient is a 79 y.o. female seen in the office today to establish with the practice.    Pt says she is not good.  Cannot rest in the evening and night--is up all night frequently.  Tries to get up in the middle of the night to cook.  Is on a lot of medications at this time and her daughter, [name], wonders if all are necessary at this point.  Has very small appetite.  Has always been light, but now almost needs to be fed to eat.  Did not have meds this am--niece, Pam, didn't give b/c she had not eaten breakfast.    Worst pain is due to peripheral neuropathy of her legs per prior physician--is on gabapentin for this.  Doesn't seem to be working.  Will cry out at times with this pain.  Pain is from knees down.  Daughter wonders if statins were cause.  Says it's an aching pain.  At times, feels like feet are numb, but not often.  Has had longstanding high blood pressure.  No known strokes from high blood pressure.  Had rash from amlodipine in the past.    Back pain--comes and goes.  Midback.  Has had falls--has them periodically  June hospitalization--was standing and just falling--then had skilled nursing at home, pt, ot--fewer falls since.  Ultimately in hositalization, found to have pneumonia and treated with levaquin and also started on lasix.  Has not been back to Dr. Jonny Ruiz since hospitalization. Needs f/u of K and Na done.  Hyponatremia also  during hospitalization with the pneumonia.    Not lately having difficulty with GERD.    Anemia--last hgb 11.5.    Says mood is not bad.  Mood has improved--initially intense and scary, but now balanced out and more like herself now.    Has malnutrition due to her poor po intake.  Pt says memory is not bad.  Noticed first about 3 years ago.  Had Christmas at daughter's home in [place] ([name]picked up pt and other family at train station--she got very confused and disoriented.  Took about an hour for her to get back to it after breakfast and a bath--was going in and out of alertness and orientation.  6-8 mos later, pt's son got married, same thing happened.  Noticed, this happened when left home.  Also occurred with hospitalization.  Now a little better since leaving hospital.  Was having visual hallucinations--did not tolerate zoloft or xanax due to this--was seeing her deceased husband.  Went to a neurologist at one point and he took her off the xanax and he put her on zoloft but still had them.  Hospital physician stopped it.  Has had anxiety and depression for much of her life.    Does her own bathing, dressing, grooming.  Niece does help her--prompts her or assists a little with the dressing.  No urinary or  fecal incontinence.  Some urgency to urination.    Says if she goes to bed early at 630pm and then wakes up.  If goes later 930pm, she sleeps at night.  Says she goes to bed when by herself and bored.    Was never on memory pills.  Needs MMSE.  Has had some loss of inhibition and strips at times.  Sister in 64s and niece in 34s live with her.  Review of Systems:  Review of Systems  Constitutional: Positive for chills and weight loss.       Loss of appetite  HENT: Positive for hearing loss.        Wears dentures  Eyes: Negative for blurred vision.  Respiratory: Positive for cough. Negative for shortness of breath.   Cardiovascular: Negative for chest pain, palpitations and leg  swelling.  Gastrointestinal: Positive for constipation. Negative for blood in stool and melena.  Genitourinary: Positive for urgency. Negative for dysuria and frequency.  Musculoskeletal: Positive for myalgias, back pain, joint pain and falls.  Skin: Positive for itching. Negative for rash.  Neurological: Positive for sensory change and weakness. Negative for dizziness, tingling, tremors, focal weakness, seizures and loss of consciousness.       Loss of balance  Endo/Heme/Allergies:       Cold intolerance  Psychiatric/Behavioral: Positive for depression and memory loss. The patient is nervous/anxious and has insomnia.        Mood swings, paranoia    Past Medical History  Diagnosis Date  . HYPERLIPIDEMIA 04/16/2007  . ANEMIA-NOS 04/16/2007  . ANXIETY 04/16/2007  . DEPRESSION 09/03/2007  . HYPERTENSION 04/16/2007  . ATHEROSLERO NATV ART EXTREM W/INTERMIT CLAUDICAT 06/05/2010  . ALLERGIC RHINITIS 02/27/2009  . PNEUMONIA, COMMUNITY ACQUIRED, PNEUMOCOCCAL 12/07/2008  . ASTHMA 09/03/2007  . GERD 04/16/2007  . DIVERTICULOSIS, COLON 09/03/2007  . MENOPAUSAL DISORDER 04/18/2008  . OSTEOARTHRITIS, KNEE, LEFT 11/24/2009  . JOINT EFFUSION, LEFT KNEE 02/27/2009  . KNEE PAIN, BILATERAL 06/01/2010  . LOW BACK PAIN 04/16/2007  . LEG PAIN, LEFT 06/01/2010  . WEIGHT LOSS 07/25/2008  . RASH-NONVESICULAR 07/25/2008  . Abdominal pain, generalized 09/03/2007  . BREAST CANCER, HX OF 04/16/2007  . COLONIC POLYPS, HX OF 09/03/2007  . Dementia 06/17/2012    mild  . Peripheral neuropathy 06/17/2012    Past Surgical History  Procedure Laterality Date  . Abdominal hysterectomy  1966  . Tubal ligation    . S/p left mastectomy  2011    Social History:   reports that she has never smoked. She has never used smokeless tobacco. She reports that she does not drink alcohol or use illicit drugs.  Family History  Problem Relation Age of Onset  . Cancer Sister     Breast  . Hypertension Other   . Diabetes Other     1st  degree relative  . Hypertension Maternal Grandmother     Medications: Patient's Medications  New Prescriptions   No medications on file  Previous Medications   AMLODIPINE (NORVASC) 10 MG TABLET    TAKE 1 TABLET BY MOUTH EVERY DAY   ASPIRIN 81 MG EC TABLET    Take 81 mg by mouth daily.     FUROSEMIDE (LASIX) 20 MG TABLET    Take 1 tablet (20 mg total) by mouth daily.   GABAPENTIN (NEURONTIN) 100 MG CAPSULE    Take 1 capsule (100 mg total) by mouth 3 (three) times daily.   KLOR-CON M20 20 MEQ TABLET    Take 20 mEq by mouth  daily.    LOSARTAN (COZAAR) 100 MG TABLET    Take 1 tablet (100 mg total) by mouth daily.   NABUMETONE (RELAFEN) 500 MG TABLET    Take 500 mg by mouth 2 (two) times daily.   POLYETHYLENE GLYCOL (MIRALAX / GLYCOLAX) PACKET    Take 17 g by mouth daily as needed (for constipation).  Modified Medications   No medications on file  Discontinued Medications   ALBUTEROL (VENTOLIN HFA) 108 (90 BASE) MCG/ACT INHALER    Inhale 2 puffs into the lungs every 4 (four) hours as needed for wheezing or shortness of breath.    AMLODIPINE (NORVASC) 10 MG TABLET    Take 10 mg by mouth daily.   LEVOFLOXACIN (LEVAQUIN) 250 MG TABLET    Take 1 tablet (250 mg total) by mouth daily.   OMEPRAZOLE (PRILOSEC) 20 MG CAPSULE    Take 20 mg by mouth 2 (two) times daily.    OXYCODONE-ACETAMINOPHEN (PERCOCET/ROXICET) 5-325 MG PER TABLET    Take 1-2 tablets by mouth every 4 (four) hours as needed for pain.   POTASSIUM CHLORIDE 20 MEQ TBCR    Take 20 mEq by mouth daily.   SERTRALINE (ZOLOFT) 50 MG TABLET    Take 100 mg by mouth daily.     Physical Exam: Filed Vitals:   05/03/13 0858  BP: 156/84  Pulse: 69  Temp: 98.1 F (36.7 C)  TempSrc: Oral  Resp: 14  Height: 5' (1.524 m)  Weight: 93 lb 9.6 oz (42.457 kg)  Physical Exam  Constitutional:  Frail AA female, NAD, seated in wheelchair for visit, does ambulate with walker, but unsteady  HENT:  Head: Normocephalic and atraumatic.   Cardiovascular: Normal rate and regular rhythm.   Murmur heard. Pulmonary/Chest: Effort normal and breath sounds normal. No respiratory distress. She has no wheezes.  Abdominal: Soft. Bowel sounds are normal. She exhibits no distension. There is no tenderness.  Musculoskeletal: Normal range of motion. She exhibits no edema and no tenderness.  Neurological: She is alert.  Oriented to person only--thinks she has been here before but it is her first visit, gait unsteady  Skin: Skin is warm and dry.    Labs reviewed: Basic Metabolic Panel:  Recent Labs  16/10/96 1444  02/28/13 1818  03/03/13 0400 03/03/13 1654 03/04/13 0450  NA 138  < >  --   < > 126* 127* 127*  K 4.4  < >  --   < > 6.6* 4.1 3.2*  CL 107  < >  --   < > 99 92* 91*  CO2 22  < >  --   < > 22 27 27   GLUCOSE 88  < >  --   < > 105* 136* 105*  BUN 20  < >  --   < > 8 6 8   CREATININE 1.0  < >  --   < > 0.70 0.69 0.81  CALCIUM 9.0  < >  --   < > 8.5 9.3 8.8  TSH 1.04  --  0.520  --   --   --   --   < > = values in this interval not displayed. Liver Function Tests:  Recent Labs  11/30/12 1444 02/28/13 1818  AST 19 44*  ALT 8 19  ALKPHOS 74 17*  BILITOT 0.7 0.3  PROT 7.5 7.9  ALBUMIN 3.6 3.0*  CBC:  Recent Labs  11/30/12 1444 02/28/13 1535 03/01/13 0415 03/04/13 0450  WBC 5.8 7.8 7.2 8.1  NEUTROABS 3.7 5.0  --   --   HGB 10.3* 12.3 10.5* 11.3*  HCT 30.2* 33.9* 29.8* 32.6*  MCV 87.8 81.5 82.8 85.3  PLT 245.0 326 298 335   Lab Results  Component Value Date   HGBA1C 5.3 10/01/2011   Past Procedures: CT brain 10/01/11:  1. Frontal and temporal lobe regional cerebral volume loss raises  the possibility of underlying frontotemporal lobar degeneration.  2. Mild to moderate ventriculomegaly, may be related to #1, but in  the appropriate clinical setting normal pressure hydrocephalus  could not be excluded.  Assessment/Plan 1. Anxiety and depression -has mood swings, did not tolerate zoloft and  benzodiazepines previously due to hallucinations of her husband, paranoia -not currently on any psychiatric meds  2. ATHEROSLEROSIS NATV ART EXTREM W/INTERMITTENT CLAUDICATION -I do not see ABIs on record--will see if they were done externally--if so, obtain results and if not, will obtain to assess lower extremity pain  3. Hyponatremia with generalized weakness, anorexia - Basic metabolic panel to reassess sodium levels--not done in f/u s/p hospitalization  4. Hypokalemia -f/u due to started on lasix at hospital discharge but no K supplement;  Is on losartan - Basic metabolic panel  5. Peripheral neuropathy -said ot have--will do neurologic exam and foot exam to assess this at physical -continue gabapentin  6. Severe protein-calorie malnutrition --due to poor po intake and dementia with failure to thrive --encourage supplements, high protein foods  7. WEIGHT LOSS --has had workup previously with spep, upep, tsh w/o any concerning findings--seems this is due to dementia with failure to thrive and depression  8. Dementia, without behavioral disturbance --may be frontotemporal--daughter does endorse some loss of inhibitions, has paranoia and hallucinations --will check mmse next time and add dementia meds if family desires--namenda may help with psychosis  9.  Insomnia--try melatonin 1mg  and increase to 3mg  if needed for sleep--take 2 hrs before bed.  10.  Hypertension:  Slightly elevated, but did not have meds this am b/c she did not eat well;  Cont losartan, lasix, amlodipine --cont asa 81 mg --f/u renal function with use of aleve, losartan, lasix  Labs/tests ordered:  bmp Next appt: 1 month for mmse, EV

## 2013-05-03 NOTE — Patient Instructions (Addendum)
We'll call if your labs are abnormal Next time, I will check your memory and do a complete exam. Take melatonin 1 up to 3 mg 2 hours before bed to help with sleep.

## 2013-05-04 LAB — BASIC METABOLIC PANEL
BUN/Creatinine Ratio: 14 (ref 11–26)
BUN: 12 mg/dL (ref 10–36)
CO2: 24 mmol/L (ref 18–29)
Calcium: 9.3 mg/dL (ref 8.6–10.2)
Chloride: 100 mmol/L (ref 97–108)
Creatinine, Ser: 0.88 mg/dL (ref 0.57–1.00)
GFR calc Af Amer: 66 mL/min/{1.73_m2} (ref >59)
GFR calc non Af Amer: 57 mL/min/{1.73_m2} — ABNORMAL LOW (ref >59)
Glucose: 83 mg/dL (ref 65–99)
Potassium: 3.7 mmol/L (ref 3.5–5.2)
Sodium: 137 mmol/L (ref 134–144)

## 2013-06-03 ENCOUNTER — Ambulatory Visit (INDEPENDENT_AMBULATORY_CARE_PROVIDER_SITE_OTHER): Payer: Medicare Other | Admitting: Internal Medicine

## 2013-06-03 ENCOUNTER — Ambulatory Visit: Payer: Medicare Other | Admitting: Internal Medicine

## 2013-06-03 ENCOUNTER — Encounter: Payer: Self-pay | Admitting: Internal Medicine

## 2013-06-03 VITALS — BP 122/60 | HR 88 | Temp 98.8°F | Wt 91.1 lb

## 2013-06-03 DIAGNOSIS — M25561 Pain in right knee: Secondary | ICD-10-CM | POA: Insufficient documentation

## 2013-06-03 DIAGNOSIS — M79609 Pain in unspecified limb: Secondary | ICD-10-CM

## 2013-06-03 DIAGNOSIS — M25569 Pain in unspecified knee: Secondary | ICD-10-CM

## 2013-06-03 DIAGNOSIS — G4700 Insomnia, unspecified: Secondary | ICD-10-CM

## 2013-06-03 DIAGNOSIS — I10 Essential (primary) hypertension: Secondary | ICD-10-CM

## 2013-06-03 DIAGNOSIS — M79604 Pain in right leg: Secondary | ICD-10-CM

## 2013-06-03 MED ORDER — ZOLPIDEM TARTRATE 5 MG PO TABS: 5.0000 mg | ORAL_TABLET | Freq: Every evening | ORAL | Status: DC | PRN

## 2013-06-03 NOTE — Assessment & Plan Note (Signed)
Ok for ambien prn,.  to f/u any worsening symptoms or concerns  

## 2013-06-03 NOTE — Assessment & Plan Note (Signed)
stable overall by history and exam, recent data reviewed with pt, and pt to continue medical treatment as before,  to f/u any worsening symptoms or concerns BP Readings from Last 3 Encounters:  06/03/13 122/60  05/03/13 156/84  03/04/13 131/45

## 2013-06-03 NOTE — Progress Notes (Signed)
Subjective:    Patient ID: Elizabeth Peterson, female    DOB: June 23, 1919, 79 y.o.   MRN: [id number]  HPI  Here to f/u with sister (daughter out of town now), with c/o bilat knee pain left > right with intermittent swelling, none now, mild, for several years, ? Worse despite current meds including nsaid.  Last seen ortho with cortisone years ago.  No recent falls or giveaways.  Does also have pain to the bilat calf area that also seems to ache at night, may be worse with exertion, better with rest. Pt denies chest pain, increased sob or doe, wheezing, orthopnea, PND, increased LE swelling, palpitations, dizziness or syncope. Past Medical History  Diagnosis Date  . HYPERLIPIDEMIA 04/16/2007  . ANEMIA-NOS 04/16/2007  . ANXIETY 04/16/2007  . DEPRESSION 09/03/2007  . HYPERTENSION 04/16/2007  . ATHEROSLERO NATV ART EXTREM W/INTERMIT CLAUDICAT 06/05/2010  . ALLERGIC RHINITIS 02/27/2009  . PNEUMONIA, COMMUNITY ACQUIRED, PNEUMOCOCCAL 12/07/2008  . ASTHMA 09/03/2007  . GERD 04/16/2007  . DIVERTICULOSIS, COLON 09/03/2007  . MENOPAUSAL DISORDER 04/18/2008  . OSTEOARTHRITIS, KNEE, LEFT 11/24/2009  . JOINT EFFUSION, LEFT KNEE 02/27/2009  . KNEE PAIN, BILATERAL 06/01/2010  . LOW BACK PAIN 04/16/2007  . LEG PAIN, LEFT 06/01/2010  . WEIGHT LOSS 07/25/2008  . RASH-NONVESICULAR 07/25/2008  . Abdominal pain, generalized 09/03/2007  . BREAST CANCER, HX OF 04/16/2007  . COLONIC POLYPS, HX OF 09/03/2007  . Dementia 06/17/2012    mild  . Peripheral neuropathy 06/17/2012   Past Surgical History  Procedure Laterality Date  . Abdominal hysterectomy  1966  . Tubal ligation    . S/p left mastectomy  2011    reports that she has never smoked. She has never used smokeless tobacco. She reports that she does not drink alcohol or use illicit drugs. family history includes Cancer in her sister; Diabetes in her other; Hypertension in her maternal grandmother and other. Allergies  Allergen Reactions  . Amlodipine Besylate     REACTION:  rash and itch  . Benzodiazepines Other (See Comments)    Hx of overuse/memory issue  . Penicillins   . Sulfonamide Derivatives    Current Outpatient Prescriptions on File Prior to Visit  Medication Sig Dispense Refill  . amLODipine (NORVASC) 10 MG tablet TAKE 1 TABLET BY MOUTH EVERY DAY  30 tablet  11  . aspirin 81 MG EC tablet Take 81 mg by mouth daily.        . furosemide (LASIX) 20 MG tablet Take 1 tablet (20 mg total) by mouth daily.  30 tablet  3  . gabapentin (NEURONTIN) 100 MG capsule Take 1 capsule (100 mg total) by mouth 3 (three) times daily.  90 capsule  5  . KLOR-CON M20 20 MEQ tablet Take 20 mEq by mouth daily.       Marland Kitchen losartan (COZAAR) 100 MG tablet Take 1 tablet (100 mg total) by mouth daily.  90 tablet  3  . nabumetone (RELAFEN) 500 MG tablet Take 500 mg by mouth 2 (two) times daily.      . polyethylene glycol (MIRALAX / GLYCOLAX) packet Take 17 g by mouth daily as needed (for constipation).       No current facility-administered medications on file prior to visit.   Review of Systems  Constitutional: Negative for unexpected weight change, or unusual diaphoresis  HENT: Negative for tinnitus.   Eyes: Negative for photophobia and visual disturbance.  Respiratory: Negative for choking and stridor.   Gastrointestinal: Negative for vomiting and blood in stool.  Genitourinary: Negative for hematuria and decreased urine volume.  Musculoskeletal: Negative for acute joint swelling Skin: Negative for color change and wound.  Neurological: Negative for tremors and numbness other than noted  Psychiatric/Behavioral: Negative for decreased concentration or  hyperactivity.        Objective:   Physical Exam BP 122/60  Pulse 88  Temp(Src) 98.8 F (37.1 C) (Oral)  Wt 91 lb 2 oz (41.334 kg)  BMI 17.8 kg/m2  SpO2 99% VS noted,  Constitutional: Pt appears well-developed and well-nourished.  HENT: Head: NCAT.  Right Ear: External ear normal.  Left Ear: External ear normal.   Eyes: Conjunctivae and EOM are normal. Pupils are equal, round, and reactive to light.  Neck: Normal range of motion. Neck supple.  Cardiovascular: Normal rate and regular rhythm.   Pulmonary/Chest: Effort normal and breath sounds normal.  Abd:  Soft, NT, non-distended, + BS Neurological: Pt is alert. At baseline confusion Skin: Skin is warm. No erythema.  Psychiatric: Pt behavior is normal.     Assessment & Plan:

## 2013-06-03 NOTE — Patient Instructions (Signed)
Please try Tylenol 8 hr (acetaminophen 650 mg) up to three times per day for the knee pain Please call next Monday if you would like to see Dr Katrinka Blazing in our office regarding your knee pain (he does cortisone shots) Please take all new medication as prescribed - the ambien as needed for sleep Please continue all other medications as before, and refills have been done if requested. Please have the pharmacy call with any other refills you may need.  You will be contacted regarding the referral for: leg arterial dopplers (to check the circulation)

## 2013-06-03 NOTE — Assessment & Plan Note (Signed)
?   Claudication vs msk - for LE art dopplers

## 2013-06-03 NOTE — Assessment & Plan Note (Signed)
With signficant bony change c/w DJD, no effusions or o/w unstable, ok for tylenol 8 hr tid, but if not improved would likely need to see Dr Katrinka Blazing in our office /[facility]

## 2013-06-07 ENCOUNTER — Encounter: Payer: Self-pay | Admitting: Internal Medicine

## 2013-06-07 ENCOUNTER — Ambulatory Visit (INDEPENDENT_AMBULATORY_CARE_PROVIDER_SITE_OTHER): Payer: Medicare Other | Admitting: Internal Medicine

## 2013-06-07 VITALS — BP 150/76 | HR 72 | Temp 97.4°F | Resp 16 | Ht 60.0 in | Wt 95.0 lb

## 2013-06-07 DIAGNOSIS — F341 Dysthymic disorder: Secondary | ICD-10-CM

## 2013-06-07 DIAGNOSIS — G4700 Insomnia, unspecified: Secondary | ICD-10-CM

## 2013-06-07 DIAGNOSIS — G629 Polyneuropathy, unspecified: Secondary | ICD-10-CM

## 2013-06-07 DIAGNOSIS — E43 Unspecified severe protein-calorie malnutrition: Secondary | ICD-10-CM

## 2013-06-07 DIAGNOSIS — G609 Hereditary and idiopathic neuropathy, unspecified: Secondary | ICD-10-CM

## 2013-06-07 DIAGNOSIS — F329 Major depressive disorder, single episode, unspecified: Secondary | ICD-10-CM

## 2013-06-07 DIAGNOSIS — F0280 Dementia in other diseases classified elsewhere without behavioral disturbance: Secondary | ICD-10-CM

## 2013-06-07 MED ORDER — MIRTAZAPINE 7.5 MG PO TABS: 7.5000 mg | ORAL_TABLET | Freq: Every day | ORAL | Status: DC

## 2013-06-07 MED ORDER — MEMANTINE HCL 5 MG PO TABS
5.0000 mg | ORAL_TABLET | Freq: Two times a day (BID) | ORAL | Status: DC
Start: 2013-06-07 — End: 2013-07-04

## 2013-06-07 NOTE — Patient Instructions (Signed)
Let me know how you tolerate the namenda 5mg  twice a day.  If this goes ok, will increase to 10mg  twice a day--this should help to keep you as independent as possible.    I would not recommend taking ambien for sleep due to its side effects in older people.

## 2013-06-07 NOTE — Progress Notes (Signed)
Patient ID: Elizabeth Peterson, female   DOB: May 14, 1919, 79 y.o.   MRN: [id number] Location:  [facility]  Code Status: full code--going to complete paperwork  Allergies  Allergen Reactions  . Amlodipine Besylate     REACTION: rash and itch  . Benzodiazepines Other (See Comments)    Hx of overuse/memory issue  . Penicillins   . Sulfonamide Derivatives     Chief Complaint  Patient presents with  . Follow-up    HPI: Patient is a 79 y.o. black female seen in the office today for management of chronic illnesses.  Sleeping has not improved since last visit- Daughter states that Dr. Melvyn Novas prescribed Ambien but she has not started taking it yet. Neuropathy pain is the same- can't see any improvement with gabapentin. Back pain has improved.  Daughter reports patient has not taken her BP meds this am and gets anxious when coming to the doctors office. Pt. States that her mood has improved.  GERD- improved  Review of Systems:  Review of Systems  Constitutional: Negative for chills.       Has gained 4lbs since last visit  Eyes: Negative for blurred vision and double vision.  Cardiovascular: Positive for chest pain.       States she has chest pain "every now and then" usually occurs at night or early morning and resolves on its own. Denies diaphoresis during these episodes  Gastrointestinal: Positive for constipation.       Has BM every 2-3 days  Genitourinary: Negative for dysuria.  Neurological: Negative for dizziness.  Psychiatric/Behavioral: Positive for memory loss. Negative for depression. The patient is not nervous/anxious.        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 HYPERLIPIDEMIA 04/16/2007  . ANEMIA-NOS 04/16/2007  . ANXIETY 04/16/2007  . DEPRESSION 09/03/2007  . HYPERTENSION 04/16/2007  . ATHEROSLERO NATV ART EXTREM W/INTERMIT CLAUDICAT 06/05/2010  . ALLERGIC RHINITIS 02/27/2009   . PNEUMONIA, COMMUNITY ACQUIRED, PNEUMOCOCCAL 12/07/2008  . ASTHMA 09/03/2007  . GERD 04/16/2007  . DIVERTICULOSIS, COLON 09/03/2007  . MENOPAUSAL DISORDER 04/18/2008  . OSTEOARTHRITIS, KNEE, LEFT 11/24/2009  . JOINT EFFUSION, LEFT KNEE 02/27/2009  . KNEE PAIN, BILATERAL 06/01/2010  . LOW BACK PAIN 04/16/2007  . LEG PAIN, LEFT 06/01/2010  . WEIGHT LOSS 07/25/2008  . RASH-NONVESICULAR 07/25/2008  . Abdominal pain, generalized 09/03/2007  . BREAST CANCER, HX OF 04/16/2007  . COLONIC POLYPS, HX OF 09/03/2007  . Dementia 06/17/2012    mild  . Peripheral neuropathy 06/17/2012    Past Surgical History  Procedure Laterality Date  . Abdominal hysterectomy  1966  . Tubal ligation    . S/p left mastectomy  2011    Social History:   reports that she has never smoked. She has never used smokeless tobacco. She reports that she does not drink alcohol or use illicit drugs.  Family History  Problem Relation Age of Onset  . Cancer Sister     Breast  . Hypertension Other   . Diabetes Other     1st degree relative  . Hypertension Maternal Grandmother     Medications: Patient's Medications  New Prescriptions   No medications on file  Previous Medications   AMLODIPINE (NORVASC) 10 MG TABLET    TAKE 1 TABLET BY MOUTH EVERY DAY   ASPIRIN 81 MG EC TABLET    Take 81 mg by mouth daily.  FUROSEMIDE (LASIX) 20 MG TABLET    Take 1 tablet (20 mg total) by mouth daily.   GABAPENTIN (NEURONTIN) 100 MG CAPSULE    Take 1 capsule (100 mg total) by mouth 3 (three) times daily.   KLOR-CON M20 20 MEQ TABLET    Take 20 mEq by mouth daily.    LOSARTAN (COZAAR) 100 MG TABLET    Take 1 tablet (100 mg total) by mouth daily.   NABUMETONE (RELAFEN) 500 MG TABLET    Take 500 mg by mouth 2 (two) times daily.   POLYETHYLENE GLYCOL (MIRALAX / GLYCOLAX) PACKET    Take 17 g by mouth daily as needed (for constipation).   ZOLPIDEM (AMBIEN) 5 MG TABLET    Take 1 tablet (5 mg total) by mouth at bedtime as needed for sleep.   Modified Medications   No medications on file  Discontinued Medications   No medications on file     Physical Exam: Filed Vitals:   06/07/13 1350  BP: 150/76  Pulse: 72  Temp: 97.4 F (36.3 C)  TempSrc: Oral  Resp: 16  Weight: 95 lb (43.092 kg)  SpO2: 96%   Physical Exam  Constitutional: She appears well-developed and well-nourished.  Cardiovascular: Normal rate, regular rhythm, normal heart sounds and intact distal pulses.   Pulmonary/Chest: Effort normal and breath sounds normal.  Abdominal: Soft. Bowel sounds are normal.  Neurological: She is alert.  Oriented to person  Disoriented to place and time  Skin: Skin is warm and dry.  Psychiatric: She has a normal mood and affect.   Labs reviewed: Basic Metabolic Panel:  Recent Labs  13/08/65 1444  02/28/13 1818  03/03/13 1654 03/04/13 0450 05/03/13 1002  NA 138  < >  --   < > 127* 127* 137  K 4.4  < >  --   < > 4.1 3.2* 3.7  CL 107  < >  --   < > 92* 91* 100  CO2 22  < >  --   < > 27 27 24   GLUCOSE 88  < >  --   < > 136* 105* 83  BUN 20  < >  --   < > 6 8 12   CREATININE 1.0  < >  --   < > 0.69 0.81 0.88  CALCIUM 9.0  < >  --   < > 9.3 8.8 9.3  TSH 1.04  --  0.520  --   --   --   --   < > = values in this interval not displayed. Liver Function Tests:  Recent Labs  11/30/12 1444 02/28/13 1818  AST 19 44*  ALT 8 19  ALKPHOS 74 17*  BILITOT 0.7 0.3  PROT 7.5 7.9  ALBUMIN 3.6 3.0*   CBC:  Recent Labs  11/30/12 1444 02/28/13 1535 03/01/13 0415 03/04/13 0450  WBC 5.8 7.8 7.2 8.1  NEUTROABS 3.7 5.0  --   --   HGB 10.3* 12.3 10.5* 11.3*  HCT 30.2* 33.9* 29.8* 32.6*  MCV 87.8 81.5 82.8 85.3  PLT 245.0 326 298 335   Lab Results  Component Value Date   HGBA1C 5.3 10/01/2011   MMSE was 15/30.  Failed clock.  Could not even make face of it.    Assessment/Plan 1. Anxiety and depression - with insomnia, poor po intake - begin mirtazapine (REMERON) 7.5 MG tablet; Take 1 tablet (7.5 mg total) by  mouth at bedtime.  Dispense: 30 tablet; Refill: 3  2. Peripheral  neuropathy Stable on current therapy, no changes  3. Severe protein-calorie malnutrition Will try remeron to encourage appetite  4. Alzheimer's disease -moderate--scored 15/30 and failed clock - begin memantine (NAMENDA) 5 MG tablet; Take 1 tablet (5 mg total) by mouth 2 (two) times daily.  Dispense: 60 tablet; Refill: 0 -will increase to 10mg  bid if she tolerates if--daughter is to let me know in a few weeks how it is going  5. Insomnia - not sleeping well at all - begin mirtazapine (REMERON) 7.5 MG tablet; Take 1 tablet (7.5 mg total) by mouth at bedtime.  Dispense: 30 tablet; Refill: 3 -would avoid benzos and ambien if possible  Labs/tests ordered: MMSE done today Next appt: 3 mos

## 2013-06-08 ENCOUNTER — Encounter (INDEPENDENT_AMBULATORY_CARE_PROVIDER_SITE_OTHER): Payer: Medicare Other

## 2013-06-08 DIAGNOSIS — I70219 Atherosclerosis of native arteries of extremities with intermittent claudication, unspecified extremity: Secondary | ICD-10-CM

## 2013-06-08 DIAGNOSIS — M79604 Pain in right leg: Secondary | ICD-10-CM

## 2013-07-04 ENCOUNTER — Other Ambulatory Visit: Payer: Self-pay | Admitting: Internal Medicine

## 2013-07-08 ENCOUNTER — Ambulatory Visit: Payer: Medicare Other | Admitting: Internal Medicine

## 2013-07-09 ENCOUNTER — Telehealth: Payer: Self-pay | Admitting: *Deleted

## 2013-07-09 NOTE — Telephone Encounter (Signed)
Pam called and left message on voicemail that her mothers sleeping medication was not working and wanted to know what to do. Tried to call patient and LMOM to return call.

## 2013-07-13 ENCOUNTER — Other Ambulatory Visit: Payer: Self-pay | Admitting: *Deleted

## 2013-07-21 ENCOUNTER — Ambulatory Visit: Payer: Self-pay | Admitting: Nurse Practitioner

## 2013-08-29 ENCOUNTER — Other Ambulatory Visit: Payer: Self-pay | Admitting: Internal Medicine

## 2013-09-02 ENCOUNTER — Ambulatory Visit: Payer: Medicare Other | Admitting: Internal Medicine

## 2013-09-06 ENCOUNTER — Ambulatory Visit (INDEPENDENT_AMBULATORY_CARE_PROVIDER_SITE_OTHER): Payer: Medicare Other | Admitting: Internal Medicine

## 2013-09-06 ENCOUNTER — Encounter: Payer: Self-pay | Admitting: Internal Medicine

## 2013-09-06 VITALS — BP 154/72 | HR 73 | Temp 99.5°F | Wt 108.0 lb

## 2013-09-06 DIAGNOSIS — G4700 Insomnia, unspecified: Secondary | ICD-10-CM

## 2013-09-06 DIAGNOSIS — M1710 Unilateral primary osteoarthritis, unspecified knee: Secondary | ICD-10-CM

## 2013-09-06 DIAGNOSIS — F0280 Dementia in other diseases classified elsewhere without behavioral disturbance: Secondary | ICD-10-CM

## 2013-09-06 DIAGNOSIS — R634 Abnormal weight loss: Secondary | ICD-10-CM

## 2013-09-06 DIAGNOSIS — R059 Cough, unspecified: Secondary | ICD-10-CM

## 2013-09-06 DIAGNOSIS — E785 Hyperlipidemia, unspecified: Secondary | ICD-10-CM

## 2013-09-06 DIAGNOSIS — G629 Polyneuropathy, unspecified: Secondary | ICD-10-CM

## 2013-09-06 DIAGNOSIS — M170 Bilateral primary osteoarthritis of knee: Secondary | ICD-10-CM

## 2013-09-06 DIAGNOSIS — I10 Essential (primary) hypertension: Secondary | ICD-10-CM

## 2013-09-06 DIAGNOSIS — G609 Hereditary and idiopathic neuropathy, unspecified: Secondary | ICD-10-CM

## 2013-09-06 DIAGNOSIS — R05 Cough: Secondary | ICD-10-CM

## 2013-09-06 MED ORDER — BLOOD PRESSURE CUFF MISC: Status: DC

## 2013-09-06 MED ORDER — HYDRALAZINE HCL 25 MG PO TABS: 25.0000 mg | ORAL_TABLET | Freq: Three times a day (TID) | ORAL | Status: DC

## 2013-09-06 MED ORDER — MIRTAZAPINE 15 MG PO TABS: 15.0000 mg | ORAL_TABLET | Freq: Every day | ORAL | Status: DC

## 2013-09-06 NOTE — Patient Instructions (Addendum)
1.  Try extra strength tylenol 500mg  up to 3000mg  (6 pills) per day for knee and back pain.  Avoid motrin or aleve which can in time affect kidneys and cause stomach bleeding over time.  2.  Increase dose of remeron from 7.5mg  to 15mg  at bedtime for sleep.  3.  Stop losartan 100mg .  4.  Begin hydralazine 25mg  by mouth three times per day.

## 2013-09-06 NOTE — Progress Notes (Signed)
Patient ID: Elizabeth Peterson, female   DOB: 1919-08-01, 79 y.o.   MRN: [id number]   Location:  [facility]   Allergies  Allergen Reactions  . Amlodipine Besylate     REACTION: rash and itch  . Benzodiazepines Other (See Comments)    Hx of overuse/memory issue  . Penicillins   . Sulfonamide Derivatives     Chief Complaint  Patient presents with  . Medical Managment of Chronic Issues    3 month follow-up, here with grand - daughter Cambria Osten)   . Medication Management    Sleep medication not helping (Remeron)   . Pain    patient with ongoing pain in legs and back- ? RX for Ibuprofen   . Cough    Patient with cough x several months - ? related to any current medications     HPI: Patient is a 79 y.o. female seen in the office today for medical mgt of chronic diseases.  She is accompanied by her granddaughter today.  Not sleeping well at all so her granddaughter is not either.  Is up.  Has been up trying to cook w/o any food in the pot.    Excruciating pain in legs and back.  Makes her weak and hard to walk.  Hurts off and on.  Aching pain in back of legs.    Is coughing.  Does take losartan for bp since she was d/c from hospital in June and has been coughing for that long.    Her daughter wondered if she can take motrin 800mg  and one of them helped her.  Review of Systems:  Review of Systems  Constitutional: Positive for weight loss. Negative for fever and chills.  HENT: Negative for congestion.   Respiratory: Positive for cough. Negative for shortness of breath.   Cardiovascular: Negative for chest pain.  Gastrointestinal: Negative for abdominal pain, constipation, blood in stool and melena.  Genitourinary: Negative for dysuria.  Musculoskeletal: Positive for back pain and joint pain. Negative for falls.  Skin: Negative for rash.  Neurological: Negative for dizziness.  Psychiatric/Behavioral: Positive for depression and memory  loss. The patient has insomnia.     Past Medical History  Diagnosis Date  . HYPERLIPIDEMIA 04/16/2007  . ANEMIA-NOS 04/16/2007  . ANXIETY 04/16/2007  . DEPRESSION 09/03/2007  . HYPERTENSION 04/16/2007  . ATHEROSLERO NATV ART EXTREM W/INTERMIT CLAUDICAT 06/05/2010  . ALLERGIC RHINITIS 02/27/2009  . PNEUMONIA, COMMUNITY ACQUIRED, PNEUMOCOCCAL 12/07/2008  . ASTHMA 09/03/2007  . GERD 04/16/2007  . DIVERTICULOSIS, COLON 09/03/2007  . MENOPAUSAL DISORDER 04/18/2008  . OSTEOARTHRITIS, KNEE, LEFT 11/24/2009  . JOINT EFFUSION, LEFT KNEE 02/27/2009  . KNEE PAIN, BILATERAL 06/01/2010  . LOW BACK PAIN 04/16/2007  . LEG PAIN, LEFT 06/01/2010  . WEIGHT LOSS 07/25/2008  . RASH-NONVESICULAR 07/25/2008  . Abdominal pain, generalized 09/03/2007  . BREAST CANCER, HX OF 04/16/2007  . COLONIC POLYPS, HX OF 09/03/2007  . Dementia 06/17/2012    mild  . Peripheral neuropathy 06/17/2012    Past Surgical History  Procedure Laterality Date  . Abdominal hysterectomy  1966  . Tubal ligation    . S/p left mastectomy  2011    Social History:   reports that she has never smoked. She has never used smokeless tobacco. She reports that she does not drink alcohol or use illicit drugs.  Family History  Problem Relation Age of Onset  . Cancer Sister     Breast  . Hypertension Other   .  Diabetes Other     1st degree relative  . Hypertension Maternal Grandmother     Medications: Patient's Medications  New Prescriptions   No medications on file  Previous Medications   AMLODIPINE (NORVASC) 10 MG TABLET    TAKE 1 TABLET BY MOUTH EVERY DAY   ASPIRIN 81 MG EC TABLET    Take 81 mg by mouth daily.     GABAPENTIN (NEURONTIN) 100 MG CAPSULE    Take 1 capsule (100 mg total) by mouth 3 (three) times daily.   LOSARTAN (COZAAR) 100 MG TABLET    Take 1 tablet (100 mg total) by mouth daily.   MIRTAZAPINE (REMERON) 7.5 MG TABLET    Take 1 tablet (7.5 mg total) by mouth at bedtime.   NAMENDA 5 MG TABLET    TAKE 1 TABLET BY MOUTH TWICE  A DAY   NAPROXEN SODIUM (ALEVE) 220 MG CAPS    Take by mouth as needed.   POLYETHYLENE GLYCOL (MIRALAX / GLYCOLAX) PACKET    Take 17 g by mouth daily as needed (for constipation).  Modified Medications   No medications on file  Discontinued Medications   FUROSEMIDE (LASIX) 20 MG TABLET    Take 1 tablet (20 mg total) by mouth daily.   KLOR-CON M20 20 MEQ TABLET    Take 20 mEq by mouth daily.    NABUMETONE (RELAFEN) 500 MG TABLET    Take 500 mg by mouth 2 (two) times daily.     Physical Exam: Filed Vitals:   09/06/13 1412  BP: 154/72  Pulse: 73  Temp: 99.5 F (37.5 C)  TempSrc: Oral  Weight: 108 lb (48.988 kg)  SpO2: 99%  Physical Exam  Constitutional:  Frail black female, nad  HENT:  Head: Normocephalic and atraumatic.  Cardiovascular: Normal rate, regular rhythm, normal heart sounds and intact distal pulses.   Pulmonary/Chest: Effort normal and breath sounds normal. No respiratory distress.  Abdominal: Soft. Bowel sounds are normal. She exhibits no distension. There is no tenderness.  Musculoskeletal: Normal range of motion. She exhibits no tenderness.  Crepitus of knees  Neurological: She is alert.  Skin: Skin is warm and dry.     Labs reviewed: Basic Metabolic Panel:  Recent Labs  16/10/96 1444  02/28/13 1818  03/03/13 1654 03/04/13 0450 05/03/13 1002  NA 138  < >  --   < > 127* 127* 137  K 4.4  < >  --   < > 4.1 3.2* 3.7  CL 107  < >  --   < > 92* 91* 100  CO2 22  < >  --   < > 27 27 24   GLUCOSE 88  < >  --   < > 136* 105* 83  BUN 20  < >  --   < > 6 8 12   CREATININE 1.0  < >  --   < > 0.69 0.81 0.88  CALCIUM 9.0  < >  --   < > 9.3 8.8 9.3  TSH 1.04  --  0.520  --   --   --   --   < > = values in this interval not displayed. Liver Function Tests:  Recent Labs  11/30/12 1444 02/28/13 1818  AST 19 44*  ALT 8 19  ALKPHOS 74 17*  BILITOT 0.7 0.3  PROT 7.5 7.9  ALBUMIN 3.6 3.0*  CBC:  Recent Labs  11/30/12 1444 02/28/13 1535 03/01/13 0415  03/04/13 0450  WBC 5.8 7.8 7.2 8.1  NEUTROABS 3.7 5.0  --   --   HGB 10.3* 12.3 10.5* 11.3*  HCT 30.2* 33.9* 29.8* 32.6*  MCV 87.8 81.5 82.8 85.3  PLT 245.0 326 298 335   Lab Results  Component Value Date   HGBA1C 5.3 10/01/2011   Assessment/Plan 1. Insomnia -increase from 7.5mg  to 15mg  and continue to keep her supervised at all times - mirtazapine (REMERON) 15 MG tablet; Take 1 tablet (15 mg total) by mouth at bedtime.  Dispense: 30 tablet; Refill: 3  2. Alzheimer's disease - plan on adding namenda next visit, granddaughter is now staying with her overnight and watching her b/c she is trying to use the stove  3. Loss of weight -cont remeron for insomnia and poor appetite  4. Cough -may be due to losartan--will try d/cing and putting on hydralazine -if cough does not improve, another etiology will need to be explored, but otherwise asymptomatic  5. Essential hypertension, benign -was at goal with the losartan and amlodipine (? Why in allergies and she is taking) -try hydralazine 15mg  three times daily - f/u 6 wks with [facility] for bp check and labs:  Cbc, cmp, flp  6. Peripheral neuropathy -continue gabapentin for pain  7.  Osteoarthritis -stop using aleve and begin tylenol es 500mg  up to 3000mg  daily for pain Avoid nsaids due to renal function and risk of gi bleed  Labs/tests ordered:  Cbc, cmp, flp Next appt: 6wks with MA for bp check, 3 mos with me

## 2013-10-18 ENCOUNTER — Ambulatory Visit (INDEPENDENT_AMBULATORY_CARE_PROVIDER_SITE_OTHER): Payer: Medicare Other | Admitting: Internal Medicine

## 2013-10-18 ENCOUNTER — Other Ambulatory Visit: Payer: Medicare Other

## 2013-10-18 VITALS — BP 160/78

## 2013-10-18 DIAGNOSIS — I10 Essential (primary) hypertension: Secondary | ICD-10-CM

## 2013-10-18 DIAGNOSIS — G309 Alzheimer's disease, unspecified: Secondary | ICD-10-CM

## 2013-10-18 DIAGNOSIS — G4700 Insomnia, unspecified: Secondary | ICD-10-CM

## 2013-10-18 DIAGNOSIS — R634 Abnormal weight loss: Secondary | ICD-10-CM

## 2013-10-18 DIAGNOSIS — F0280 Dementia in other diseases classified elsewhere without behavioral disturbance: Secondary | ICD-10-CM

## 2013-10-18 DIAGNOSIS — R059 Cough, unspecified: Secondary | ICD-10-CM

## 2013-10-18 DIAGNOSIS — E785 Hyperlipidemia, unspecified: Secondary | ICD-10-CM

## 2013-10-18 DIAGNOSIS — R05 Cough: Secondary | ICD-10-CM

## 2013-10-19 LAB — COMPREHENSIVE METABOLIC PANEL
ALT: 12 IU/L (ref 0–32)
AST: 20 IU/L (ref 0–40)
Albumin/Globulin Ratio: 1.6 (ref 1.1–2.5)
Albumin: 4.5 g/dL (ref 3.2–4.6)
Alkaline Phosphatase: 81 IU/L (ref 39–117)
BUN/Creatinine Ratio: 22 (ref 11–26)
BUN: 19 mg/dL (ref 10–36)
CO2: 20 mmol/L (ref 18–29)
Calcium: 9.6 mg/dL (ref 8.7–10.3)
Chloride: 99 mmol/L (ref 97–108)
Creatinine, Ser: 0.86 mg/dL (ref 0.57–1.00)
GFR calc Af Amer: 68 mL/min/{1.73_m2} (ref >59)
GFR calc non Af Amer: 59 mL/min/{1.73_m2} — ABNORMAL LOW (ref >59)
Globulin, Total: 2.9 g/dL (ref 1.5–4.5)
Glucose: 93 mg/dL (ref 65–99)
Potassium: 5 mmol/L (ref 3.5–5.2)
Sodium: 139 mmol/L (ref 134–144)
Total Bilirubin: 0.5 mg/dL (ref 0.0–1.2)
Total Protein: 7.4 g/dL (ref 6.0–8.5)

## 2013-10-19 LAB — CBC WITH DIFFERENTIAL/PLATELET
Basophils Absolute: 0.1 10*3/uL (ref 0.0–0.2)
Basos: 1 %
Eos: 10 %
Eosinophils Absolute: 0.7 10*3/uL — ABNORMAL HIGH (ref 0.0–0.4)
HCT: 30.7 % — ABNORMAL LOW (ref 34.0–46.6)
Hemoglobin: 10.4 g/dL — ABNORMAL LOW (ref 11.1–15.9)
Immature Grans (Abs): 0 10*3/uL (ref 0.0–0.1)
Immature Granulocytes: 0 %
Lymphocytes Absolute: 2.2 10*3/uL (ref 0.7–3.1)
Lymphs: 32 %
MCH: 31.5 pg (ref 26.6–33.0)
MCHC: 33.9 g/dL (ref 31.5–35.7)
MCV: 93 fL (ref 79–97)
Monocytes Absolute: 0.7 10*3/uL (ref 0.1–0.9)
Monocytes: 11 %
Neutrophils Absolute: 3.1 10*3/uL (ref 1.4–7.0)
Neutrophils Relative %: 46 %
RBC: 3.3 x10E6/uL — ABNORMAL LOW (ref 3.77–5.28)
RDW: 14.2 % (ref 12.3–15.4)
WBC: 6.7 10*3/uL (ref 3.4–10.8)

## 2013-10-19 LAB — LIPID PANEL
Chol/HDL Ratio: 3 ratio units (ref 0.0–4.4)
Cholesterol, Total: 184 mg/dL (ref 100–199)
HDL: 62 mg/dL (ref >39)
LDL Calculated: 107 mg/dL — ABNORMAL HIGH (ref 0–99)
Triglycerides: 75 mg/dL (ref 0–149)
VLDL Cholesterol Cal: 15 mg/dL (ref 5–40)

## 2013-10-19 MED ORDER — HYDRALAZINE HCL 25 MG PO TABS
25.0000 mg | ORAL_TABLET | Freq: Four times a day (QID) | ORAL | Status: DC
Start: 2013-10-19 — End: 2013-12-06

## 2013-10-19 NOTE — Progress Notes (Signed)
Patient ID: Elizabeth Peterson, female   DOB: 1919/01/08, 80 y.o.   MRN: [id number]    Chief Complaint  Patient presents with  . Blood Pressure Check   Allergies  Allergen Reactions  . Amlodipine Besylate     REACTION: rash and itch  . Benzodiazepines Other (See Comments)    Hx of overuse/memory issue  . Penicillins   . Sulfonamide Derivatives    HPI 80 y/o patient here for bp check.   Assessment/plan  HTN- sbp elevated. Will need to increase her hydralazine to 25 mg four times a day, check bp at home. Bring bp readings to f/u visit in 2 weeks with dr reed.

## 2013-10-19 NOTE — Progress Notes (Signed)
lft message to rtn call on daughters phone number.

## 2013-11-29 ENCOUNTER — Encounter: Payer: Self-pay | Admitting: Internal Medicine

## 2013-11-29 ENCOUNTER — Telehealth: Payer: Self-pay | Admitting: *Deleted

## 2013-11-29 NOTE — Telephone Encounter (Signed)
Patient daughter, Elvin So, called and stated that she needs a letter for the [employer]. Stating that she is responsible for the care of the patient and that she is the caretaker and handles all of her financial affairs as well as healthcare. And it needs to state that she has Dementia/Alzheimer's. Daughter lives in [place].  Please call when ready # [phone]

## 2013-11-30 NOTE — Telephone Encounter (Signed)
Patient daughter Notified it was written and mailed.

## 2013-12-06 ENCOUNTER — Ambulatory Visit (INDEPENDENT_AMBULATORY_CARE_PROVIDER_SITE_OTHER): Payer: Medicare Other | Admitting: Internal Medicine

## 2013-12-06 ENCOUNTER — Encounter: Payer: Self-pay | Admitting: Internal Medicine

## 2013-12-06 VITALS — BP 160/76 | HR 64 | Temp 98.6°F | Resp 16 | Wt 111.2 lb

## 2013-12-06 DIAGNOSIS — R634 Abnormal weight loss: Secondary | ICD-10-CM

## 2013-12-06 DIAGNOSIS — I10 Essential (primary) hypertension: Secondary | ICD-10-CM

## 2013-12-06 DIAGNOSIS — G4700 Insomnia, unspecified: Secondary | ICD-10-CM

## 2013-12-06 DIAGNOSIS — IMO0002 Reserved for concepts with insufficient information to code with codable children: Secondary | ICD-10-CM

## 2013-12-06 DIAGNOSIS — G309 Alzheimer's disease, unspecified: Secondary | ICD-10-CM

## 2013-12-06 DIAGNOSIS — M1710 Unilateral primary osteoarthritis, unspecified knee: Secondary | ICD-10-CM

## 2013-12-06 DIAGNOSIS — M170 Bilateral primary osteoarthritis of knee: Secondary | ICD-10-CM

## 2013-12-06 DIAGNOSIS — F0280 Dementia in other diseases classified elsewhere without behavioral disturbance: Secondary | ICD-10-CM

## 2013-12-06 MED ORDER — MEMANTINE HCL ER 28 MG PO CP24: 28.0000 mg | ORAL_CAPSULE | Freq: Every day | ORAL | Status: DC

## 2013-12-06 MED ORDER — MEMANTINE HCL ER 7 & 14 & 21 &28 MG PO CP24: ORAL_CAPSULE | ORAL | Status: DC

## 2013-12-06 MED ORDER — HYDRALAZINE HCL 50 MG PO TABS: 50.0000 mg | ORAL_TABLET | Freq: Four times a day (QID) | ORAL | Status: DC

## 2013-12-06 MED ORDER — DICLOFENAC SODIUM 1 % TD GEL
2.0000 g | Freq: Four times a day (QID) | TRANSDERMAL | Status: DC
Start: 2013-12-06 — End: 2015-01-19

## 2013-12-06 NOTE — Progress Notes (Signed)
Patient ID: Elizabeth Peterson, female   DOB: 02-14-1919, 80 y.o.   MRN: [id number]   Location:  [facility]   Allergies  Allergen Reactions  . Amlodipine Besylate     REACTION: rash and itch  . Benzodiazepines Other (See Comments)    Hx of overuse/memory issue  . Penicillins   . Sulfonamide Derivatives     Chief Complaint  Patient presents with  . Medical Managment of Chronic Issues    3 month f/u no refills needed today  . Immunizations    Tdap to be printed.  Marland Kitchen other    still having trouble sleepint at night, leg pain on-going, wants somthing for her nerves    HPI: Patient is a 80 y.o. female seen in the office today for medical mgt of chronic diseases.  She is having difficulty sleeping, leg pain at night and anxiety.    Leg pain in calves comes and goes day and night.  Can't hardly walk due to pain.  Sometimes at home uses cane.  Using bedside commode b/c leg pain too bad to walk to bathroom.  Cannot really describe the pain.    Right now, only taking tylenol for arthritis--family says need to buy stock in tylenol.  Discussed voltaren gel.    Is taking remeron for sleep and has gained weight with it.  Has not helped with the sleep part.  Appetite remains of and on.  Says she is eating better.  Mood seems a little better.    Review of Systems:  Review of Systems  Constitutional: Negative for fever, chills and weight loss.       Gained weight  HENT: Negative for congestion.   Eyes: Negative for blurred vision.  Respiratory: Negative for shortness of breath.   Cardiovascular: Negative for chest pain and leg swelling.  Gastrointestinal: Negative for abdominal pain, blood in stool and melena.  Genitourinary: Negative for dysuria.  Musculoskeletal: Positive for back pain and joint pain. Negative for falls and myalgias.  Skin: Negative for rash.  Neurological: Positive for weakness. Negative for dizziness, sensory change and headaches.    Endo/Heme/Allergies: Does not bruise/bleed easily.  Psychiatric/Behavioral: Positive for memory loss.    Past Medical History  Diagnosis Date  . HYPERLIPIDEMIA 04/16/2007  . ANEMIA-NOS 04/16/2007  . ANXIETY 04/16/2007  . DEPRESSION 09/03/2007  . HYPERTENSION 04/16/2007  . ATHEROSLERO NATV ART EXTREM W/INTERMIT CLAUDICAT 06/05/2010  . ALLERGIC RHINITIS 02/27/2009  . PNEUMONIA, COMMUNITY ACQUIRED, PNEUMOCOCCAL 12/07/2008  . ASTHMA 09/03/2007  . GERD 04/16/2007  . DIVERTICULOSIS, COLON 09/03/2007  . MENOPAUSAL DISORDER 04/18/2008  . OSTEOARTHRITIS, KNEE, LEFT 11/24/2009  . JOINT EFFUSION, LEFT KNEE 02/27/2009  . KNEE PAIN, BILATERAL 06/01/2010  . LOW BACK PAIN 04/16/2007  . LEG PAIN, LEFT 06/01/2010  . WEIGHT LOSS 07/25/2008  . RASH-NONVESICULAR 07/25/2008  . Abdominal pain, generalized 09/03/2007  . BREAST CANCER, HX OF 04/16/2007  . COLONIC POLYPS, HX OF 09/03/2007  . Dementia 06/17/2012    mild  . Peripheral neuropathy 06/17/2012    Past Surgical History  Procedure Laterality Date  . Abdominal hysterectomy  1966  . Tubal ligation    . S/p left mastectomy  2011    Social History:   reports that she has never smoked. She has never used smokeless tobacco. She reports that she does not drink alcohol or use illicit drugs.  Family History  Problem Relation Age of Onset  . Cancer Sister     Breast  .  Hypertension Other   . Diabetes Other     1st degree relative  . Hypertension Maternal Grandmother     Medications: Patient's Medications  New Prescriptions   No medications on file  Previous Medications   AMLODIPINE (NORVASC) 10 MG TABLET    TAKE 1 TABLET BY MOUTH EVERY DAY   ASPIRIN 81 MG EC TABLET    Take 81 mg by mouth daily.     GABAPENTIN (NEURONTIN) 100 MG CAPSULE    Take 1 capsule (100 mg total) by mouth 3 (three) times daily.   HYDRALAZINE (APRESOLINE) 25 MG TABLET    Take 1 tablet (25 mg total) by mouth 4 (four) times daily.   MIRTAZAPINE (REMERON) 15 MG TABLET    Take 1 tablet  (15 mg total) by mouth at bedtime.   NAMENDA 5 MG TABLET    TAKE 1 TABLET BY MOUTH TWICE A DAY   POLYETHYLENE GLYCOL (MIRALAX / GLYCOLAX) PACKET    Take 17 g by mouth daily as needed (for constipation).  Modified Medications   No medications on file  Discontinued Medications   BLOOD PRESSURE MONITORING (BLOOD PRESSURE CUFF) MISC    Check blood pressure once daily as directed. DX 401.9     Physical Exam: Filed Vitals:   12/06/13 1317  BP: 160/76  Pulse: 64  Temp: 98.6 F (37 C)  TempSrc: Oral  Resp: 16  Weight: 111 lb 3.2 oz (50.44 kg)  SpO2: 96%  Physical Exam  Constitutional:  Thin black female, appears healthier this time  HENT:  Head: Normocephalic and atraumatic.  Eyes: EOM are normal. Pupils are equal, round, and reactive to light.  Cardiovascular: Normal rate, regular rhythm, normal heart sounds and intact distal pulses.   Pulmonary/Chest: Effort normal and breath sounds normal. No respiratory distress.  Abdominal: Soft. Bowel sounds are normal. She exhibits no distension and no mass. There is no tenderness.  Musculoskeletal: She exhibits no edema.  Tenderness over bilateral knees, back nontender today;  Pain worse when she walks--came in wheelchair today due to pain preventing walking altogether per granddaughter  Neurological: She is alert.  Oriented to person only, repeats questions multiple times during her visit  Skin: Skin is warm and dry.  Psychiatric: She has a normal mood and affect.     Labs reviewed: Basic Metabolic Panel:  Recent Labs  02/28/13 1818  03/04/13 0450 05/03/13 1002 10/18/13 1006  NA  --   < > 127* 137 139  K  --   < > 3.2* 3.7 5.0  CL  --   < > 91* 100 99  CO2  --   < > 27 24 20   GLUCOSE  --   < > 105* 83 93  BUN  --   < > 8 12 19   CREATININE  --   < > 0.81 0.88 0.86  CALCIUM  --   < > 8.8 9.3 9.6  TSH 0.520  --   --   --   --   < > = values in this interval not displayed. Liver Function Tests:  Recent Labs  02/28/13 1818  10/18/13 1006  AST 44* 20  ALT 19 12  ALKPHOS 17* 81  BILITOT 0.3 0.5  PROT 7.9 7.4  ALBUMIN 3.0*  --   CBC:  Recent Labs  02/28/13 1535 03/01/13 0415 03/04/13 0450 10/18/13 1006  WBC 7.8 7.2 8.1 6.7  NEUTROABS 5.0  --   --  3.1  HGB 12.3 10.5* 11.3* 10.4*  HCT 33.9* 29.8* 32.6* 30.7*  MCV 81.5 82.8 85.3 93  PLT 326 298 335  --    Lipid Panel:  Recent Labs  10/18/13 1006  HDL 62  LDLCALC 107*  TRIG 75  CHOLHDL 3.0   Lab Results  Component Value Date   HGBA1C 5.3 10/01/2011    Assessment/Plan 1. Osteoarthritis of both knees - worse than last time and this seems to be the cause of her bilateral leg pain that is in knees and calves -had ABIs done that were unremarkable by Dr. Judi Cong just before she started coming here (reviewed today) - DG Knee Complete 4 Views Left; Future - DG Knee Complete 4 Views Right; Future - check xrays to assess severity of her arthritis -start diclofenac sodium (VOLTAREN) 1 % GEL; Apply 2 g topically 4 (four) times daily. To both knees as needed for arthritis pain  Dispense: 100 g; Refill: 3 -get up slowly and use walker 2. Alzheimer's disease -stop namenda 5mg  daily -use titration pack (sample given) - Memantine HCl ER 7 & 14 & 21 &28 MG CP24; Sample pack given to use as directed  Dispense: 28 capsule; Refill: 0 - then fill Memantine HCl ER 28 MG CP24; Take 28 mg by mouth daily.  Dispense: 30 capsule; Refill: 3  Using coupon given  3. Loss of weight -has now gained weight back with remeron and appears much healthier, mood is also improved, but still not sleeping well  4. Insomnia -will try to titrate up her namenda and see if her sleep improves, cont remeron 15mg  qhs -if no help with these, would add melatonin 3mg  one hour before bed  5. Essential hypertension, benign -bp was not at goal today -increase hydralazine again--I think her bp is hard to control due to calcified blood vessels - hydrALAZINE (APRESOLINE) 50 MG tablet; Take 1  tablet (50 mg total) by mouth 4 (four) times daily.  Dispense: 90 tablet; Refill: 3  Labs/tests ordered:   Orders Placed This Encounter  Procedures  . DG Knee Complete 4 Views Left    Standing Status: Future     Number of Occurrences:      Standing Expiration Date: 02/06/2015    Order Specific Question:  Reason for Exam (SYMPTOM  OR DIAGNOSIS REQUIRED)    Answer:  bilateral knee and calf pain    Order Specific Question:  Preferred imaging location?    Answer:  [facility]  . DG Knee Complete 4 Views Right    Standing Status: Future     Number of Occurrences:      Standing Expiration Date: 02/06/2015    Order Specific Question:  Reason for Exam (SYMPTOM  OR DIAGNOSIS REQUIRED)    Answer:  bilateral knee and calf pain    Order Specific Question:  Preferred imaging location?    Answer:  [facility]    Next appt:  2 wks f/u for bp check with MAs, then 3 mos f/u with me

## 2013-12-06 NOTE — Patient Instructions (Signed)
INCREASE YOUR HYDRALAZINE TO 50MG  BY MOUTH 4X DAILY FOR BLOOD PRESSURE  USE VOLTAREN GEL 4X DAILY TO BOTH KNEES FOR ARTHRITIS PAIN.    STOP NAMENDA 5MG  DAILY. BEGIN NAMENDA XR TITRATION PACK FOR MEMORY.   WHEN YOU FINISH THE TITRATION PACK, FILL THE PRESCRIPTION FOR NAMENDA XR 28MG  USING THE COUPON WITH YOUR MEDICARE PART D INSURANCE. THIS SHOULD HELP WITH YOUR SLEEP, TOO!

## 2014-01-10 ENCOUNTER — Ambulatory Visit: Payer: Medicare Other | Admitting: Internal Medicine

## 2014-02-07 ENCOUNTER — Ambulatory Visit
Admission: RE | Admit: 2014-02-07 | Discharge: 2014-02-07 | Disposition: A | Discharge status: Home / Self Care | Payer: Medicare Other | Source: Ambulatory Visit | Attending: Internal Medicine | Admitting: Internal Medicine

## 2014-02-07 DIAGNOSIS — M170 Bilateral primary osteoarthritis of knee: Secondary | ICD-10-CM

## 2014-02-10 ENCOUNTER — Encounter: Payer: Self-pay | Admitting: Internal Medicine

## 2014-02-10 ENCOUNTER — Ambulatory Visit (INDEPENDENT_AMBULATORY_CARE_PROVIDER_SITE_OTHER): Payer: Medicare Other | Admitting: Internal Medicine

## 2014-02-10 VITALS — BP 160/78 | HR 76 | Temp 98.2°F | Resp 10

## 2014-02-10 DIAGNOSIS — J302 Other seasonal allergic rhinitis: Secondary | ICD-10-CM | POA: Insufficient documentation

## 2014-02-10 DIAGNOSIS — IMO0002 Reserved for concepts with insufficient information to code with codable children: Secondary | ICD-10-CM

## 2014-02-10 DIAGNOSIS — G309 Alzheimer's disease, unspecified: Secondary | ICD-10-CM

## 2014-02-10 DIAGNOSIS — L2089 Other atopic dermatitis: Secondary | ICD-10-CM

## 2014-02-10 DIAGNOSIS — M1710 Unilateral primary osteoarthritis, unspecified knee: Secondary | ICD-10-CM

## 2014-02-10 DIAGNOSIS — M170 Bilateral primary osteoarthritis of knee: Secondary | ICD-10-CM

## 2014-02-10 DIAGNOSIS — L209 Atopic dermatitis, unspecified: Secondary | ICD-10-CM

## 2014-02-10 DIAGNOSIS — F0280 Dementia in other diseases classified elsewhere without behavioral disturbance: Secondary | ICD-10-CM

## 2014-02-10 DIAGNOSIS — J309 Allergic rhinitis, unspecified: Secondary | ICD-10-CM

## 2014-02-10 MED ORDER — CETIRIZINE HCL 10 MG PO TABS: 10.0000 mg | ORAL_TABLET | Freq: Every day | ORAL | Status: DC

## 2014-02-10 NOTE — Progress Notes (Signed)
Patient ID: Elizabeth Peterson, female   DOB: 1919/08/05, 80 y.o.   MRN: [id number]   Location:  [facility]  Code Status: full code--I provided them with living will and hcpoa information in 9/14, but we have not received anything back at this point--need to discuss again next regular f/u visit  Allergies  Allergen Reactions  . Amlodipine Besylate     REACTION: rash and itch  . Benzodiazepines Other (See Comments)    Hx of overuse/memory issue  . Penicillins   . Sulfonamide Derivatives     Chief Complaint  Patient presents with  . Follow-up    2 week follow-up on Xray Results   . URI    Cough, facial pressure (eyes feel swollen at times), HA's off/on since last OV  . Rash    Rash on both arms, off/on     HPI: Patient is a 80 y.o. black female seen in the office today for f/u of knee xray results.    Also now c/o cough, facial pressure (feels like eyes are swollen) and headaches off and on--?allergies vs. Cold.  Has terrible cough all of the time.  Seems to have some congestion in chest that won't come out.  Sounds like she is getting strangled sometimes.  Happens at night a lot.  Denies clear symptoms of acid reflux.  Swallows well.    Has had rash off and on on both arms and across her chest.  Bothers her more overnight.  Using same detergent--tide.  No change in lotion or soap (unscented).    Pt's one daughter has been giving her bacon which is salty and bp is up--repeat remained in 160s..  Review of Systems:  Review of Systems  Constitutional: Negative for fever.  Eyes: Negative for blurred vision.  Respiratory: Negative for shortness of breath.   Cardiovascular: Negative for chest pain.  Gastrointestinal: Negative for constipation.  Genitourinary: Negative for dysuria.  Musculoskeletal: Positive for joint pain. Negative for falls.  Neurological: Negative for dizziness and headaches.  Psychiatric/Behavioral: Positive for memory  loss.    Past Medical History  Diagnosis Date  . HYPERLIPIDEMIA 04/16/2007  . ANEMIA-NOS 04/16/2007  . ANXIETY 04/16/2007  . DEPRESSION 09/03/2007  . HYPERTENSION 04/16/2007  . ATHEROSLERO NATV ART EXTREM W/INTERMIT CLAUDICAT 06/05/2010  . ALLERGIC RHINITIS 02/27/2009  . PNEUMONIA, COMMUNITY ACQUIRED, PNEUMOCOCCAL 12/07/2008  . ASTHMA 09/03/2007  . GERD 04/16/2007  . DIVERTICULOSIS, COLON 09/03/2007  . MENOPAUSAL DISORDER 04/18/2008  . OSTEOARTHRITIS, KNEE, LEFT 11/24/2009  . JOINT EFFUSION, LEFT KNEE 02/27/2009  . KNEE PAIN, BILATERAL 06/01/2010  . LOW BACK PAIN 04/16/2007  . LEG PAIN, LEFT 06/01/2010  . WEIGHT LOSS 07/25/2008  . RASH-NONVESICULAR 07/25/2008  . Abdominal pain, generalized 09/03/2007  . BREAST CANCER, HX OF 04/16/2007  . COLONIC POLYPS, HX OF 09/03/2007  . Dementia 06/17/2012    mild  . Peripheral neuropathy 06/17/2012    Past Surgical History  Procedure Laterality Date  . Abdominal hysterectomy  1966  . Tubal ligation    . S/p left mastectomy  2011    Social History:   reports that she has never smoked. She has never used smokeless tobacco. She reports that she does not drink alcohol or use illicit drugs.  Family History  Problem Relation Age of Onset  . Cancer Sister     Breast  . Hypertension Other   . Diabetes Other     1st degree relative  . Hypertension Maternal Grandmother  Medications: Patient's Medications  New Prescriptions   No medications on file  Previous Medications   AMLODIPINE (NORVASC) 10 MG TABLET    TAKE 1 TABLET BY MOUTH EVERY DAY   ASPIRIN 81 MG EC TABLET    Take 81 mg by mouth daily.     DICLOFENAC SODIUM (VOLTAREN) 1 % GEL    Apply 2 g topically 4 (four) times daily. To both knees as needed for arthritis pain   GABAPENTIN (NEURONTIN) 100 MG CAPSULE    Take 1 capsule (100 mg total) by mouth 3 (three) times daily.   HYDRALAZINE (APRESOLINE) 50 MG TABLET    Take 1 tablet (50 mg total) by mouth 4 (four) times daily.   MEMANTINE HCL ER 28 MG  CP24    Take 28 mg by mouth daily.   MEMANTINE HCL ER 7 & 14 & 21 &28 MG CP24    Sample pack given to use as directed   MIRTAZAPINE (REMERON) 15 MG TABLET    Take 1 tablet (15 mg total) by mouth at bedtime.   POLYETHYLENE GLYCOL (MIRALAX / GLYCOLAX) PACKET    Take 17 g by mouth daily as needed (for constipation).  Modified Medications   No medications on file  Discontinued Medications   No medications on file     Physical Exam: Filed Vitals:   02/10/14 1452  BP: 160/78  Pulse: 76  Temp: 98.2 F (36.8 C)  TempSrc: Oral  Resp: 10  SpO2: 99%  Physical Exam  Constitutional:  Frail black female seated in wheelchair  HENT:  Head: Normocephalic and atraumatic.  Cardiovascular: Normal rate, regular rhythm, normal heart sounds and intact distal pulses.   Pulmonary/Chest: Effort normal and breath sounds normal. She has no rales.  Abdominal: Soft. Bowel sounds are normal. She exhibits no distension and no mass. There is no tenderness.  Musculoskeletal: She exhibits tenderness. She exhibits no edema.  Left knee tender with ROM especially lateral aspect  Neurological: She is alert.  Oriented to person only  Skin: Skin is warm and dry.  Raised papular rash with scaly surface over upper chest and arms bilaterally, some evidence of excoriation  Psychiatric: She has a normal mood and affect.    Labs reviewed: Basic Metabolic Panel:  Recent Labs  02/28/13 1818  03/04/13 0450 05/03/13 1002 10/18/13 1006  NA  --   < > 127* 137 139  K  --   < > 3.2* 3.7 5.0  CL  --   < > 91* 100 99  CO2  --   < > 27 24 20   GLUCOSE  --   < > 105* 83 93  BUN  --   < > 8 12 19   CREATININE  --   < > 0.81 0.88 0.86  CALCIUM  --   < > 8.8 9.3 9.6  TSH 0.520  --   --   --   --   < > = values in this interval not displayed. Liver Function Tests:  Recent Labs  02/28/13 1818 10/18/13 1006  AST 44* 20  ALT 19 12  ALKPHOS 17* 81  BILITOT 0.3 0.5  PROT 7.9 7.4  ALBUMIN 3.0*  --    No results found  for this basename: LIPASE, AMYLASE,  in the last 8760 hours No results found for this basename: AMMONIA,  in the last 8760 hours CBC:  Recent Labs  02/28/13 1535 03/01/13 0415 03/04/13 0450 10/18/13 1006  WBC 7.8 7.2 8.1 6.7  NEUTROABS  5.0  --   --  3.1  HGB 12.3 10.5* 11.3* 10.4*  HCT 33.9* 29.8* 32.6* 30.7*  MCV 81.5 82.8 85.3 93  PLT 326 298 335  --    Lipid Panel:  Recent Labs  10/18/13 1006  HDL 62  LDLCALC 107*  TRIG 75  CHOLHDL 3.0   Lab Results  Component Value Date   HGBA1C 5.3 10/01/2011  Knee xrays reviewed.  Assessment/Plan 1. Seasonal allergies - advised they may use zyrtec just for a 2 week period to see if it helps put her symptoms at [place], but do not recommend continued use due to antihistamine side effects in older patients (reviewed them with pt and her daughter)  2. Atopic dermatitis -seems to be cause of her rash--no clear precipitant, but makes sense when she is having other allergies--can apply low dose steroid cream and take zyrtec for a couple of weeks  3. Alzheimer's disease -memory continues to decline -cont namenda XR 28 mg, may be able to add aricept next time  4. Osteoarthritis of both knees -pain in knees significant and xrays revealed worse arthritis in right than left (left hurts her more today) -advised we will schedule an appt just for knee injections next available with me -I think bp was up due to pain and anxiety coming to appt--will recheck next time  Labs/tests ordered:  None today Next appt:  15 minute f/u for knee injections of both knees next available

## 2014-03-03 ENCOUNTER — Ambulatory Visit (INDEPENDENT_AMBULATORY_CARE_PROVIDER_SITE_OTHER): Payer: Medicare Other | Admitting: Internal Medicine

## 2014-03-03 ENCOUNTER — Encounter: Payer: Self-pay | Admitting: Internal Medicine

## 2014-03-03 VITALS — BP 140/72 | HR 95 | Temp 99.8°F | Resp 10

## 2014-03-03 DIAGNOSIS — R05 Cough: Secondary | ICD-10-CM

## 2014-03-03 DIAGNOSIS — M1710 Unilateral primary osteoarthritis, unspecified knee: Secondary | ICD-10-CM

## 2014-03-03 DIAGNOSIS — R059 Cough, unspecified: Secondary | ICD-10-CM

## 2014-03-03 DIAGNOSIS — M170 Bilateral primary osteoarthritis of knee: Secondary | ICD-10-CM

## 2014-03-03 DIAGNOSIS — IMO0002 Reserved for concepts with insufficient information to code with codable children: Secondary | ICD-10-CM

## 2014-03-03 MED ORDER — TRIAMCINOLONE ACETONIDE 40 MG/ML IJ SUSP
40.0000 mg | Freq: Once | INTRAMUSCULAR | Status: AC
Start: 2014-03-03 — End: 2014-03-03
  Administered 2014-03-03: 40 mg via INTRA_ARTICULAR

## 2014-03-03 MED ORDER — TRIAMCINOLONE ACETONIDE 40 MG/ML IJ SUSP
40.0000 mg | Freq: Once | INTRAMUSCULAR | Status: AC
  Administered 2014-03-03: 40 mg via INTRA_ARTICULAR

## 2014-03-03 NOTE — Progress Notes (Signed)
Patient ID: Elizabeth Peterson, female   DOB: Nov 29, 1918, 80 y.o.   MRN: [id number]   Location:  [facility]   Allergies  Allergen Reactions  . Amlodipine Besylate     REACTION: rash and itch  . Benzodiazepines Other (See Comments)    Hx of overuse/memory issue  . Penicillins   . Sulfonamide Derivatives     Chief Complaint  Patient presents with  . Knee Problem    Bilateral knee pain- patient here today for bilateral knee injection   . URI    Ongoing cough (see last OV) - no better     HPI: Patient is a 80 y.o. black female seen in the office today for f/u of bilateral knee osteoarthritis that has not been relieved with topicals or tylenol and cannot tolerate narcotics or nsaids.    Has chronic cough and had URI, but has no related symptoms.  It is unproductive.    Review of Systems:  Review of Systems  Constitutional: Negative for fever.       Had low grade temp when arrived, but resolved by time of visit  Respiratory: Positive for cough. Negative for shortness of breath.   Cardiovascular: Negative for chest pain.  Musculoskeletal: Positive for joint pain.  Psychiatric/Behavioral: Positive for memory loss.     Past Medical History  Diagnosis Date  . HYPERLIPIDEMIA 04/16/2007  . ANEMIA-NOS 04/16/2007  . ANXIETY 04/16/2007  . DEPRESSION 09/03/2007  . HYPERTENSION 04/16/2007  . ATHEROSLERO NATV ART EXTREM W/INTERMIT CLAUDICAT 06/05/2010  . ALLERGIC RHINITIS 02/27/2009  . PNEUMONIA, COMMUNITY ACQUIRED, PNEUMOCOCCAL 12/07/2008  . ASTHMA 09/03/2007  . GERD 04/16/2007  . DIVERTICULOSIS, COLON 09/03/2007  . MENOPAUSAL DISORDER 04/18/2008  . OSTEOARTHRITIS, KNEE, LEFT 11/24/2009  . JOINT EFFUSION, LEFT KNEE 02/27/2009  . KNEE PAIN, BILATERAL 06/01/2010  . LOW BACK PAIN 04/16/2007  . LEG PAIN, LEFT 06/01/2010  . WEIGHT LOSS 07/25/2008  . RASH-NONVESICULAR 07/25/2008  . Abdominal pain, generalized 09/03/2007  . BREAST CANCER, HX OF 04/16/2007  .  COLONIC POLYPS, HX OF 09/03/2007  . Dementia 06/17/2012    mild  . Peripheral neuropathy 06/17/2012    Past Surgical History  Procedure Laterality Date  . Abdominal hysterectomy  1966  . Tubal ligation    . S/p left mastectomy  2011    Social History:   reports that she has never smoked. She has never used smokeless tobacco. She reports that she does not drink alcohol or use illicit drugs.  Family History  Problem Relation Age of Onset  . Cancer Sister     Breast  . Hypertension Other   . Diabetes Other     1st degree relative  . Hypertension Maternal Grandmother     Medications: Patient's Medications  New Prescriptions   No medications on file  Previous Medications   AMLODIPINE (NORVASC) 10 MG TABLET    TAKE 1 TABLET BY MOUTH EVERY DAY   ASPIRIN 81 MG EC TABLET    Take 81 mg by mouth daily.     CETIRIZINE (ZYRTEC) 10 MG TABLET    Take 1 tablet (10 mg total) by mouth daily.   DICLOFENAC SODIUM (VOLTAREN) 1 % GEL    Apply 2 g topically 4 (four) times daily. To both knees as needed for arthritis pain   GABAPENTIN (NEURONTIN) 100 MG CAPSULE    Take 1 capsule (100 mg total) by mouth 3 (three) times daily.   HYDRALAZINE (APRESOLINE) 50 MG TABLET  Take 1 tablet (50 mg total) by mouth 4 (four) times daily.   MEMANTINE HCL ER 28 MG CP24    Take 28 mg by mouth daily.   MEMANTINE HCL ER 7 & 14 & 21 &28 MG CP24    Sample pack given to use as directed   MIRTAZAPINE (REMERON) 15 MG TABLET    Take 1 tablet (15 mg total) by mouth at bedtime.   POLYETHYLENE GLYCOL (MIRALAX / GLYCOLAX) PACKET    Take 17 g by mouth daily as needed (for constipation).  Modified Medications   No medications on file  Discontinued Medications   No medications on file     Physical Exam: Filed Vitals:   03/03/14 1504  BP: 140/72  Pulse: 95  Temp: 99.8 F (37.7 C)  TempSrc: Oral  Resp: 10  SpO2: 98%  Physical Exam  Cardiovascular: Normal rate, regular rhythm, normal heart sounds and intact distal  pulses.   Pulmonary/Chest: Effort normal and breath sounds normal. No respiratory distress.  Abdominal: Soft. Bowel sounds are normal. She exhibits no distension and no mass. There is no tenderness.  Musculoskeletal: She exhibits tenderness.  Of bilateral knees, left greater than right with small medial effusion on left  Neurological: She is alert.  Skin: Skin is warm and dry.   Labs reviewed: Basic Metabolic Panel:  Recent Labs  03/04/13 0450 05/03/13 1002 10/18/13 1006  NA 127* 137 139  K 3.2* 3.7 5.0  CL 91* 100 99  CO2 27 24 20   GLUCOSE 105* 83 93  BUN 8 12 19   CREATININE 0.81 0.88 0.86  CALCIUM 8.8 9.3 9.6   Liver Function Tests:  Recent Labs  10/18/13 1006  AST 20  ALT 12  ALKPHOS 81  BILITOT 0.5  PROT 7.4  CBC:  Recent Labs  03/04/13 0450 10/18/13 1006  WBC 8.1 6.7  NEUTROABS  --  3.1  HGB 11.3* 10.4*  HCT 32.6* 30.7*  MCV 85.3 93  PLT 335  --    Lipid Panel:  Recent Labs  10/18/13 1006  HDL 62  LDLCALC 107*  TRIG 75  CHOLHDL 3.0   Lab Results  Component Value Date   HGBA1C 5.3 10/01/2011    Assessment/Plan 1. Osteoarthritis of both knees -consent obtained--risks, benefits, alternatives reviewed -pt not receiving relief with current therapies and has had benefit from injections given by ortho in the past (none recently) -area cleansed with betadine bilaterally, numbed first with topical spray, then knees injected with kenalog with 1% lidocaine 1:1  - triamcinolone acetonide (KENALOG-40) injection 40 mg; Inject 1 mL (40 mg total) into the articular space once. To both knees  2. Cough -low grade temp resolved and lungs clear -I think this is likely postnasal drip, is not on ace -may also have some GERD  Labs/tests ordered:  none Next appt:  3 mos.

## 2014-03-09 ENCOUNTER — Ambulatory Visit: Payer: Medicare Other | Admitting: Nurse Practitioner

## 2014-03-24 ENCOUNTER — Telehealth: Payer: Self-pay

## 2014-03-24 MED ORDER — TRAMADOL HCL 50 MG PO TABS: 50.0000 mg | ORAL_TABLET | Freq: Three times a day (TID) | ORAL | Status: DC | PRN

## 2014-03-24 NOTE — Telephone Encounter (Signed)
Patient's grand-daughter was informed

## 2014-03-24 NOTE — Telephone Encounter (Signed)
Patient with unbearable knee pain. Patient has pending appointment in September. Patient's daughter would like to know what to do about unbearable leg pain.  Please advise (no available appointments today)

## 2014-03-24 NOTE — Telephone Encounter (Signed)
If unbearable, will prescribe tramadol 50mg  po tid prn severe pain.  This may cause constipation so she should use a stool softener like senna s 2 tabs daily.

## 2014-04-18 ENCOUNTER — Other Ambulatory Visit: Payer: Self-pay | Admitting: Internal Medicine

## 2014-04-18 DIAGNOSIS — G309 Alzheimer's disease, unspecified: Principal | ICD-10-CM

## 2014-04-18 DIAGNOSIS — F0280 Dementia in other diseases classified elsewhere without behavioral disturbance: Secondary | ICD-10-CM

## 2014-04-18 MED ORDER — MEMANTINE HCL ER 28 MG PO CP24: 28.0000 mg | ORAL_CAPSULE | Freq: Every day | ORAL | Status: AC

## 2014-04-18 NOTE — Telephone Encounter (Signed)
[facility] 

## 2014-05-23 ENCOUNTER — Telehealth: Payer: Self-pay | Admitting: *Deleted

## 2014-05-23 NOTE — Telephone Encounter (Signed)
Patient caregiver, Olin Hauser, called and wanted a voucher for [employer]. Voucher left up front and [employer].

## 2014-06-02 ENCOUNTER — Encounter: Payer: Self-pay | Admitting: Internal Medicine

## 2014-06-02 ENCOUNTER — Ambulatory Visit (INDEPENDENT_AMBULATORY_CARE_PROVIDER_SITE_OTHER): Payer: Medicare Other | Admitting: Internal Medicine

## 2014-06-02 VITALS — BP 180/70 | HR 95 | Temp 98.5°F | Resp 18

## 2014-06-02 DIAGNOSIS — R05 Cough: Secondary | ICD-10-CM

## 2014-06-02 DIAGNOSIS — M1710 Unilateral primary osteoarthritis, unspecified knee: Secondary | ICD-10-CM

## 2014-06-02 DIAGNOSIS — M170 Bilateral primary osteoarthritis of knee: Secondary | ICD-10-CM | POA: Insufficient documentation

## 2014-06-02 DIAGNOSIS — L2089 Other atopic dermatitis: Secondary | ICD-10-CM

## 2014-06-02 DIAGNOSIS — I10 Essential (primary) hypertension: Secondary | ICD-10-CM

## 2014-06-02 DIAGNOSIS — G309 Alzheimer's disease, unspecified: Secondary | ICD-10-CM

## 2014-06-02 DIAGNOSIS — R059 Cough, unspecified: Secondary | ICD-10-CM

## 2014-06-02 DIAGNOSIS — G609 Hereditary and idiopathic neuropathy, unspecified: Secondary | ICD-10-CM

## 2014-06-02 DIAGNOSIS — F0280 Dementia in other diseases classified elsewhere without behavioral disturbance: Secondary | ICD-10-CM

## 2014-06-02 DIAGNOSIS — G629 Polyneuropathy, unspecified: Secondary | ICD-10-CM

## 2014-06-02 DIAGNOSIS — L209 Atopic dermatitis, unspecified: Secondary | ICD-10-CM

## 2014-06-02 MED ORDER — CETIRIZINE HCL 10 MG PO TABS: 10.0000 mg | ORAL_TABLET | Freq: Every day | ORAL | Status: AC

## 2014-06-02 MED ORDER — TRAMADOL HCL 50 MG PO TABS: 50.0000 mg | ORAL_TABLET | Freq: Three times a day (TID) | ORAL | Status: DC | PRN

## 2014-06-02 NOTE — Progress Notes (Signed)
Patient ID: Elizabeth Peterson, female   DOB: 10/31/1918, 80 y.o.   MRN: [id number]   Location:  [facility]  Code Status: full code  Allergies  Allergen Reactions  . Amlodipine Besylate     REACTION: rash and itch  . Benzodiazepines Other (See Comments)    Hx of overuse/memory issue  . Penicillins   . Sulfonamide Derivatives     Chief Complaint  Patient presents with  . Medical Management of Chronic Issues    ? cough, rash on chest xmonth,    HPI: Patient is a 80 y.o. seen in the office today for medical mgt of chronic diseases. Zyrtec may have helped cough.    Had rash on arms before, now on chest.  Using cortisone cream.    Eyes get watery and irritated.  Comes and goes.  Still has tickle in throat.    Review of Systems:  Review of Systems  HENT: Positive for congestion.   Eyes: Positive for blurred vision and redness.       Itching  Respiratory: Positive for cough. Negative for sputum production and shortness of breath.   Cardiovascular: Negative for chest pain.  Gastrointestinal: Negative for abdominal pain, constipation, blood in stool and melena.  Genitourinary: Negative for dysuria, urgency and frequency.  Musculoskeletal: Positive for joint pain. Negative for falls.       Knee pain  Neurological: Negative for headaches.  Psychiatric/Behavioral: Positive for memory loss.    Past Medical History  Diagnosis Date  . HYPERLIPIDEMIA 04/16/2007  . ANEMIA-NOS 04/16/2007  . ANXIETY 04/16/2007  . DEPRESSION 09/03/2007  . HYPERTENSION 04/16/2007  . ATHEROSLERO NATV ART EXTREM W/INTERMIT CLAUDICAT 06/05/2010  . ALLERGIC RHINITIS 02/27/2009  . PNEUMONIA, COMMUNITY ACQUIRED, PNEUMOCOCCAL 12/07/2008  . ASTHMA 09/03/2007  . GERD 04/16/2007  . DIVERTICULOSIS, COLON 09/03/2007  . MENOPAUSAL DISORDER 04/18/2008  . OSTEOARTHRITIS, KNEE, LEFT 11/24/2009  . JOINT EFFUSION, LEFT KNEE 02/27/2009  . KNEE PAIN, BILATERAL 06/01/2010  . LOW BACK PAIN  04/16/2007  . LEG PAIN, LEFT 06/01/2010  . WEIGHT LOSS 07/25/2008  . RASH-NONVESICULAR 07/25/2008  . Abdominal pain, generalized 09/03/2007  . BREAST CANCER, HX OF 04/16/2007  . COLONIC POLYPS, HX OF 09/03/2007  . Dementia 06/17/2012    mild  . Peripheral neuropathy 06/17/2012    Past Surgical History  Procedure Laterality Date  . Abdominal hysterectomy  1966  . Tubal ligation    . S/p left mastectomy  2011    Social History:   reports that she has never smoked. She has never used smokeless tobacco. She reports that she does not drink alcohol or use illicit drugs.  Family History  Problem Relation Age of Onset  . Cancer Sister     Breast  . Hypertension Other   . Diabetes Other     1st degree relative  . Hypertension Maternal Grandmother     Medications: Patient's Medications  New Prescriptions   No medications on file  Previous Medications   AMLODIPINE (NORVASC) 10 MG TABLET    TAKE 1 TABLET BY MOUTH EVERY DAY   ASPIRIN 81 MG EC TABLET    Take 81 mg by mouth daily.     CETIRIZINE (ZYRTEC) 10 MG TABLET    Take 1 tablet (10 mg total) by mouth daily.   DICLOFENAC SODIUM (VOLTAREN) 1 % GEL    Apply 2 g topically 4 (four) times daily. To both knees as needed for arthritis pain   GABAPENTIN (NEURONTIN) 100 MG  CAPSULE    Take 1 capsule (100 mg total) by mouth 3 (three) times daily.   HYDRALAZINE (APRESOLINE) 50 MG TABLET    Take 1 tablet (50 mg total) by mouth 4 (four) times daily.   MEMANTINE HCL ER 28 MG CP24    Take 28 mg by mouth daily.   MIRTAZAPINE (REMERON) 15 MG TABLET    TAKE 1 TABLET BY MOUTH EVERY DAY AT BEDTIME   POLYETHYLENE GLYCOL (MIRALAX / GLYCOLAX) PACKET    Take 17 g by mouth daily as needed (for constipation).   TRAMADOL (ULTRAM) 50 MG TABLET    Take 1 tablet (50 mg total) by mouth 3 (three) times daily as needed.  Modified Medications   No medications on file  Discontinued Medications   MEMANTINE HCL ER 7 & 14 & 21 &28 MG CP24    Sample pack given to use as  directed     Physical Exam: Filed Vitals:   06/02/14 1439  BP: 180/70  Pulse: 95  Temp: 98.5 F (36.9 C)  TempSrc: Oral  Resp: 18  SpO2: 98%  Physical Exam  Constitutional: No distress.  Frail black female  Cardiovascular: Normal rate, regular rhythm, normal heart sounds and intact distal pulses.   Pulmonary/Chest: Effort normal.  Few scattered rhonchi  Abdominal: Soft. Bowel sounds are normal.  Musculoskeletal: Normal range of motion. She exhibits tenderness.  Of bilateral knees  Neurological: She is alert.  Oriented to person only  Skin: Skin is warm and dry. Rash noted. There is erythema.  Of chest wall around clavicles; none on arms at present  Psychiatric: She has a normal mood and affect.    Labs reviewed: Basic Metabolic Panel:  Recent Labs  10/18/13 1006  NA 139  K 5.0  CL 99  CO2 20  GLUCOSE 93  BUN 19  CREATININE 0.86  CALCIUM 9.6   Liver Function Tests:  Recent Labs  10/18/13 1006  AST 20  ALT 12  ALKPHOS 81  BILITOT 0.5  PROT 7.4  CBC:  Recent Labs  10/18/13 1006  WBC 6.7  NEUTROABS 3.1  HGB 10.4*  HCT 30.7*  MCV 93   Lipid Panel:  Recent Labs  10/18/13 1006  HDL 62  LDLCALC 107*  TRIG 75  CHOLHDL 3.0   Lab Results  Component Value Date   HGBA1C 5.3 10/01/2011   Assessment/Plan 1. Atopic dermatitis - may actually be reaction to amlodipine which is entered as an allergy, but was prescribed by her previous doctor anyway -hydrocortisone cream to chest - cetirizine (ZYRTEC) 10 MG tablet; Take 1 tablet (10 mg total) by mouth daily.  Dispense: 30 tablet; Refill: 3  2. Cough -seems to be severe allergies--has irritation of throat, cough, congestion and redness and irritation of eyes - cetirizine (ZYRTEC) 10 MG tablet; Take 1 tablet (10 mg total) by mouth daily.  Dispense: 30 tablet; Refill: 3 -if this does not get better after faithful use of zyrtec for 3-4 wks, will obtain CT chest to assess for cause of persistent  cough  3. Unspecified essential hypertension -bp elevated today -has possible reaction to amlodipine so I stopped it -cont hydralazine 50mg  po tid -start bystolic 5mg  daily for blood pressure  4. Primary osteoarthritis of both knees -not controlled by tylenol only and using too much ibuprofen so Rx given for tramadol - traMADol (ULTRAM) 50 MG tablet; Take 1 tablet (50 mg total) by mouth 3 (three) times daily as needed.  Dispense: 90 tablet; Refill: 0  5. Alzheimer's disease -cont namenda XR 28mg  daily  6. Peripheral neuropathy -cont gabapentin 100mg  po tid  Labs/tests ordered: no new labs today, but will f/u at next visit  Next appt:  3 mos

## 2014-09-01 ENCOUNTER — Encounter: Payer: Self-pay | Admitting: Internal Medicine

## 2014-09-01 ENCOUNTER — Ambulatory Visit (INDEPENDENT_AMBULATORY_CARE_PROVIDER_SITE_OTHER): Payer: Medicare Other | Admitting: Internal Medicine

## 2014-09-01 VITALS — BP 162/72 | HR 82 | Temp 98.6°F | Resp 18

## 2014-09-01 DIAGNOSIS — M170 Bilateral primary osteoarthritis of knee: Secondary | ICD-10-CM

## 2014-09-01 DIAGNOSIS — E43 Unspecified severe protein-calorie malnutrition: Secondary | ICD-10-CM

## 2014-09-01 DIAGNOSIS — I10 Essential (primary) hypertension: Secondary | ICD-10-CM

## 2014-09-01 DIAGNOSIS — R531 Weakness: Secondary | ICD-10-CM

## 2014-09-01 DIAGNOSIS — F0280 Dementia in other diseases classified elsewhere without behavioral disturbance: Secondary | ICD-10-CM

## 2014-09-01 DIAGNOSIS — E785 Hyperlipidemia, unspecified: Secondary | ICD-10-CM

## 2014-09-01 DIAGNOSIS — G309 Alzheimer's disease, unspecified: Secondary | ICD-10-CM

## 2014-09-01 MED ORDER — TRAMADOL HCL 50 MG PO TABS: 50.0000 mg | ORAL_TABLET | Freq: Four times a day (QID) | ORAL | Status: DC | PRN

## 2014-09-01 NOTE — Patient Instructions (Signed)
Please check her bp daily and let me know if it is staying over 150/90.  I will need to change her medicine.

## 2014-09-01 NOTE — Progress Notes (Signed)
Patient ID: Elizabeth Peterson, female   DOB: 1918/09/30, 80 y.o.   MRN: [id number]   Location:  [facility]  Code Status: full code  Allergies  Allergen Reactions  . Amlodipine Besylate     REACTION: rash and itch  . Benzodiazepines Other (See Comments)    Hx of overuse/memory issue  . Penicillins   . Sulfonamide Derivatives     Chief Complaint  Patient presents with  . Medical Management of Chronic Issues    legs hurting , weak     HPI: Patient is a 80 y.o. black female seen in the office today for medical mgt of chronic diseases.  She c/o chronic leg pain (knee pain) and weakness.  Doesn't want to move around.  Is unable to walk at all right now.  Needs something for the pain in her legs.  Her granddaughter administers her meds  No appetite, but weight up somehow.  Doesn't sleep well.  Has gained weight per her friend who is here with her.   Her granddaughter has not been checking her bp like she's supposed to.  Repeated BP and it was 142/76 with the proper sized cuff.  Review of Systems:  Review of Systems  Constitutional: Positive for malaise/fatigue. Negative for fever and chills.       Her friend thinks she gained weight--heavier to lift  HENT: Positive for hearing loss. Negative for congestion and sore throat.   Eyes: Negative for blurred vision.       Glasses  Respiratory: Negative for cough and shortness of breath.   Cardiovascular: Negative for chest pain and leg swelling.  Gastrointestinal: Negative for abdominal pain, constipation, blood in stool and melena.  Genitourinary: Negative for dysuria.  Musculoskeletal: Negative for myalgias and falls.  Neurological: Positive for weakness. Negative for dizziness and headaches.  Endo/Heme/Allergies: Does not bruise/bleed easily.  Psychiatric/Behavioral: Positive for memory loss. The patient has insomnia.      Past Medical History  Diagnosis Date  . HYPERLIPIDEMIA 04/16/2007  .  ANEMIA-NOS 04/16/2007  . ANXIETY 04/16/2007  . DEPRESSION 09/03/2007  . HYPERTENSION 04/16/2007  . ATHEROSLERO NATV ART EXTREM W/INTERMIT CLAUDICAT 06/05/2010  . ALLERGIC RHINITIS 02/27/2009  . PNEUMONIA, COMMUNITY ACQUIRED, PNEUMOCOCCAL 12/07/2008  . ASTHMA 09/03/2007  . GERD 04/16/2007  . DIVERTICULOSIS, COLON 09/03/2007  . MENOPAUSAL DISORDER 04/18/2008  . OSTEOARTHRITIS, KNEE, LEFT 11/24/2009  . JOINT EFFUSION, LEFT KNEE 02/27/2009  . KNEE PAIN, BILATERAL 06/01/2010  . LOW BACK PAIN 04/16/2007  . LEG PAIN, LEFT 06/01/2010  . WEIGHT LOSS 07/25/2008  . RASH-NONVESICULAR 07/25/2008  . Abdominal pain, generalized 09/03/2007  . BREAST CANCER, HX OF 04/16/2007  . COLONIC POLYPS, HX OF 09/03/2007  . Dementia 06/17/2012    mild  . Peripheral neuropathy 06/17/2012    Past Surgical History  Procedure Laterality Date  . Abdominal hysterectomy  1966  . Tubal ligation    . S/p left mastectomy  2011    Social History:   reports that she has never smoked. She has never used smokeless tobacco. She reports that she does not drink alcohol or use illicit drugs.  Family History  Problem Relation Age of Onset  . Cancer Sister     Breast  . Hypertension Other   . Diabetes Other     1st degree relative  . Hypertension Maternal Grandmother     Medications: Patient's Medications  New Prescriptions   No medications on file  Previous Medications   ASPIRIN 81 MG  EC TABLET    Take 81 mg by mouth daily.     CETIRIZINE (ZYRTEC) 10 MG TABLET    Take 1 tablet (10 mg total) by mouth daily.   DICLOFENAC SODIUM (VOLTAREN) 1 % GEL    Apply 2 g topically 4 (four) times daily. To both knees as needed for arthritis pain   GABAPENTIN (NEURONTIN) 100 MG CAPSULE    Take 1 capsule (100 mg total) by mouth 3 (three) times daily.   HYDRALAZINE (APRESOLINE) 50 MG TABLET    Take 1 tablet (50 mg total) by mouth 4 (four) times daily.   MEMANTINE HCL ER 28 MG CP24    Take 28 mg by mouth daily.   MIRTAZAPINE (REMERON) 15 MG TABLET     TAKE 1 TABLET BY MOUTH EVERY DAY AT BEDTIME   NEBIVOLOL (BYSTOLIC) 5 MG TABLET    Take 5 mg by mouth daily.   POLYETHYLENE GLYCOL (MIRALAX / GLYCOLAX) PACKET    Take 17 g by mouth daily as needed (for constipation).   TRAMADOL (ULTRAM) 50 MG TABLET    Take 1 tablet (50 mg total) by mouth 3 (three) times daily as needed.  Modified Medications   No medications on file  Discontinued Medications   No medications on file     Physical Exam: Filed Vitals:   09/01/14 1446  BP: 162/72  Pulse: 82  Temp: 98.6 F (37 C)  TempSrc: Oral  Resp: 18  SpO2: 97%  Physical Exam  Constitutional:  Frail black female  Cardiovascular: Normal rate, regular rhythm and normal heart sounds.   Pulmonary/Chest: Effort normal and breath sounds normal. No respiratory distress.  Abdominal: Soft. Bowel sounds are normal. She exhibits no distension.  Musculoskeletal:  Not walking except bathroom to bedroom  Neurological: She is alert.  Oriented to person only, does not remember me visit to visit  Psychiatric: She has a normal mood and affect.     Labs reviewed: Basic Metabolic Panel:  Recent Labs  10/18/13 1006  NA 139  K 5.0  CL 99  CO2 20  GLUCOSE 93  BUN 19  CREATININE 0.86  CALCIUM 9.6   Liver Function Tests:  Recent Labs  10/18/13 1006  AST 20  ALT 12  ALKPHOS 81  BILITOT 0.5  PROT 7.4   No results for input(s): LIPASE, AMYLASE in the last 8760 hours. No results for input(s): AMMONIA in the last 8760 hours. CBC:  Recent Labs  10/18/13 1006  WBC 6.7  NEUTROABS 3.1  HGB 10.4*  HCT 30.7*  MCV 93   Lipid Panel:  Recent Labs  10/18/13 1006  HDL 62  LDLCALC 107*  TRIG 75  CHOLHDL 3.0   Lab Results  Component Value Date   HGBA1C 5.3 10/01/2011   Assessment/Plan 1. Primary osteoarthritis of both knees - Ambulatory referral to [place] for therapy due to weakness, knee pain and gait instability -no longer walking like she was -increased tramadol to qid -  traMADol (ULTRAM) 50 MG tablet; Take 1 tablet (50 mg total) by mouth 4 (four) times daily as needed for moderate pain.  Dispense: 120 tablet; Refill: 0  2. Essential hypertension, benign -bp was elevated on first check today, but large cuff had been used--recheck 142--will ask home health and her granddaughter to keep an eye on bp -if elevated on 3 SEPARATE DAYS, would increase bystolic - Ambulatory referral to [place] - CBC With differential/Platelet; Future - Comprehensive metabolic panel; Future - Lipid panel; Future  3. Alzheimer's disease - cont namenda and remeron for appetite and sleep - Ambulatory referral to [facility]  4. Severe protein-calorie malnutrition - weight very low but could not get up on scale today due to generalized weakness  - Ambulatory referral to [place] - CBC With differential/Platelet; Future - TSH; Future  5. Generalized weakness -f/u labs before next appt -needs therapy again to strengthen proximal muscles and keep her walking a little longer if possible - Ambulatory referral to [place] - CBC With differential/Platelet; Future - Comprehensive metabolic panel; Future - TSH; Future  6. Hyperlipidemia - Lipid panel; Future  Labs/tests ordered:   Orders Placed This Encounter  Procedures  . CBC With differential/Platelet    Standing Status: Future     Number of Occurrences:      Standing Expiration Date: 03/03/2015  . Comprehensive metabolic panel    Standing Status: Future     Number of Occurrences:      Standing Expiration Date: 03/03/2015    Order Specific Question:  Has the patient fasted?    Answer:  Yes  . Lipid panel    Standing Status: Future     Number of Occurrences:      Standing Expiration Date: 03/03/2015    Order Specific Question:  Has the patient fasted?    Answer:  Yes  . TSH    Standing Status: Future     Number of Occurrences:      Standing Expiration Date: 03/03/2015  . Ambulatory referral to Home Health     Referral Priority:  Routine    Referral Type:  Home Health Care    Referral Reason:  Specialty Services Required    Requested Specialty:  [place]    Number of Visits Requested:  1   FAMILY REFUSED FLU SHOT FOR PATIENT  Next appt:  3 mos with fasting labs day of visit  Termaine Roupp L. Nashla Althoff, D.O. [facility] [address], [place] [address] Cell Phone (Mon-Fri 8am-5pm):  [phone] On Call:  [phone] & follow prompts after 5pm & weekends Office Phone:  [phone] Office Fax:  [phone]

## 2014-09-12 DIAGNOSIS — R531 Weakness: Secondary | ICD-10-CM

## 2014-09-12 DIAGNOSIS — G309 Alzheimer's disease, unspecified: Secondary | ICD-10-CM

## 2014-09-12 DIAGNOSIS — M170 Bilateral primary osteoarthritis of knee: Secondary | ICD-10-CM

## 2014-10-18 ENCOUNTER — Other Ambulatory Visit: Payer: Self-pay | Admitting: *Deleted

## 2014-10-18 MED ORDER — AMBULATORY NON FORMULARY MEDICATION: Status: AC

## 2014-10-18 NOTE — Telephone Encounter (Signed)
Received fax from [facility] # [phone] Fax: [phone] needing a Wheelchair and Cushion Rx to be faxed to [facility]. Printed and given to Dr. Mariea Clonts to review and sign.

## 2014-12-02 ENCOUNTER — Ambulatory Visit: Payer: Medicare Other | Admitting: Internal Medicine

## 2014-12-02 ENCOUNTER — Other Ambulatory Visit: Payer: Medicare Other

## 2014-12-02 DIAGNOSIS — I10 Essential (primary) hypertension: Secondary | ICD-10-CM

## 2014-12-02 DIAGNOSIS — E785 Hyperlipidemia, unspecified: Secondary | ICD-10-CM

## 2014-12-02 DIAGNOSIS — E43 Unspecified severe protein-calorie malnutrition: Secondary | ICD-10-CM

## 2014-12-02 DIAGNOSIS — R531 Weakness: Secondary | ICD-10-CM

## 2014-12-03 LAB — LIPID PANEL
Chol/HDL Ratio: 3.6 ratio units (ref 0.0–4.4)
Cholesterol, Total: 185 mg/dL (ref 100–199)
HDL: 52 mg/dL (ref >39)
LDL Calculated: 116 mg/dL — ABNORMAL HIGH (ref 0–99)
Triglycerides: 85 mg/dL (ref 0–149)
VLDL Cholesterol Cal: 17 mg/dL (ref 5–40)

## 2014-12-03 LAB — CBC WITH DIFFERENTIAL
Basophils Absolute: 0.1 10*3/uL (ref 0.0–0.2)
Basos: 1 %
Eos: 7 %
Eosinophils Absolute: 0.5 10*3/uL — ABNORMAL HIGH (ref 0.0–0.4)
HCT: 30.3 % — ABNORMAL LOW (ref 34.0–46.6)
Hemoglobin: 9.9 g/dL — ABNORMAL LOW (ref 11.1–15.9)
Immature Grans (Abs): 0 10*3/uL (ref 0.0–0.1)
Immature Granulocytes: 0 %
Lymphocytes Absolute: 2 10*3/uL (ref 0.7–3.1)
Lymphs: 30 %
MCH: 30.1 pg (ref 26.6–33.0)
MCHC: 32.7 g/dL (ref 31.5–35.7)
MCV: 92 fL (ref 79–97)
Monocytes Absolute: 0.5 10*3/uL (ref 0.1–0.9)
Monocytes: 7 %
Neutrophils Absolute: 3.6 10*3/uL (ref 1.4–7.0)
Neutrophils Relative %: 55 %
RBC: 3.29 x10E6/uL — ABNORMAL LOW (ref 3.77–5.28)
RDW: 15 % (ref 12.3–15.4)
WBC: 6.7 10*3/uL (ref 3.4–10.8)

## 2014-12-03 LAB — COMPREHENSIVE METABOLIC PANEL
ALT: 7 IU/L (ref 0–32)
AST: 17 IU/L (ref 0–40)
Albumin/Globulin Ratio: 1.2 (ref 1.1–2.5)
Albumin: 3.7 g/dL (ref 3.2–4.6)
Alkaline Phosphatase: 63 IU/L (ref 39–117)
BUN/Creatinine Ratio: 10 — ABNORMAL LOW (ref 11–26)
BUN: 8 mg/dL — ABNORMAL LOW (ref 10–36)
Bilirubin Total: 0.4 mg/dL (ref 0.0–1.2)
CO2: 25 mmol/L (ref 18–29)
Calcium: 9 mg/dL (ref 8.7–10.3)
Chloride: 105 mmol/L (ref 97–108)
Creatinine, Ser: 0.8 mg/dL (ref 0.57–1.00)
GFR calc Af Amer: 73 mL/min/{1.73_m2} (ref >59)
GFR calc non Af Amer: 64 mL/min/{1.73_m2} (ref >59)
Globulin, Total: 3.1 g/dL (ref 1.5–4.5)
Glucose: 91 mg/dL (ref 65–99)
Potassium: 3.9 mmol/L (ref 3.5–5.2)
Sodium: 144 mmol/L (ref 134–144)
Total Protein: 6.8 g/dL (ref 6.0–8.5)

## 2014-12-03 LAB — TSH: TSH: 2.21 u[IU]/mL (ref 0.450–4.500)

## 2014-12-07 LAB — B12 AND FOLATE PANEL
Folate: 4.9 ng/mL (ref >3.0)
Vitamin B-12: 162 pg/mL — ABNORMAL LOW (ref 211–946)

## 2014-12-07 LAB — SPECIMEN STATUS REPORT

## 2014-12-09 ENCOUNTER — Telehealth: Payer: Self-pay

## 2014-12-09 NOTE — Telephone Encounter (Signed)
Message left on triage voicemail: Patient needs someone to call to discuss labs:  I called patient, and spoke with her daughter Olin Hauser:  Anemia has worsened. Will need to do FOBT at appt. Oddly normocytic. Also let's add b12/folate and ferritin to her labs if possible. Low vitamin b12 level. Start cyanocobalamin 2000 mcg a day for now. Check b12 level in 3 months. (clarified all lab values).

## 2015-01-19 ENCOUNTER — Ambulatory Visit (INDEPENDENT_AMBULATORY_CARE_PROVIDER_SITE_OTHER): Payer: Medicare Other | Admitting: Internal Medicine

## 2015-01-19 ENCOUNTER — Encounter: Payer: Self-pay | Admitting: Internal Medicine

## 2015-01-19 VITALS — BP 138/80 | HR 83 | Temp 98.6°F

## 2015-01-19 DIAGNOSIS — I10 Essential (primary) hypertension: Secondary | ICD-10-CM | POA: Diagnosis not present

## 2015-01-19 DIAGNOSIS — R531 Weakness: Secondary | ICD-10-CM | POA: Diagnosis not present

## 2015-01-19 DIAGNOSIS — E785 Hyperlipidemia, unspecified: Secondary | ICD-10-CM

## 2015-01-19 DIAGNOSIS — G309 Alzheimer's disease, unspecified: Secondary | ICD-10-CM

## 2015-01-19 DIAGNOSIS — M170 Bilateral primary osteoarthritis of knee: Secondary | ICD-10-CM

## 2015-01-19 DIAGNOSIS — F0280 Dementia in other diseases classified elsewhere without behavioral disturbance: Secondary | ICD-10-CM

## 2015-01-19 DIAGNOSIS — E43 Unspecified severe protein-calorie malnutrition: Secondary | ICD-10-CM

## 2015-01-19 NOTE — Progress Notes (Signed)
Patient ID: Elizabeth Peterson, female   DOB: [birth year], 81 y.o.   MRN: [id number]   Location:  [facility]  Code Status: full code Goals of Care: Advanced Directive information Does patient have an advance directive?: No, Would patient like information on creating an advanced directive?: Yes - Educational materials given (for MOST form)   Allergies  Allergen Reactions  . Amlodipine Besylate     REACTION: rash and itch  . Benzodiazepines Other (See Comments)    Hx of overuse/memory issue  . Penicillins   . Sulfonamide Derivatives     Chief Complaint  Patient presents with  . Medical Management of Chronic Issues    3 month follow-up, labs completed 12/02/14. Patient with constant leg pain, tramadol is not effective.   . Medication Management    Remeron is not helping, patient not sleeping at night    HPI: Patient is a 81 y.o. black female seen in the office today for med mgt of chronic diseases.    Still not sleeping well.  Using remeron 15mg .  Tried giving it earlier, but no benefit.   Namenda and b12 seem to help appetite per her daughter--did not eat when out of namenda for a few days.   Tramadol--using 4x per day, still having pain.  Can't stand due to knee pain.  Tried voltaren gel also w/o benefit.   Use icy hot, bengay, mineral ice.   Her behind hurts.  No pressure sores.   Has fallen a few times, not lately.  Pt says she fell, but she is in her wheelchair and does not get up.  Cannot get up.   Holds onto couch and potty chair is next to it.   Her daughter must insist that pt finish them.    Review of Systems:  Review of Systems  Constitutional: Negative for fever and chills.  HENT: Positive for hearing loss. Negative for congestion.   Eyes: Positive for blurred vision.  Respiratory: Negative for shortness of breath.   Cardiovascular: Positive for chest pain. Negative for leg swelling.       C/o chest pain once    Gastrointestinal: Negative for abdominal pain, constipation, blood in stool and melena.  Genitourinary: Negative for dysuria.  Musculoskeletal: Positive for joint pain and falls. Negative for myalgias.  Skin: Negative for rash.  Neurological: Negative for dizziness and loss of consciousness.  Psychiatric/Behavioral: Positive for memory loss. Negative for depression. The patient has insomnia.      Past Medical History  Diagnosis Date  . HYPERLIPIDEMIA 04/16/2007  . ANEMIA-NOS 04/16/2007  . ANXIETY 04/16/2007  . DEPRESSION 09/03/2007  . HYPERTENSION 04/16/2007  . ATHEROSLERO NATV ART EXTREM W/INTERMIT CLAUDICAT 06/05/2010  . ALLERGIC RHINITIS 02/27/2009  . PNEUMONIA, COMMUNITY ACQUIRED, PNEUMOCOCCAL 12/07/2008  . ASTHMA 09/03/2007  . GERD 04/16/2007  . DIVERTICULOSIS, COLON 09/03/2007  . MENOPAUSAL DISORDER 04/18/2008  . OSTEOARTHRITIS, KNEE, LEFT 11/24/2009  . JOINT EFFUSION, LEFT KNEE 02/27/2009  . KNEE PAIN, BILATERAL 06/01/2010  . LOW BACK PAIN 04/16/2007  . LEG PAIN, LEFT 06/01/2010  . WEIGHT LOSS 07/25/2008  . RASH-NONVESICULAR 07/25/2008  . Abdominal pain, generalized 09/03/2007  . BREAST CANCER, HX OF 04/16/2007  . COLONIC POLYPS, HX OF 09/03/2007  . Dementia 06/17/2012    mild  . Peripheral neuropathy 06/17/2012    Past Surgical History  Procedure Laterality Date  . Abdominal hysterectomy  1966  . Tubal ligation    . S/p left mastectomy  2011  Social History:   reports that she has never smoked. She has never used smokeless tobacco. She reports that she does not drink alcohol or use illicit drugs.  Family History  Problem Relation Age of Onset  . Cancer Sister     Breast  . Hypertension Other   . Diabetes Other     1st degree relative  . Hypertension Maternal Grandmother     Medications: Patient's Medications  New Prescriptions   No medications on file  Previous Medications   ACETAMINOPHEN (TYLENOL) 650 MG CR TABLET    Take 650 mg by mouth as needed for pain.    AMBULATORY NON FORMULARY MEDICATION    Wheelchair and Cushion   ASPIRIN 81 MG EC TABLET    Take 81 mg by mouth daily.     CETIRIZINE (ZYRTEC) 10 MG TABLET    Take 1 tablet (10 mg total) by mouth daily.   GABAPENTIN (NEURONTIN) 100 MG CAPSULE    Take 1 capsule (100 mg total) by mouth 3 (three) times daily.   HYDRALAZINE (APRESOLINE) 50 MG TABLET    Take 1 tablet (50 mg total) by mouth 4 (four) times daily.   MEMANTINE HCL ER 28 MG CP24    Take 28 mg by mouth daily.   MIRTAZAPINE (REMERON) 15 MG TABLET    TAKE 1 TABLET BY MOUTH EVERY DAY AT BEDTIME   NEBIVOLOL (BYSTOLIC) 5 MG TABLET    Take 5 mg by mouth daily.   POLYETHYLENE GLYCOL (MIRALAX / GLYCOLAX) PACKET    Take 17 g by mouth daily as needed (for constipation).   TRAMADOL (ULTRAM) 50 MG TABLET    Take 1 tablet (50 mg total) by mouth 4 (four) times daily as needed for moderate pain.   VITAMIN B-12 (CYANOCOBALAMIN) 1000 MCG TABLET    2 by mouth daily  Modified Medications   No medications on file  Discontinued Medications   DICLOFENAC SODIUM (VOLTAREN) 1 % GEL    Apply 2 g topically 4 (four) times daily. To both knees as needed for arthritis pain     Physical Exam: Filed Vitals:   01/19/15 1117  BP: 138/80  Pulse: 83  Temp: 98.6 F (37 C)  TempSrc: Oral  SpO2: 96%  Physical Exam  Constitutional:  Frail black female seated in wheelchair, unable to stand for weights  Cardiovascular: Normal rate, regular rhythm, normal heart sounds and intact distal pulses.   Pulmonary/Chest: Effort normal and breath sounds normal. No respiratory distress. She has no wheezes. She has no rales.  Abdominal: Soft. Bowel sounds are normal.  Musculoskeletal: She exhibits tenderness.  Of bilateral knees with crepitus on rom  Neurological: She is alert.  Oriented to person only, does not know age  Skin: Skin is warm and dry.  Psychiatric: She has a normal mood and affect.    Labs reviewed: Basic Metabolic Panel:  Recent Labs  12/02/14 0911  NA  144  K 3.9  CL 105  CO2 25  GLUCOSE 91  BUN 8*  CREATININE 0.80  CALCIUM 9.0  TSH 2.210   Liver Function Tests:  Recent Labs  12/02/14 0911  AST 17  ALT 7  ALKPHOS 63  BILITOT 0.4  PROT 6.8   No results for input(s): LIPASE, AMYLASE in the last 8760 hours. No results for input(s): AMMONIA in the last 8760 hours. CBC:  Recent Labs  12/02/14 0911  WBC 6.7  NEUTROABS 3.6  HGB 9.9*  HCT 30.3*  MCV 92   Lipid  Panel:  Recent Labs  12/02/14 0911  CHOL 185  HDL 52  LDLCALC 116*  TRIG 85  CHOLHDL 3.6   Lab Results  Component Value Date   HGBA1C 5.3 10/01/2011    Assessment/Plan 1. Essential hypertension, benign -bp at goal, no changes needed, goal <150/90 due to severe frailty  2. Primary osteoarthritis of both knees -cont use of tramadol, tylenol, otc topical agents as needed for pain, pt inactive  3. Alzheimer's disease -fairly severe, has some failure to thrive -advance directive discussion held for 15 minutes today discussing code status and MOST form--pt herself said full code, but does not know her exact age or where she is -pt's children need to discuss the MOST form and we will complete it fully at next visit -her daughter who was present seems clear that she does not want her life prolonged at this point and prefers quality, but other children not here  4. Severe protein-calorie malnutrition -continue supplement shakes and encouraging more protein rich foods  5. Generalized weakness -due to frailty and end stage dementia, deconditioning  6. Hyperlipidemia -no longer at age where she will benefit from statin therapy  Labs/tests ordered: no new Next appt:  3 months to finish MOST form   Deanne Bedgood L. Bain Whichard, D.O. [facility] [address], [place] [address] Cell Phone (Mon-Fri 8am-5pm):  [phone] On Call:  [phone] & follow prompts after 5pm & weekends Office Phone:   [phone] Office Fax:  [phone]

## 2015-01-22 ENCOUNTER — Other Ambulatory Visit: Payer: Self-pay | Admitting: Internal Medicine

## 2015-01-23 ENCOUNTER — Other Ambulatory Visit: Payer: Self-pay | Admitting: *Deleted

## 2015-01-23 MED ORDER — HYDRALAZINE HCL 50 MG PO TABS
ORAL_TABLET | ORAL | Status: DC
Start: 2015-01-23 — End: 2015-06-08

## 2015-01-23 NOTE — Telephone Encounter (Signed)
Pam, caregiver requested. Faxed to pharmacy

## 2015-04-17 ENCOUNTER — Encounter: Payer: Self-pay | Admitting: Internal Medicine

## 2015-04-17 ENCOUNTER — Ambulatory Visit (INDEPENDENT_AMBULATORY_CARE_PROVIDER_SITE_OTHER): Payer: Medicare Other | Admitting: Internal Medicine

## 2015-04-17 VITALS — BP 128/62 | HR 93 | Temp 98.3°F | Resp 20 | Ht 60.0 in

## 2015-04-17 DIAGNOSIS — M170 Bilateral primary osteoarthritis of knee: Secondary | ICD-10-CM

## 2015-04-17 DIAGNOSIS — Z780 Asymptomatic menopausal state: Secondary | ICD-10-CM | POA: Diagnosis not present

## 2015-04-17 DIAGNOSIS — E785 Hyperlipidemia, unspecified: Secondary | ICD-10-CM

## 2015-04-17 DIAGNOSIS — G309 Alzheimer's disease, unspecified: Secondary | ICD-10-CM | POA: Diagnosis not present

## 2015-04-17 DIAGNOSIS — E538 Deficiency of other specified B group vitamins: Secondary | ICD-10-CM

## 2015-04-17 DIAGNOSIS — E43 Unspecified severe protein-calorie malnutrition: Secondary | ICD-10-CM | POA: Diagnosis not present

## 2015-04-17 DIAGNOSIS — F0280 Dementia in other diseases classified elsewhere without behavioral disturbance: Secondary | ICD-10-CM

## 2015-04-17 DIAGNOSIS — I10 Essential (primary) hypertension: Secondary | ICD-10-CM

## 2015-04-17 DIAGNOSIS — Z23 Encounter for immunization: Secondary | ICD-10-CM | POA: Diagnosis not present

## 2015-04-17 MED ORDER — TETANUS-DIPHTH-ACELL PERTUSSIS 5-2.5-18.5 LF-MCG/0.5 IM SUSP: 0.5000 mL | Freq: Once | INTRAMUSCULAR | Status: DC

## 2015-04-17 NOTE — Progress Notes (Signed)
Patient ID: Elizabeth Peterson, female   DOB: 1918/10/31, 81 y.o.   MRN: [id number]   Location:  [facility]  Code Status: full code Goals of Care: Advanced Directive information Does patient have an advance directive?: No, Would patient like information on creating an advanced directive?: Yes - Educational materials given (MOST form and I was going to complete with pt when she and her daughter came today, but her daughter could not come (works in [place]))   Chief Complaint  Patient presents with  . Medical Management of Chronic Issues    3 month follow-up, Discuss MOST form, has some swelling in both legs and pain    HPI: Patient is a 81 y.o. black female seen in the office today for medical mgt of her chronic diseases.    As always, she c/o bilateral knee pain.  I've tried her on voltaren which was too expensive.  Her sister asks about [place] dragon creame for chronic knee pain.  I'm not family with this topical agent.  Continues with tylenol and tramadol for pain.  Her daughter was going to call me later to follow up on things b/c she lives out of town.      No falls.   Sleeping well at night.  Still losing weight despite eating a lot per his sister.    BLood pressure was at goal today with hydralazine and bystolic.    Has never had bone density test, very frail.  I have recommended it but it has never happened.  She is also not on calcium and vitamin D.  We were going to go over the MOST form today, but [name] could not be here.  Pt's younger sister is here with her today.  Review of Systems:  Review of Systems  Constitutional: Positive for weight loss and malaise/fatigue. Negative for fever and chills.  HENT: Positive for hearing loss. Negative for congestion.   Eyes:       Glasses  Respiratory: Negative for shortness of breath.   Cardiovascular: Negative for chest pain and leg swelling.  Gastrointestinal: Positive for constipation.  Negative for abdominal pain, blood in stool and melena.  Genitourinary: Negative for dysuria.  Musculoskeletal: Positive for joint pain. Negative for falls.  Skin: Negative for rash.  Neurological: Positive for weakness. Negative for dizziness and loss of consciousness.  Psychiatric/Behavioral: Positive for memory loss. Negative for depression. The patient is not nervous/anxious and does not have insomnia.     Past Medical History  Diagnosis Date  . HYPERLIPIDEMIA 04/16/2007  . ANEMIA-NOS 04/16/2007  . ANXIETY 04/16/2007  . DEPRESSION 09/03/2007  . HYPERTENSION 04/16/2007  . ATHEROSLERO NATV ART EXTREM W/INTERMIT CLAUDICAT 06/05/2010  . ALLERGIC RHINITIS 02/27/2009  . PNEUMONIA, COMMUNITY ACQUIRED, PNEUMOCOCCAL 12/07/2008  . ASTHMA 09/03/2007  . GERD 04/16/2007  . DIVERTICULOSIS, COLON 09/03/2007  . MENOPAUSAL DISORDER 04/18/2008  . OSTEOARTHRITIS, KNEE, LEFT 11/24/2009  . JOINT EFFUSION, LEFT KNEE 02/27/2009  . KNEE PAIN, BILATERAL 06/01/2010  . LOW BACK PAIN 04/16/2007  . LEG PAIN, LEFT 06/01/2010  . WEIGHT LOSS 07/25/2008  . RASH-NONVESICULAR 07/25/2008  . Abdominal pain, generalized 09/03/2007  . BREAST CANCER, HX OF 04/16/2007  . COLONIC POLYPS, HX OF 09/03/2007  . Dementia 06/17/2012    mild  . Peripheral neuropathy 06/17/2012    Past Surgical History  Procedure Laterality Date  . Abdominal hysterectomy  1966  . Tubal ligation    . S/p left mastectomy  2011    Allergies  Allergen Reactions  . Amlodipine Besylate     REACTION: rash and itch  . Benzodiazepines Other (See Comments)    Hx of overuse/memory issue  . Penicillins   . Sulfonamide Derivatives    Medications: Patient's Medications  New Prescriptions   TDAP (BOOSTRIX) 5-2.5-18.5 LF-MCG/0.5 INJECTION    Inject 0.5 mLs into the muscle once.  Previous Medications   ACETAMINOPHEN (TYLENOL) 650 MG CR TABLET    Take 650 mg by mouth as needed for pain.   AMBULATORY NON FORMULARY MEDICATION    Wheelchair and Cushion   ASPIRIN  81 MG EC TABLET    Take 81 mg by mouth daily.     CETIRIZINE (ZYRTEC) 10 MG TABLET    Take 1 tablet (10 mg total) by mouth daily.   GABAPENTIN (NEURONTIN) 100 MG CAPSULE    Take 1 capsule (100 mg total) by mouth 3 (three) times daily.   HYDRALAZINE (APRESOLINE) 50 MG TABLET    Take one tablet by mouth four times daily for blood pressure   MEMANTINE HCL ER 28 MG CP24    Take 28 mg by mouth daily.   MIRTAZAPINE (REMERON) 15 MG TABLET    TAKE 1 TABLET BY MOUTH EVERY DAY AT BEDTIME   NEBIVOLOL (BYSTOLIC) 5 MG TABLET    Take 5 mg by mouth daily.   POLYETHYLENE GLYCOL (MIRALAX / GLYCOLAX) PACKET    Take 17 g by mouth daily as needed (for constipation).   TRAMADOL (ULTRAM) 50 MG TABLET    Take 1 tablet (50 mg total) by mouth 4 (four) times daily as needed for moderate pain.   VITAMIN B-12 (CYANOCOBALAMIN) 1000 MCG TABLET    2 by mouth daily  Modified Medications   No medications on file  Discontinued Medications   No medications on file    Physical Exam: Filed Vitals:   04/17/15 1432  BP: 128/62  Pulse: 93  Temp: 98.3 F (36.8 C)  TempSrc: Oral  Resp: 20  Height: 5' (1.524 m)  SpO2: 97%   Physical Exam  Constitutional:  Frail black female seated in wheelchair  Cardiovascular: Normal rate, regular rhythm and intact distal pulses.   Murmur heard. Pulmonary/Chest: Effort normal and breath sounds normal. No respiratory distress.  Abdominal: Soft. Bowel sounds are normal. She exhibits no distension. There is no tenderness.  Musculoskeletal:  Left knee tender and does not extend like right  Neurological: She is alert.  Skin: Skin is warm and dry.  Psychiatric: She has a normal mood and affect.    Labs reviewed: Basic Metabolic Panel:  Recent Labs  12/02/14 0911 04/17/15 1533  NA 144 136  K 3.9 3.9  CL 105 99  CO2 25 20  GLUCOSE 91 101*  BUN 8* 13  CREATININE 0.80 1.14*  CALCIUM 9.0 9.1  TSH 2.210  --    Liver Function Tests:  Recent Labs  12/02/14 0911  AST 17  ALT  7  ALKPHOS 63  BILITOT 0.4  PROT 6.8   No results for input(s): LIPASE, AMYLASE in the last 8760 hours. No results for input(s): AMMONIA in the last 8760 hours. CBC:  Recent Labs  12/02/14 0911 04/17/15 1533  WBC 6.7 8.3  NEUTROABS 3.6 4.5  HGB 9.9*  --   HCT 30.3* 30.3*  MCV 92  --    Lipid Panel:  Recent Labs  12/02/14 0911  CHOL 185  HDL 52  LDLCALC 116*  TRIG 85  CHOLHDL 3.6   Lab Results  Component  Value Date   HGBA1C 5.3 10/01/2011   Assessment/Plan 1. Primary osteoarthritis of both knees -cont tylenol and tramadol for pain -may use otc cream topically -seems to have contracture of left leg  2. Alzheimer's disease -late stage--remains verbal, but gradually losing weight and must be transported with wheelchair due to fall risk and knees -continues on namenda XR  3. Severe protein-calorie malnutrition -encourage supplement shakes and frequent small meals due to weight loss  4. Hyperlipidemia -not on treatment due to LDL being only 116 and her dementia  5. Essential hypertension, benign -bp at goal today with current medications--bystolic and hydralazine  6. Postmenopausal estrogen deficiency - has never had bone density that I can find record of - DG Bone Density; Future  7. Need for vaccination with 13-polyvalent pneumococcal conjugate vaccine -Prevnar given, too  8. Need for Tdap vaccination -Rx given to get [facility]  9.  B12 deficiency--f/u levels today and cbc, bmp  Labs/tests ordered: b12, cbc, bmp Next appt:  4 mos MOST form  Salina Stanfield L. Keny Donald, D.O. [facility] [address], [place] [address] Cell Phone (Mon-Fri 8am-5pm):  [phone] On Call:  [phone] & follow prompts after 5pm & weekends Office Phone:  [phone] Office Fax:  [phone]

## 2015-04-18 ENCOUNTER — Telehealth: Payer: Self-pay | Admitting: *Deleted

## 2015-04-18 ENCOUNTER — Encounter: Payer: Self-pay | Admitting: *Deleted

## 2015-04-18 LAB — BASIC METABOLIC PANEL
BUN/Creatinine Ratio: 11 (ref 11–26)
BUN: 13 mg/dL (ref 10–36)
CO2: 20 mmol/L (ref 18–29)
Calcium: 9.1 mg/dL (ref 8.7–10.3)
Chloride: 99 mmol/L (ref 97–108)
Creatinine, Ser: 1.14 mg/dL — ABNORMAL HIGH (ref 0.57–1.00)
GFR calc Af Amer: 48 mL/min/{1.73_m2} — ABNORMAL LOW (ref >59)
GFR calc non Af Amer: 41 mL/min/{1.73_m2} — ABNORMAL LOW (ref >59)
Glucose: 101 mg/dL — ABNORMAL HIGH (ref 65–99)
Potassium: 3.9 mmol/L (ref 3.5–5.2)
Sodium: 136 mmol/L (ref 134–144)

## 2015-04-18 LAB — CBC WITH DIFFERENTIAL/PLATELET
Basophils Absolute: 0.1 10*3/uL (ref 0.0–0.2)
Basos: 1 %
EOS (ABSOLUTE): 0.3 10*3/uL (ref 0.0–0.4)
Eos: 4 %
Hematocrit: 30.3 % — ABNORMAL LOW (ref 34.0–46.6)
Hemoglobin: 10 g/dL — ABNORMAL LOW (ref 11.1–15.9)
Immature Grans (Abs): 0 10*3/uL (ref 0.0–0.1)
Immature Granulocytes: 0 %
Lymphocytes Absolute: 2.5 10*3/uL (ref 0.7–3.1)
Lymphs: 30 %
MCH: 29.9 pg (ref 26.6–33.0)
MCHC: 33 g/dL (ref 31.5–35.7)
MCV: 91 fL (ref 79–97)
Monocytes Absolute: 0.9 10*3/uL (ref 0.1–0.9)
Monocytes: 10 %
Neutrophils Absolute: 4.5 10*3/uL (ref 1.4–7.0)
Neutrophils: 55 %
Platelets: 353 10*3/uL (ref 150–379)
RBC: 3.34 x10E6/uL — ABNORMAL LOW (ref 3.77–5.28)
RDW: 14.5 % (ref 12.3–15.4)
WBC: 8.3 10*3/uL (ref 3.4–10.8)

## 2015-04-18 LAB — B12 AND FOLATE PANEL
Folate: 6.8 ng/mL (ref >3.0)
Vitamin B-12: 717 pg/mL (ref 211–946)

## 2015-04-18 NOTE — Telephone Encounter (Signed)
Patient caregiver, Elizabeth Peterson called and stated that the daughter,[name] Eberle (in [place]) # [phone]  told her she doesn't want patient to have a bone density due to her age. So instead they want to know if patient should take Calcium, Vit D and vit C daily. Please Advise.

## 2015-04-18 NOTE — Telephone Encounter (Signed)
Ok, cancel bone density.  I think I previously tried to convince them to get this before.   She can take calcium 600mg  with vitamin D 400 units (ie caltrate) twice a day.  No vitamin C is needed.

## 2015-04-18 NOTE — Telephone Encounter (Signed)
Patient daughter, Elvin So Notified and agreed.

## 2015-05-11 ENCOUNTER — Emergency Department (HOSPITAL_COMMUNITY): Payer: Medicare Other

## 2015-05-11 ENCOUNTER — Emergency Department (HOSPITAL_COMMUNITY): Payer: Medicare Other | Admitting: Anesthesiology

## 2015-05-11 ENCOUNTER — Encounter (HOSPITAL_COMMUNITY): Payer: Self-pay | Admitting: Emergency Medicine

## 2015-05-11 ENCOUNTER — Encounter (HOSPITAL_COMMUNITY): Admission: EM | Disposition: A | Discharge status: Home Health | Payer: Self-pay | Source: Home / Self Care

## 2015-05-11 ENCOUNTER — Inpatient Hospital Stay (HOSPITAL_COMMUNITY)
Admission: EM | Admit: 2015-05-11 | Discharge: 2015-05-22 | DRG: 326 | Disposition: A | Discharge status: Home Health | Payer: Medicare Other | Attending: General Surgery | Admitting: General Surgery

## 2015-05-11 DIAGNOSIS — Z515 Encounter for palliative care: Secondary | ICD-10-CM | POA: Insufficient documentation

## 2015-05-11 DIAGNOSIS — Z7982 Long term (current) use of aspirin: Secondary | ICD-10-CM

## 2015-05-11 DIAGNOSIS — J90 Pleural effusion, not elsewhere classified: Secondary | ICD-10-CM | POA: Diagnosis present

## 2015-05-11 DIAGNOSIS — R062 Wheezing: Secondary | ICD-10-CM | POA: Diagnosis not present

## 2015-05-11 DIAGNOSIS — K659 Peritonitis, unspecified: Secondary | ICD-10-CM | POA: Diagnosis present

## 2015-05-11 DIAGNOSIS — J9811 Atelectasis: Secondary | ICD-10-CM | POA: Diagnosis present

## 2015-05-11 DIAGNOSIS — R34 Anuria and oliguria: Secondary | ICD-10-CM | POA: Diagnosis not present

## 2015-05-11 DIAGNOSIS — N179 Acute kidney failure, unspecified: Secondary | ICD-10-CM | POA: Diagnosis not present

## 2015-05-11 DIAGNOSIS — F0280 Dementia in other diseases classified elsewhere without behavioral disturbance: Secondary | ICD-10-CM | POA: Diagnosis present

## 2015-05-11 DIAGNOSIS — K255 Chronic or unspecified gastric ulcer with perforation: Principal | ICD-10-CM | POA: Diagnosis present

## 2015-05-11 DIAGNOSIS — K251 Acute gastric ulcer with perforation: Secondary | ICD-10-CM | POA: Diagnosis not present

## 2015-05-11 DIAGNOSIS — M179 Osteoarthritis of knee, unspecified: Secondary | ICD-10-CM | POA: Diagnosis present

## 2015-05-11 DIAGNOSIS — E876 Hypokalemia: Secondary | ICD-10-CM | POA: Diagnosis not present

## 2015-05-11 DIAGNOSIS — Z888 Allergy status to other drugs, medicaments and biological substances status: Secondary | ICD-10-CM | POA: Diagnosis not present

## 2015-05-11 DIAGNOSIS — F419 Anxiety disorder, unspecified: Secondary | ICD-10-CM | POA: Diagnosis not present

## 2015-05-11 DIAGNOSIS — E46 Unspecified protein-calorie malnutrition: Secondary | ICD-10-CM | POA: Diagnosis not present

## 2015-05-11 DIAGNOSIS — D500 Iron deficiency anemia secondary to blood loss (chronic): Secondary | ICD-10-CM | POA: Diagnosis present

## 2015-05-11 DIAGNOSIS — R0600 Dyspnea, unspecified: Secondary | ICD-10-CM | POA: Diagnosis not present

## 2015-05-11 DIAGNOSIS — F329 Major depressive disorder, single episode, unspecified: Secondary | ICD-10-CM | POA: Diagnosis not present

## 2015-05-11 DIAGNOSIS — I959 Hypotension, unspecified: Secondary | ICD-10-CM | POA: Diagnosis not present

## 2015-05-11 DIAGNOSIS — E162 Hypoglycemia, unspecified: Secondary | ICD-10-CM | POA: Diagnosis not present

## 2015-05-11 DIAGNOSIS — J9690 Respiratory failure, unspecified, unspecified whether with hypoxia or hypercapnia: Secondary | ICD-10-CM

## 2015-05-11 DIAGNOSIS — F32A Depression, unspecified: Secondary | ICD-10-CM | POA: Diagnosis present

## 2015-05-11 DIAGNOSIS — Z853 Personal history of malignant neoplasm of breast: Secondary | ICD-10-CM | POA: Diagnosis not present

## 2015-05-11 DIAGNOSIS — N183 Chronic kidney disease, stage 3 (moderate): Secondary | ICD-10-CM | POA: Diagnosis present

## 2015-05-11 DIAGNOSIS — E43 Unspecified severe protein-calorie malnutrition: Secondary | ICD-10-CM | POA: Diagnosis present

## 2015-05-11 DIAGNOSIS — Z8249 Family history of ischemic heart disease and other diseases of the circulatory system: Secondary | ICD-10-CM

## 2015-05-11 DIAGNOSIS — Z9012 Acquired absence of left breast and nipple: Secondary | ICD-10-CM | POA: Diagnosis not present

## 2015-05-11 DIAGNOSIS — I10 Essential (primary) hypertension: Secondary | ICD-10-CM | POA: Diagnosis not present

## 2015-05-11 DIAGNOSIS — Z882 Allergy status to sulfonamides status: Secondary | ICD-10-CM

## 2015-05-11 DIAGNOSIS — E8770 Fluid overload, unspecified: Secondary | ICD-10-CM | POA: Diagnosis present

## 2015-05-11 DIAGNOSIS — Z90710 Acquired absence of both cervix and uterus: Secondary | ICD-10-CM

## 2015-05-11 DIAGNOSIS — Z23 Encounter for immunization: Secondary | ICD-10-CM | POA: Diagnosis not present

## 2015-05-11 DIAGNOSIS — Z993 Dependence on wheelchair: Secondary | ICD-10-CM | POA: Diagnosis not present

## 2015-05-11 DIAGNOSIS — R198 Other specified symptoms and signs involving the digestive system and abdomen: Secondary | ICD-10-CM

## 2015-05-11 DIAGNOSIS — F418 Other specified anxiety disorders: Secondary | ICD-10-CM | POA: Diagnosis not present

## 2015-05-11 DIAGNOSIS — J690 Pneumonitis due to inhalation of food and vomit: Secondary | ICD-10-CM | POA: Diagnosis present

## 2015-05-11 DIAGNOSIS — Z86010 Personal history of colonic polyps: Secondary | ICD-10-CM | POA: Diagnosis not present

## 2015-05-11 DIAGNOSIS — G629 Polyneuropathy, unspecified: Secondary | ICD-10-CM | POA: Diagnosis not present

## 2015-05-11 DIAGNOSIS — D72829 Elevated white blood cell count, unspecified: Secondary | ICD-10-CM

## 2015-05-11 DIAGNOSIS — D649 Anemia, unspecified: Secondary | ICD-10-CM | POA: Diagnosis present

## 2015-05-11 DIAGNOSIS — Z880 Allergy status to penicillin: Secondary | ICD-10-CM

## 2015-05-11 DIAGNOSIS — E538 Deficiency of other specified B group vitamins: Secondary | ICD-10-CM | POA: Diagnosis not present

## 2015-05-11 DIAGNOSIS — Z833 Family history of diabetes mellitus: Secondary | ICD-10-CM | POA: Diagnosis not present

## 2015-05-11 DIAGNOSIS — R1084 Generalized abdominal pain: Secondary | ICD-10-CM | POA: Diagnosis present

## 2015-05-11 DIAGNOSIS — D62 Acute posthemorrhagic anemia: Secondary | ICD-10-CM | POA: Diagnosis not present

## 2015-05-11 DIAGNOSIS — K219 Gastro-esophageal reflux disease without esophagitis: Secondary | ICD-10-CM | POA: Diagnosis present

## 2015-05-11 DIAGNOSIS — Z79891 Long term (current) use of opiate analgesic: Secondary | ICD-10-CM

## 2015-05-11 DIAGNOSIS — Z79899 Other long term (current) drug therapy: Secondary | ICD-10-CM | POA: Diagnosis not present

## 2015-05-11 DIAGNOSIS — K913 Postprocedural intestinal obstruction: Secondary | ICD-10-CM | POA: Diagnosis not present

## 2015-05-11 DIAGNOSIS — J45909 Unspecified asthma, uncomplicated: Secondary | ICD-10-CM | POA: Diagnosis not present

## 2015-05-11 DIAGNOSIS — Z9889 Other specified postprocedural states: Secondary | ICD-10-CM | POA: Diagnosis not present

## 2015-05-11 DIAGNOSIS — E785 Hyperlipidemia, unspecified: Secondary | ICD-10-CM | POA: Diagnosis present

## 2015-05-11 DIAGNOSIS — K449 Diaphragmatic hernia without obstruction or gangrene: Secondary | ICD-10-CM | POA: Diagnosis present

## 2015-05-11 DIAGNOSIS — G309 Alzheimer's disease, unspecified: Secondary | ICD-10-CM | POA: Diagnosis present

## 2015-05-11 DIAGNOSIS — Z809 Family history of malignant neoplasm, unspecified: Secondary | ICD-10-CM | POA: Diagnosis not present

## 2015-05-11 DIAGNOSIS — J4520 Mild intermittent asthma, uncomplicated: Secondary | ICD-10-CM | POA: Diagnosis not present

## 2015-05-11 DIAGNOSIS — R109 Unspecified abdominal pain: Secondary | ICD-10-CM

## 2015-05-11 DIAGNOSIS — I129 Hypertensive chronic kidney disease with stage 1 through stage 4 chronic kidney disease, or unspecified chronic kidney disease: Secondary | ICD-10-CM | POA: Diagnosis present

## 2015-05-11 DIAGNOSIS — M25569 Pain in unspecified knee: Secondary | ICD-10-CM | POA: Diagnosis present

## 2015-05-11 DIAGNOSIS — R0602 Shortness of breath: Secondary | ICD-10-CM

## 2015-05-11 DIAGNOSIS — J4540 Moderate persistent asthma, uncomplicated: Secondary | ICD-10-CM | POA: Diagnosis not present

## 2015-05-11 HISTORY — PX: LAPAROSCOPY: SHX197

## 2015-05-11 LAB — CBC
HCT: 39.3 % (ref 36.0–46.0)
HEMOGLOBIN: 12.7 g/dL (ref 12.0–15.0)
MCH: 30.2 pg (ref 26.0–34.0)
MCHC: 32.3 g/dL (ref 30.0–36.0)
MCV: 93.3 fL (ref 78.0–100.0)
Platelets: 412 10*3/uL — ABNORMAL HIGH (ref 150–400)
RBC: 4.21 MIL/uL (ref 3.87–5.11)
RDW: 13.6 % (ref 11.5–15.5)
WBC: 5.2 10*3/uL (ref 4.0–10.5)

## 2015-05-11 LAB — COMPREHENSIVE METABOLIC PANEL
ALBUMIN: 3.4 g/dL — AB (ref 3.5–5.0)
ALT: 10 U/L — ABNORMAL LOW (ref 14–54)
ANION GAP: 16 — AB (ref 5–15)
AST: 36 U/L (ref 15–41)
Alkaline Phosphatase: 51 U/L (ref 38–126)
BILIRUBIN TOTAL: 2.3 mg/dL — AB (ref 0.3–1.2)
BUN: 20 mg/dL (ref 6–20)
CO2: 16 mmol/L — ABNORMAL LOW (ref 22–32)
Calcium: 9 mg/dL (ref 8.9–10.3)
Chloride: 107 mmol/L (ref 101–111)
Creatinine, Ser: 1.72 mg/dL — ABNORMAL HIGH (ref 0.44–1.00)
GFR calc Af Amer: 28 mL/min — ABNORMAL LOW (ref >60)
GFR, EST NON AFRICAN AMERICAN: 24 mL/min — AB (ref >60)
Glucose, Bld: 157 mg/dL — ABNORMAL HIGH (ref 65–99)
Potassium: 3.1 mmol/L — ABNORMAL LOW (ref 3.5–5.1)
Sodium: 139 mmol/L (ref 135–145)
TOTAL PROTEIN: 7.3 g/dL (ref 6.5–8.1)

## 2015-05-11 LAB — MRSA PCR SCREENING: MRSA BY PCR: NEGATIVE

## 2015-05-11 LAB — ABO/RH: ABO/RH(D): AB POS

## 2015-05-11 LAB — TYPE AND SCREEN
ABO/RH(D): AB POS
Antibody Screen: NEGATIVE

## 2015-05-11 LAB — LIPASE, BLOOD: Lipase: 441 U/L — ABNORMAL HIGH (ref 22–51)

## 2015-05-11 LAB — I-STAT CG4 LACTIC ACID, ED: Lactic Acid, Venous: 8.31 mmol/L (ref 0.5–2.0)

## 2015-05-11 SURGERY — LAPAROSCOPY, DIAGNOSTIC
Anesthesia: General | Site: Abdomen

## 2015-05-11 MED ORDER — SODIUM CHLORIDE 0.9 % IV SOLN: Freq: Once | INTRAVENOUS | Status: DC

## 2015-05-11 MED ORDER — ONDANSETRON HCL 4 MG/2ML IJ SOLN
INTRAMUSCULAR | Status: AC
Start: 2015-05-11 — End: 2015-05-11
  Filled 2015-05-11: qty 2

## 2015-05-11 MED ORDER — LACTATED RINGERS IV SOLN
INTRAVENOUS | Status: DC | PRN
Start: 2015-05-11 — End: 2015-05-11
  Administered 2015-05-11 (×2): via INTRAVENOUS

## 2015-05-11 MED ORDER — SODIUM CHLORIDE 0.9 % IV SOLN
INTRAVENOUS | Status: DC
  Administered 2015-05-11: 18:00:00 via INTRAVENOUS
  Administered 2015-05-11: 1000 mL via INTRAVENOUS
  Administered 2015-05-12: 03:00:00 via INTRAVENOUS

## 2015-05-11 MED ORDER — LIDOCAINE HCL (CARDIAC) 20 MG/ML IV SOLN
INTRAVENOUS | Status: DC | PRN
  Administered 2015-05-11: 50 mg via INTRAVENOUS

## 2015-05-11 MED ORDER — PROMETHAZINE HCL 25 MG/ML IJ SOLN: 6.2500 mg | INTRAMUSCULAR | Status: DC | PRN

## 2015-05-11 MED ORDER — SODIUM CHLORIDE 0.9 % IJ SOLN
INTRAMUSCULAR | Status: DC | PRN
  Administered 2015-05-11: 20 mL

## 2015-05-11 MED ORDER — SODIUM CHLORIDE 0.9 % IV BOLUS (SEPSIS)
500.0000 mL | Freq: Once | INTRAVENOUS | Status: AC
  Administered 2015-05-11: 500 mL via INTRAVENOUS

## 2015-05-11 MED ORDER — SODIUM CHLORIDE 0.9 % IV BOLUS (SEPSIS): 500.0000 mL | Freq: Once | INTRAVENOUS | Status: DC

## 2015-05-11 MED ORDER — HYDROMORPHONE HCL 1 MG/ML IJ SOLN: 0.2500 mg | INTRAMUSCULAR | Status: DC | PRN

## 2015-05-11 MED ORDER — ONDANSETRON HCL 4 MG/2ML IJ SOLN
4.0000 mg | Freq: Once | INTRAMUSCULAR | Status: AC
  Administered 2015-05-11: 4 mg via INTRAVENOUS
  Filled 2015-05-11: qty 2

## 2015-05-11 MED ORDER — ROCURONIUM BROMIDE 100 MG/10ML IV SOLN
INTRAVENOUS | Status: DC | PRN
  Administered 2015-05-11: 20 mg via INTRAVENOUS

## 2015-05-11 MED ORDER — FENTANYL CITRATE (PF) 250 MCG/5ML IJ SOLN
INTRAMUSCULAR | Status: AC
  Filled 2015-05-11: qty 25

## 2015-05-11 MED ORDER — CIPROFLOXACIN IN D5W 400 MG/200ML IV SOLN
400.0000 mg | Freq: Once | INTRAVENOUS | Status: AC
  Administered 2015-05-11: 400 mg via INTRAVENOUS
  Filled 2015-05-11: qty 200

## 2015-05-11 MED ORDER — SODIUM CHLORIDE 0.9 % IJ SOLN
INTRAMUSCULAR | Status: AC
  Filled 2015-05-11: qty 20

## 2015-05-11 MED ORDER — FENTANYL CITRATE (PF) 100 MCG/2ML IJ SOLN
50.0000 ug | Freq: Once | INTRAMUSCULAR | Status: AC
Start: 2015-05-11 — End: 2015-05-11
  Administered 2015-05-11: 50 ug via INTRAVENOUS
  Filled 2015-05-11: qty 2

## 2015-05-11 MED ORDER — CIPROFLOXACIN IN D5W 400 MG/200ML IV SOLN: 400.0000 mg | Freq: Two times a day (BID) | INTRAVENOUS | Status: DC

## 2015-05-11 MED ORDER — ONDANSETRON HCL 4 MG/2ML IJ SOLN
4.0000 mg | Freq: Four times a day (QID) | INTRAMUSCULAR | Status: DC | PRN
  Administered 2015-05-17: 4 mg via INTRAVENOUS
  Filled 2015-05-11: qty 2

## 2015-05-11 MED ORDER — ETOMIDATE 2 MG/ML IV SOLN
INTRAVENOUS | Status: DC | PRN
  Administered 2015-05-11: 12 mg via INTRAVENOUS

## 2015-05-11 MED ORDER — CETYLPYRIDINIUM CHLORIDE 0.05 % MT LIQD
7.0000 mL | Freq: Two times a day (BID) | OROMUCOSAL | Status: DC
  Administered 2015-05-11 – 2015-05-22 (×18): 7 mL via OROMUCOSAL

## 2015-05-11 MED ORDER — FENTANYL CITRATE (PF) 100 MCG/2ML IJ SOLN
25.0000 ug | INTRAMUSCULAR | Status: DC | PRN
  Administered 2015-05-11 – 2015-05-22 (×32): 25 ug via INTRAVENOUS
  Filled 2015-05-11 (×32): qty 2

## 2015-05-11 MED ORDER — SUCCINYLCHOLINE CHLORIDE 20 MG/ML IJ SOLN
INTRAMUSCULAR | Status: DC | PRN
  Administered 2015-05-11: 100 mg via INTRAVENOUS

## 2015-05-11 MED ORDER — SUGAMMADEX SODIUM 200 MG/2ML IV SOLN
INTRAVENOUS | Status: AC
  Filled 2015-05-11: qty 2

## 2015-05-11 MED ORDER — METRONIDAZOLE IN NACL 5-0.79 MG/ML-% IV SOLN
500.0000 mg | Freq: Once | INTRAVENOUS | Status: AC
  Administered 2015-05-11: 500 mg via INTRAVENOUS
  Filled 2015-05-11: qty 100

## 2015-05-11 MED ORDER — ONDANSETRON 4 MG PO TBDP
4.0000 mg | ORAL_TABLET | Freq: Four times a day (QID) | ORAL | Status: DC | PRN
  Filled 2015-05-11: qty 1

## 2015-05-11 MED ORDER — FENTANYL CITRATE (PF) 100 MCG/2ML IJ SOLN
INTRAMUSCULAR | Status: DC | PRN
  Administered 2015-05-11 (×2): 50 ug via INTRAVENOUS

## 2015-05-11 MED ORDER — HEPARIN SODIUM (PORCINE) 5000 UNIT/ML IJ SOLN
5000.0000 [IU] | Freq: Three times a day (TID) | INTRAMUSCULAR | Status: DC
  Administered 2015-05-11 – 2015-05-22 (×34): 5000 [IU] via SUBCUTANEOUS
  Filled 2015-05-11 (×38): qty 1

## 2015-05-11 MED ORDER — CHLORHEXIDINE GLUCONATE 0.12 % MT SOLN
15.0000 mL | Freq: Two times a day (BID) | OROMUCOSAL | Status: DC
  Administered 2015-05-11 – 2015-05-22 (×16): 15 mL via OROMUCOSAL
  Filled 2015-05-11 (×23): qty 15

## 2015-05-11 MED ORDER — PHENYLEPHRINE HCL 10 MG/ML IJ SOLN
10.0000 mg | INTRAVENOUS | Status: DC | PRN
  Administered 2015-05-11: 25 ug/min via INTRAVENOUS

## 2015-05-11 MED ORDER — CIPROFLOXACIN IN D5W 400 MG/200ML IV SOLN
400.0000 mg | INTRAVENOUS | Status: AC
  Administered 2015-05-12: 400 mg via INTRAVENOUS
  Filled 2015-05-11: qty 200

## 2015-05-11 MED ORDER — METRONIDAZOLE IN NACL 5-0.79 MG/ML-% IV SOLN
500.0000 mg | Freq: Three times a day (TID) | INTRAVENOUS | Status: AC
Start: 2015-05-11 — End: 2015-05-11
  Administered 2015-05-11: 500 mg via INTRAVENOUS
  Filled 2015-05-11: qty 100

## 2015-05-11 MED ORDER — SODIUM CHLORIDE 0.9 % IV SOLN
80.0000 mg | Freq: Two times a day (BID) | INTRAVENOUS | Status: DC
  Administered 2015-05-11 – 2015-05-22 (×22): 80 mg via INTRAVENOUS
  Filled 2015-05-11 (×32): qty 80

## 2015-05-11 MED ORDER — 0.9 % SODIUM CHLORIDE (POUR BTL) OPTIME
TOPICAL | Status: DC | PRN
  Administered 2015-05-11: 6000 mL
  Administered 2015-05-11 (×2): 1000 mL

## 2015-05-11 MED ORDER — EPHEDRINE SULFATE 50 MG/ML IJ SOLN
INTRAMUSCULAR | Status: AC
  Filled 2015-05-11: qty 1

## 2015-05-11 MED ORDER — LIDOCAINE HCL (CARDIAC) 20 MG/ML IV SOLN
INTRAVENOUS | Status: AC
  Filled 2015-05-11: qty 10

## 2015-05-11 MED ORDER — PANTOPRAZOLE SODIUM 40 MG IV SOLR: 40.0000 mg | INTRAVENOUS | Status: DC

## 2015-05-11 MED ORDER — SUGAMMADEX SODIUM 200 MG/2ML IV SOLN
INTRAVENOUS | Status: DC | PRN
  Administered 2015-05-11: 200 mg via INTRAVENOUS

## 2015-05-11 MED ORDER — SODIUM CHLORIDE 0.9 % IJ SOLN
INTRAMUSCULAR | Status: AC
  Filled 2015-05-11: qty 50

## 2015-05-11 MED ORDER — BUPIVACAINE LIPOSOME 1.3 % IJ SUSP
20.0000 mL | Freq: Once | INTRAMUSCULAR | Status: AC
Start: 2015-05-11 — End: 2015-05-11
  Administered 2015-05-11: 20 mL
  Filled 2015-05-11: qty 20

## 2015-05-11 MED ORDER — ONDANSETRON HCL 4 MG/2ML IJ SOLN
INTRAMUSCULAR | Status: DC | PRN
Start: 2015-05-11 — End: 2015-05-11
  Administered 2015-05-11: 4 mg via INTRAVENOUS

## 2015-05-11 MED ORDER — IOHEXOL 300 MG/ML  SOLN
50.0000 mL | Freq: Once | INTRAMUSCULAR | Status: AC | PRN
  Administered 2015-05-11: 50 mL via ORAL

## 2015-05-11 MED ORDER — PROPOFOL 10 MG/ML IV BOLUS
INTRAVENOUS | Status: AC
  Filled 2015-05-11: qty 20

## 2015-05-11 MED ORDER — PHENYLEPHRINE HCL 10 MG/ML IJ SOLN
INTRAMUSCULAR | Status: AC
  Filled 2015-05-11: qty 1

## 2015-05-11 SURGICAL SUPPLY — 67 items
APPLIER CLIP 5 13 M/L LIGAMAX5 (MISCELLANEOUS)
APPLIER CLIP ROT 10 11.4 M/L (STAPLE)
APR CLP MED LRG 11.4X10 (STAPLE)
APR CLP MED LRG 5 ANG JAW (MISCELLANEOUS)
BLADE EXTENDED COATED 6.5IN (ELECTRODE) IMPLANT
BLADE HEX COATED 2.75 (ELECTRODE) IMPLANT
BLADE SURG SZ10 CARB STEEL (BLADE) IMPLANT
BNDG GAUZE ELAST 4 BULKY (GAUZE/BANDAGES/DRESSINGS) ×2 IMPLANT
CLIP APPLIE 5 13 M/L LIGAMAX5 (MISCELLANEOUS) IMPLANT
CLIP APPLIE ROT 10 11.4 M/L (STAPLE) IMPLANT
CLOSURE WOUND 1/2 X4 (GAUZE/BANDAGES/DRESSINGS)
COVER MAYO STAND STRL (DRAPES) ×3 IMPLANT
COVER SURGICAL LIGHT HANDLE (MISCELLANEOUS) ×3 IMPLANT
DECANTER SPIKE VIAL GLASS SM (MISCELLANEOUS) ×3 IMPLANT
DRAPE LAPAROSCOPIC ABDOMINAL (DRAPES) ×3 IMPLANT
DRAPE WARM FLUID 44X44 (DRAPE) IMPLANT
DRSG PAD ABDOMINAL 8X10 ST (GAUZE/BANDAGES/DRESSINGS) ×2 IMPLANT
ELECT PENCIL ROCKER SW 15FT (MISCELLANEOUS) IMPLANT
ELECT REM PT RETURN 9FT ADLT (ELECTROSURGICAL) ×3
ELECTRODE REM PT RTRN 9FT ADLT (ELECTROSURGICAL) ×1 IMPLANT
FILTER SMOKE EVAC LAPAROSHD (FILTER) IMPLANT
GAUZE SPONGE 4X4 12PLY STRL (GAUZE/BANDAGES/DRESSINGS) ×3 IMPLANT
GLOVE BIO SURGEON STRL SZ7 (GLOVE) ×3 IMPLANT
GLOVE BIO SURGEON STRL SZ7.5 (GLOVE) ×3 IMPLANT
GLOVE BIOGEL M 7.0 STRL (GLOVE) ×3 IMPLANT
GLOVE BIOGEL M STRL SZ7.5 (GLOVE) IMPLANT
GLOVE BIOGEL PI IND STRL 7.0 (GLOVE) ×1 IMPLANT
GLOVE BIOGEL PI INDICATOR 7.0 (GLOVE) ×2
GLOVE INDICATOR 8.0 STRL GRN (GLOVE) ×3 IMPLANT
GOWN STRL REUS W/ TWL XL LVL3 (GOWN DISPOSABLE) ×1 IMPLANT
GOWN STRL REUS W/TWL LRG LVL3 (GOWN DISPOSABLE) ×6 IMPLANT
GOWN STRL REUS W/TWL XL LVL3 (GOWN DISPOSABLE) ×3
KIT BASIN OR (CUSTOM PROCEDURE TRAY) ×3 IMPLANT
LIGASURE IMPACT 36 18CM CVD LR (INSTRUMENTS) IMPLANT
LIQUID BAND (GAUZE/BANDAGES/DRESSINGS) IMPLANT
NS IRRIG 1000ML POUR BTL (IV SOLUTION) ×3 IMPLANT
SET IRRIG TUBING LAPAROSCOPIC (IRRIGATION / IRRIGATOR) IMPLANT
SHEARS HARMONIC ACE PLUS 36CM (ENDOMECHANICALS) IMPLANT
SOLUTION ANTI FOG 6CC (MISCELLANEOUS) ×3 IMPLANT
SPONGE LAP 18X18 X RAY DECT (DISPOSABLE) IMPLANT
STAPLER VISISTAT 35W (STAPLE) IMPLANT
STRIP CLOSURE SKIN 1/2X4 (GAUZE/BANDAGES/DRESSINGS) IMPLANT
SUCTION POOLE TIP (SUCTIONS) IMPLANT
SUT PDS AB 1 TP1 96 (SUTURE) IMPLANT
SUT PROLENE 2 0 KS (SUTURE) IMPLANT
SUT PROLENE 2 0 SH DA (SUTURE) IMPLANT
SUT SILK 2 0 (SUTURE)
SUT SILK 2 0 SH CR/8 (SUTURE) ×2 IMPLANT
SUT SILK 2-0 18XBRD TIE 12 (SUTURE) IMPLANT
SUT SILK 3 0 (SUTURE)
SUT SILK 3 0 SH CR/8 (SUTURE) IMPLANT
SUT SILK 3-0 18XBRD TIE 12 (SUTURE) IMPLANT
SYS LAPSCP GELPORT 120MM (MISCELLANEOUS)
SYSTEM LAPSCP GELPORT 120MM (MISCELLANEOUS) IMPLANT
TAPE CLOTH SURG 6X10 WHT LF (GAUZE/BANDAGES/DRESSINGS) ×3 IMPLANT
TOWEL OR 17X26 10 PK STRL BLUE (TOWEL DISPOSABLE) ×3 IMPLANT
TRAY FOLEY W/METER SILVER 14FR (SET/KITS/TRAYS/PACK) ×3 IMPLANT
TRAY FOLEY W/METER SILVER 16FR (SET/KITS/TRAYS/PACK) ×3 IMPLANT
TRAY LAPAROSCOPIC (CUSTOM PROCEDURE TRAY) ×3 IMPLANT
TROCAR BLADELESS OPT 5 75 (ENDOMECHANICALS) ×3 IMPLANT
TROCAR XCEL 12X100 BLDLESS (ENDOMECHANICALS) IMPLANT
TROCAR XCEL BLUNT TIP 100MML (ENDOMECHANICALS) IMPLANT
TROCAR XCEL NON-BLD 11X100MML (ENDOMECHANICALS) IMPLANT
TROCAR XCEL UNIV SLVE 11M 100M (ENDOMECHANICALS) IMPLANT
TUBING INSUFFLATION 10FT LAP (TUBING) ×3 IMPLANT
YANKAUER SUCT BULB TIP 10FT TU (MISCELLANEOUS) IMPLANT
YANKAUER SUCT BULB TIP NO VENT (SUCTIONS) IMPLANT

## 2015-05-11 NOTE — Progress Notes (Signed)
eLink Physician-Brief Progress Note Patient Name: Elizabeth Peterson DOB: 20-Mar-1919 MRN: [id number]   Date of Service  05/11/2015  HPI/Events of Note  Oliguria.  eICU Interventions  Bolus with 0.9 NaCl 500 mL IV over 30 minutes now.      Intervention Category Intermediate Interventions: Oliguria - evaluation and management  Elizabeth Peterson 05/11/2015, 3:36 PM

## 2015-05-11 NOTE — Transfer of Care (Signed)
Immediate Anesthesia Transfer of Care Note  Patient: Elizabeth Peterson  Procedure(s) Performed: Procedure(s): LAPAROSCOPY DIAGNOSTIC converted open repair gastric ulcerand omental patch (N/A)  Patient Location: PACU  Anesthesia Type:General  Level of Consciousness:  sedated, patient cooperative and responds to stimulation  Airway & Oxygen Therapy:Patient Spontanous Breathing and Patient connected to face mask oxgen  Post-op Assessment:  Report given to PACU RN and Post -op Vital signs reviewed and stable  Post vital signs:  Reviewed and stable  Last Vitals:  Filed Vitals:   05/11/15 0527  BP: 152/53  Pulse: 93  Temp:   Resp: 20    Complications: No apparent anesthesia complications

## 2015-05-11 NOTE — ED Notes (Signed)
Ciprofloxacin bag given to CRNA upon patient entering to OR. All belongings sent home with sister including 2 yellow earrings, 2 rings, two shirts and one pair of capris.

## 2015-05-11 NOTE — ED Notes (Signed)
Bed: WA04 Expected date:  Expected time:  Means of arrival:  Comments: EMS 

## 2015-05-11 NOTE — Consult Note (Signed)
PULMONARY / CRITICAL CARE MEDICINE   Name: Elizabeth Peterson MRN: [id number] DOB: 02-17-1919    ADMISSION DATE:  05/11/2015 CONSULTATION DATE: 8/18  REFERRING MD : CCS  CHIEF COMPLAINT:  Perfed gastric ulcer  INITIAL PRESENTATION: Abd pain  STUDIES:    SIGNIFICANT EVENTS: 8/18 p lap   HISTORY OF PRESENT ILLNESS:   Post exp lap  PAST MEDICAL HISTORY :   has a past medical history of HYPERLIPIDEMIA (04/16/2007); ANEMIA-NOS (04/16/2007); ANXIETY (04/16/2007); DEPRESSION (09/03/2007); HYPERTENSION (04/16/2007); ATHEROSLERO NATV ART EXTREM W/INTERMIT CLAUDICAT (06/05/2010); ALLERGIC RHINITIS (02/27/2009); PNEUMONIA, COMMUNITY ACQUIRED, PNEUMOCOCCAL (12/07/2008); ASTHMA (09/03/2007); GERD (04/16/2007); DIVERTICULOSIS, COLON (09/03/2007); MENOPAUSAL DISORDER (04/18/2008); OSTEOARTHRITIS, KNEE, LEFT (11/24/2009); JOINT EFFUSION, LEFT KNEE (02/27/2009); KNEE PAIN, BILATERAL (06/01/2010); LOW BACK PAIN (04/16/2007); LEG PAIN, LEFT (06/01/2010); WEIGHT LOSS (07/25/2008); RASH-NONVESICULAR (07/25/2008); Abdominal pain, generalized (09/03/2007); BREAST CANCER, HX OF (04/16/2007); COLONIC POLYPS, HX OF (09/03/2007); Dementia (06/17/2012); and Peripheral neuropathy (06/17/2012).  has past surgical history that includes Abdominal hysterectomy (1966); Tubal ligation; and s/p left mastectomy (2011). Prior to Admission medications   Medication Sig Start Date End Date Taking? Authorizing Provider  acetaminophen (TYLENOL) 650 MG CR tablet Take 650 mg by mouth as needed for pain.   Yes Historical Provider, MD  aspirin 81 MG EC tablet Take 81 mg by mouth daily.     Yes Historical Provider, MD  gabapentin (NEURONTIN) 100 MG capsule Take 1 capsule (100 mg total) by mouth 3 (three) times daily. 06/17/12  Yes Biagio Borg, MD  hydrALAZINE (APRESOLINE) 50 MG tablet Take one tablet by mouth four times daily for blood pressure 01/23/15  Yes Tiffany L Reed, DO  Memantine HCl ER 28 MG CP24 Take 28 mg by mouth daily. 04/18/14  Yes Tiffany L Reed,  DO  naproxen sodium (ANAPROX) 220 MG tablet Take 440 mg by mouth 2 (two) times daily as needed (pain).   Yes Historical Provider, MD  vitamin B-12 (CYANOCOBALAMIN) 1000 MCG tablet 2 by mouth daily   Yes Historical Provider, MD  AMBULATORY NON FORMULARY MEDICATION Wheelchair and Cushion 10/18/14   Tiffany L Reed, DO  cetirizine (ZYRTEC) 10 MG tablet Take 1 tablet (10 mg total) by mouth daily. 06/02/14   Tiffany L Reed, DO  mirtazapine (REMERON) 15 MG tablet TAKE 1 TABLET BY MOUTH EVERY DAY AT BEDTIME Patient not taking: Reported on 05/11/2015 04/18/14   Tiffany L Reed, DO  Tdap (BOOSTRIX) 5-2.5-18.5 LF-MCG/0.5 injection Inject 0.5 mLs into the muscle once. 04/17/15   Tiffany L Reed, DO  traMADol (ULTRAM) 50 MG tablet Take 1 tablet (50 mg total) by mouth 4 (four) times daily as needed for moderate pain. 09/01/14   Tiffany L Reed, DO   Allergies  Allergen Reactions  . Amlodipine Besylate     REACTION: rash and itch  . Benzodiazepines Other (See Comments)    Hx of overuse/memory issue  . Penicillins   . Sulfonamide Derivatives     FAMILY HISTORY:  indicated that her mother is deceased. She indicated that her father is deceased. She indicated that only one of her two sisters is alive. She indicated that both of her brothers are deceased. She indicated that three of her five daughters are alive. She indicated that her son is alive.  SOCIAL HISTORY:  reports that she has never smoked. She has never used smokeless tobacco. She reports that she does not drink alcohol or use illicit drugs.  REVIEW OF SYSTEMS: NA  SUBJECTIVE:   VITAL SIGNS: Temp:  [97.1 F (36.2 C)-97.5 F (36.4  C)] 97.5 F (36.4 C) (08/18 0930) Pulse Rate:  [71-98] 91 (08/18 0930) Resp:  [20-35] 23 (08/18 0930) BP: (96-152)/(51-81) 96/53 mmHg (08/18 0930) SpO2:  [97 %-100 %] 97 % (08/18 0930) HEMODYNAMICS:   VENTILATOR SETTINGS:   INTAKE / OUTPUT:  Intake/Output Summary (Last 24 hours) at 05/11/15 1005 Last data filed  at 05/11/15 0930  Gross per 24 hour  Intake   2600 ml  Output    355 ml  Net   2245 ml    PHYSICAL EXAMINATION: General:  Frail elderly AAF confused but responsive Neuro:  Follows some commands HEENT:  Edentulous, oral mucosa moist, mild jvd  Cardiovascular:  HSR RRR Lungs: Decreased air movement Abdomen:  No BS Dressing CDI, ngt with bloody drainage     Musculoskeletal:  Contractures of lower ext Skin:  Warm and dry  LABS:  CBC  Recent Labs Lab 05/11/15 0305  WBC 5.2  HGB 12.7  HCT 39.3  PLT 412*   Coag's No results for input(s): APTT, INR in the last 168 hours. BMET  Recent Labs Lab 05/11/15 0305  NA 139  K 3.1*  CL 107  CO2 16*  BUN 20  CREATININE 1.72*  GLUCOSE 157*   Electrolytes  Recent Labs Lab 05/11/15 0305  CALCIUM 9.0   Sepsis Markers  Recent Labs Lab 05/11/15 0323  LATICACIDVEN 8.31*   ABG No results for input(s): PHART, PCO2ART, PO2ART in the last 168 hours. Liver Enzymes  Recent Labs Lab 05/11/15 0305  AST 36  ALT 10*  ALKPHOS 51  BILITOT 2.3*  ALBUMIN 3.4*   Cardiac Enzymes No results for input(s): TROPONINI, PROBNP in the last 168 hours. Glucose No results for input(s): GLUCAP in the last 168 hours.  Imaging Ct Abdomen Pelvis Wo Contrast  05/11/2015   CLINICAL DATA:  Generalized abdominal pain, worse last night.  EXAM: CT ABDOMEN AND PELVIS WITHOUT CONTRAST  TECHNIQUE: Multidetector CT imaging of the abdomen and pelvis was performed following the standard protocol without IV contrast.  COMPARISON:  None.  FINDINGS: BODY WALL: No contributory findings.  LOWER CHEST: Small bilateral pleural effusion with right basilar atelectasis. Small lower pneumomediastinum through hiatal hernia.  ABDOMEN/PELVIS:  Liver: Few sub cm low densities favor cysts.  Biliary: Haziness of fat around the gallbladder is likely secondary. No primary inflammation suspected.  Pancreas: Unremarkable.  Spleen: Unremarkable.  Adrenals: Unremarkable.   Kidneys and ureters: Mild bilateral renal cortical atrophy. No hydronephrosis.  Bladder: Distended without wall thickening.  Reproductive: Hysterectomy.  No adnexal mass.  Bowel: There is a large volume of pneumoperitoneum distributed throughout the abdomen. Given the large volume, distribution is not helpful in identifying a source. Small volume ascites accompanies the fluid, with probable peritoneal thickening in the pelvic recesses. The source of perforation is uncertain; there is no definitive pneumatosis or ulceration. No inflammatory wall thickening around the gastric antrum or duodenum. The cecum is gas filled and ventrally rotated, but there is no bascule or volvulus and no associated pneumatosis. Oral contrast reaches the third portion duodenum and there is no contrast extravasation.  Vascular: No acute abnormality.  OSSEOUS: No acute abnormalities.  These results were called by telephone at the time of interpretation on 05/11/2015 at 5:02 am to Dr. Julianne Rice , who verbally acknowledged these results.  IMPRESSION: 1. Perforated bowel with large pneumoperitoneum and small ascites. The site of perforation is uncertain; no extravasation of oral contrast which reaches the third portion duodenum. Colonic diverticulosis is present. 2. Atelectasis and small  bilateral pleural effusion.   Electronically Signed   By: Monte Fantasia M.D.   On: 05/11/2015 05:07   Dg Chest Port 1 View  05/11/2015   CLINICAL DATA:  Hypoxia  EXAM: PORTABLE CHEST - 1 VIEW  COMPARISON:  02/28/2013  FINDINGS: Normal heart size and aortic contours.  Atelectasis in the right upper lobe seen on the previous study has resolved. There is an indistinct opacity at the right base obscuring the diaphragm which is elevated compared to prior. No edema, effusion, or air leak.  IMPRESSION: Hazy opacity at the right base favoring atelectasis. This area should be seen on abdominal CT scheduled for later today.   Electronically Signed   By:  Monte Fantasia M.D.   On: 05/11/2015 03:41     ASSESSMENT / PLAN:  PULMONARY OETT A: No acute issue Monitor for hypoxia  P:   O2 as needed IS Pulmonary toilet  CARDIOVASCULAR CVL A:  Hx of HTN P:  Hold antihypertensive for now Gentle hydration  RENAL Lab Results  Component Value Date   CREATININE 1.72* 05/11/2015   CREATININE 1.14* 04/17/2015   CREATININE 0.80 12/02/2014    A:  Concern for renal injury in 81 yo  post ct scan, on NSAIDS P:   Hydration Follow creatine  GASTROINTESTINAL A:  8/18 post open repair perforated prepyloric ulcer P:   Per CCS Consider PPI  HEMATOLOGIC  Recent Labs  05/11/15 0305  HGB 12.7    A:  Concern for blood loss anemia P:  Follow cbc  INFECTIOUS A:   Concern for peritonitis  P:    Abx:  8/18 cipro x 1 8/18 flagyl x 1  ENDOCRINE A:   No acute issue P:     NEUROLOGIC A:  Dementia P:   RASS goal: 1 Safety precautions   FAMILY  - Updates: Updated at bedside.  - Inter-disciplinary family meet or Palliative Care meeting due by:  day 7    TODAY'S SUMMARY:  81 yo AAF who is wheelchair bound due to OA,  Takes NSIADS for pain. suffers from dementia and HTN and presents to [facility] 8[facility]18 with increased abd pain for several weeks.  Ct of abd revealed perforation and she was taken urgently to OR by CCS. She had open repair of perforation and returned to ICU stable and awake. Due to her advanced age 56 asked PCCM to help with her care.   Richardson Landry Minor ACNP Maryanna Shape PCCM Pager [phone] till 3 pm If no answer page [phone] 05/11/2015, 10:06 AM

## 2015-05-11 NOTE — ED Notes (Signed)
Pt is from home and started having RUQ abdominal pain today and N/V tonight after eating. Alert per norm to self and place.

## 2015-05-11 NOTE — ED Notes (Signed)
Pt and daughter aware that a urine sample is needed. Pt unable to urinate at this time.

## 2015-05-11 NOTE — Care Management Note (Signed)
Case Management Note  Patient Details  Name: Ruthie Berch MRN: [id number] Date of Birth: 1919-06-06  Subjective/Objective:                Perforated bowel and surgical repair    Action/Plan: Date:  May 11, 2015 U.R. performed for needs and level of care. Will continue to follow for Case Management needs.  Velva Harman, RN, BSN, [place]   [phone]  Expected Discharge Date:                  Expected Discharge Plan:  Skilled Nursing Facility  In-House Referral:  Clinical Social Work  Discharge planning Services  CM Consult  Post Acute Care Choice:  NA Choice offered to:  NA  DME Arranged:    DME Agency:     HH Arranged:    [place] Agency:     Status of Service:  In process, will continue to follow  Medicare Important Message Given:    Date Medicare IM Given:    Medicare IM give by:    Date Additional Medicare IM Given:    Additional Medicare Important Message give by:     If discussed at [place] of Stay Meetings, dates discussed:    Additional Comments:  Leeroy Cha, RN 05/11/2015, 1:14 PM

## 2015-05-11 NOTE — Anesthesia Procedure Notes (Signed)
Procedure Name: Intubation Date/Time: 05/11/2015 6:48 AM Performed by: Rifky Lapre, Virgel Gess Pre-anesthesia Checklist: Patient identified, Emergency Drugs available, Suction available, Patient being monitored and Timeout performed Patient Re-evaluated:Patient Re-evaluated prior to inductionOxygen Delivery Method: Circle system utilized Preoxygenation: Pre-oxygenation with 100% oxygen Intubation Type: IV induction and Rapid sequence Laryngoscope Size: Mac and 3 Grade View: Grade I Tube type: Subglottic suction tube Tube size: 7.0 mm Number of attempts: 1 Airway Equipment and Method: Stylet Placement Confirmation: ETT inserted through vocal cords under direct vision,  positive ETCO2,  CO2 detector and breath sounds checked- equal and bilateral Secured at: 20 cm Tube secured with: Tape Dental Injury: Teeth and Oropharynx as per pre-operative assessment

## 2015-05-11 NOTE — Progress Notes (Signed)
PACU note; anesthesiologist notified of decreased urine output; OK to give fluid bolus

## 2015-05-11 NOTE — ED Provider Notes (Signed)
CSN: [id number]     Arrival date & time 05/11/15  0202 History  This chart was scribed for Julianne Rice, MD by Hansel Feinstein, ED Scribe. This patient was seen in room WA04/WA04 and the patient's care was started at 2:27 AM.     Chief Complaint  Patient presents with  . Abdominal Pain   The history is provided by the patient. No language interpreter was used.    HPI Comments: Elizabeth Peterson is a 81 y.o. female with Hx of HLD, anemia, anxiety, depression, HTN, asthma, GERD, diverticulosis, dementia, peripheral neuropathy who presents to the Emergency Department complaining of moderate generalized abdominal pain onset several weeks ago and worsened yesterday evening around dinner time. She states associated nausea, vomiting onset today. Pt states pain is not exacerbated with eating. Last BM was normal yesterday. No Hx of abdominal surgery. She denies diarrhea, fever. Patient has history of dementia and is a difficult historian.  Past Medical History  Diagnosis Date  . HYPERLIPIDEMIA 04/16/2007  . ANEMIA-NOS 04/16/2007  . ANXIETY 04/16/2007  . DEPRESSION 09/03/2007  . HYPERTENSION 04/16/2007  . ATHEROSLERO NATV ART EXTREM W/INTERMIT CLAUDICAT 06/05/2010  . ALLERGIC RHINITIS 02/27/2009  . PNEUMONIA, COMMUNITY ACQUIRED, PNEUMOCOCCAL 12/07/2008  . ASTHMA 09/03/2007  . GERD 04/16/2007  . DIVERTICULOSIS, COLON 09/03/2007  . MENOPAUSAL DISORDER 04/18/2008  . OSTEOARTHRITIS, KNEE, LEFT 11/24/2009  . JOINT EFFUSION, LEFT KNEE 02/27/2009  . KNEE PAIN, BILATERAL 06/01/2010  . LOW BACK PAIN 04/16/2007  . LEG PAIN, LEFT 06/01/2010  . WEIGHT LOSS 07/25/2008  . RASH-NONVESICULAR 07/25/2008  . Abdominal pain, generalized 09/03/2007  . BREAST CANCER, HX OF 04/16/2007  . COLONIC POLYPS, HX OF 09/03/2007  . Dementia 06/17/2012    mild  . Peripheral neuropathy 06/17/2012   Past Surgical History  Procedure Laterality Date  . Abdominal hysterectomy  1966  . Tubal ligation    . S/p left mastectomy  2011   Family  History  Problem Relation Age of Onset  . Cancer Sister     Breast  . Hypertension Other   . Diabetes Other     1st degree relative  . Hypertension Maternal Grandmother    Social History  Substance Use Topics  . Smoking status: Never Smoker   . Smokeless tobacco: Never Used  . Alcohol Use: No   OB History    No data available     Review of Systems  Constitutional: Negative for fever and chills.  Respiratory: Negative for cough and shortness of breath.   Cardiovascular: Negative for chest pain.  Gastrointestinal: Positive for nausea, vomiting and abdominal pain. Negative for diarrhea and constipation.  Genitourinary: Negative for dysuria and flank pain.  Musculoskeletal: Negative for back pain.  Neurological: Negative for dizziness, weakness, light-headedness, numbness and headaches.  All other systems reviewed and are negative.   Allergies  Amlodipine besylate; Benzodiazepines; Penicillins; and Sulfonamide derivatives  Home Medications   Prior to Admission medications   Medication Sig Start Date End Date Taking? Authorizing Provider  acetaminophen (TYLENOL) 650 MG CR tablet Take 650 mg by mouth as needed for pain.   Yes Historical Provider, MD  aspirin 81 MG EC tablet Take 81 mg by mouth daily.     Yes Historical Provider, MD  gabapentin (NEURONTIN) 100 MG capsule Take 1 capsule (100 mg total) by mouth 3 (three) times daily. 06/17/12  Yes Biagio Borg, MD  hydrALAZINE (APRESOLINE) 50 MG tablet Take one tablet by mouth four times daily for blood pressure 01/23/15  Yes Tiffany  L Reed, DO  Memantine HCl ER 28 MG CP24 Take 28 mg by mouth daily. 04/18/14  Yes Tiffany L Reed, DO  naproxen sodium (ANAPROX) 220 MG tablet Take 440 mg by mouth 2 (two) times daily as needed (pain).   Yes Historical Provider, MD  vitamin B-12 (CYANOCOBALAMIN) 1000 MCG tablet 2 by mouth daily   Yes Historical Provider, MD  AMBULATORY NON FORMULARY MEDICATION Wheelchair and Cushion 10/18/14   Tiffany L Reed,  DO  cetirizine (ZYRTEC) 10 MG tablet Take 1 tablet (10 mg total) by mouth daily. 06/02/14   Tiffany L Reed, DO  mirtazapine (REMERON) 15 MG tablet TAKE 1 TABLET BY MOUTH EVERY DAY AT BEDTIME Patient not taking: Reported on 05/11/2015 04/18/14   Tiffany L Reed, DO  Tdap (BOOSTRIX) 5-2.5-18.5 LF-MCG/0.5 injection Inject 0.5 mLs into the muscle once. 04/17/15   Tiffany L Reed, DO  traMADol (ULTRAM) 50 MG tablet Take 1 tablet (50 mg total) by mouth 4 (four) times daily as needed for moderate pain. 09/01/14   Tiffany L Reed, DO   BP 152/53 mmHg  Pulse 93  Temp(Src) 97.1 F (36.2 C) (Oral)  Resp 20  SpO2 97% Physical Exam  Constitutional: She appears well-developed and well-nourished. No distress.  HENT:  Head: Normocephalic and atraumatic.  Mouth/Throat: Oropharynx is clear and moist.  Eyes: EOM are normal. Pupils are equal, round, and reactive to light.  Neck: Normal range of motion. Neck supple.  Cardiovascular: Normal rate and regular rhythm.   Pulmonary/Chest: Effort normal and breath sounds normal. No respiratory distress. She has no wheezes. She has no rales.  Abdominal: Soft. Bowel sounds are normal. She exhibits distension. She exhibits no mass. There is tenderness (diffuse tenderness with palpation.). There is no rebound and no guarding.  Musculoskeletal: Normal range of motion. She exhibits no edema or tenderness.  Neurological: She is alert.  Oriented to self. Moves all extremities without deficit.  Skin: Skin is warm and dry. No rash noted. No erythema.  Psychiatric: She has a normal mood and affect. Her behavior is normal.  Nursing note and vitals reviewed.   ED Course  Procedures (including critical care time) DIAGNOSTIC STUDIES: Oxygen Saturation is 99% on [place] 2.5 L/min, normal by my interpretation.    COORDINATION OF CARE: 2:30 AM Discussed treatment plan with pt at bedside and pt agreed to plan.   Labs Review Labs Reviewed  LIPASE, BLOOD - Abnormal; Notable for the  following:    Lipase 441 (*)    All other components within normal limits  COMPREHENSIVE METABOLIC PANEL - Abnormal; Notable for the following:    Potassium 3.1 (*)    CO2 16 (*)    Glucose, Bld 157 (*)    Creatinine, Ser 1.72 (*)    Albumin 3.4 (*)    ALT 10 (*)    Total Bilirubin 2.3 (*)    GFR calc non Af Amer 24 (*)    GFR calc Af Amer 28 (*)    Anion gap 16 (*)    All other components within normal limits  CBC - Abnormal; Notable for the following:    Platelets 412 (*)    All other components within normal limits  I-STAT CG4 LACTIC ACID, ED - Abnormal; Notable for the following:    Lactic Acid, Venous 8.31 (*)    All other components within normal limits  URINALYSIS, ROUTINE W REFLEX MICROSCOPIC (NOT AT [facility])    Imaging Review Ct Abdomen Pelvis Wo Contrast  05/11/2015  CLINICAL DATA:  Generalized abdominal pain, worse last night.  EXAM: CT ABDOMEN AND PELVIS WITHOUT CONTRAST  TECHNIQUE: Multidetector CT imaging of the abdomen and pelvis was performed following the standard protocol without IV contrast.  COMPARISON:  None.  FINDINGS: BODY WALL: No contributory findings.  LOWER CHEST: Small bilateral pleural effusion with right basilar atelectasis. Small lower pneumomediastinum through hiatal hernia.  ABDOMEN/PELVIS:  Liver: Few sub cm low densities favor cysts.  Biliary: Haziness of fat around the gallbladder is likely secondary. No primary inflammation suspected.  Pancreas: Unremarkable.  Spleen: Unremarkable.  Adrenals: Unremarkable.  Kidneys and ureters: Mild bilateral renal cortical atrophy. No hydronephrosis.  Bladder: Distended without wall thickening.  Reproductive: Hysterectomy.  No adnexal mass.  Bowel: There is a large volume of pneumoperitoneum distributed throughout the abdomen. Given the large volume, distribution is not helpful in identifying a source. Small volume ascites accompanies the fluid, with probable peritoneal thickening in the pelvic recesses. The source of  perforation is uncertain; there is no definitive pneumatosis or ulceration. No inflammatory wall thickening around the gastric antrum or duodenum. The cecum is gas filled and ventrally rotated, but there is no bascule or volvulus and no associated pneumatosis. Oral contrast reaches the third portion duodenum and there is no contrast extravasation.  Vascular: No acute abnormality.  OSSEOUS: No acute abnormalities.  These results were called by telephone at the time of interpretation on 05/11/2015 at 5:02 am to Dr. Julianne Rice , who verbally acknowledged these results.  IMPRESSION: 1. Perforated bowel with large pneumoperitoneum and small ascites. The site of perforation is uncertain; no extravasation of oral contrast which reaches the third portion duodenum. Colonic diverticulosis is present. 2. Atelectasis and small bilateral pleural effusion.   Electronically Signed   By: Monte Fantasia M.D.   On: 05/11/2015 05:07   Dg Chest Port 1 View  05/11/2015   CLINICAL DATA:  Hypoxia  EXAM: PORTABLE CHEST - 1 VIEW  COMPARISON:  02/28/2013  FINDINGS: Normal heart size and aortic contours.  Atelectasis in the right upper lobe seen on the previous study has resolved. There is an indistinct opacity at the right base obscuring the diaphragm which is elevated compared to prior. No edema, effusion, or air leak.  IMPRESSION: Hazy opacity at the right base favoring atelectasis. This area should be seen on abdominal CT scheduled for later today.   Electronically Signed   By: Monte Fantasia M.D.   On: 05/11/2015 03:41   I have personally reviewed and evaluated these images and lab results as part of my medical decision-making.   EKG Interpretation None      MDM   Final diagnoses:  Abdominal pain    I personally performed the services described in this documentation, which was scribed in my presence. The recorded information has been reviewed and is accurate.  Patient with free air in abdomen on CT scan. Will  start on antibiotics. Patient has a penicillin allergy and will start on Cipro and Flagyl. Discussed with Dr. Marcello Moores. She will see patient.   Julianne Rice, MD 05/11/15 [phone]

## 2015-05-11 NOTE — Anesthesia Preprocedure Evaluation (Signed)
Anesthesia Evaluation  Patient identified by MRN, date of birth, ID band Patient awake    Reviewed: Allergy & Precautions, NPO status , Patient's Chart, lab work & pertinent test results  Airway Mallampati: II  TM Distance: >3 FB Neck ROM: Full    Dental no notable dental hx.    Pulmonary asthma ,  breath sounds clear to auscultation  Pulmonary exam normal       Cardiovascular hypertension, Pt. on medications + Peripheral Vascular Disease Normal cardiovascular examRhythm:Regular Rate:Normal     Neuro/Psych negative neurological ROS  negative psych ROS   GI/Hepatic negative GI ROS, Neg liver ROS,   Endo/Other  negative endocrine ROS  Renal/GU negative Renal ROS  negative genitourinary   Musculoskeletal negative musculoskeletal ROS (+)   Abdominal   Peds negative pediatric ROS (+)  Hematology negative hematology ROS (+)   Anesthesia Other Findings   Reproductive/Obstetrics negative OB ROS                             Anesthesia Physical Anesthesia Plan  ASA: III and emergent  Anesthesia Plan: General   Post-op Pain Management:    Induction: Intravenous, Rapid sequence and Cricoid pressure planned  Airway Management Planned: Oral ETT  Additional Equipment:   Intra-op Plan:   Post-operative Plan: Possible Post-op intubation/ventilation  Informed Consent: I have reviewed the patients History and Physical, chart, labs and discussed the procedure including the risks, benefits and alternatives for the proposed anesthesia with the patient or authorized representative who has indicated his/her understanding and acceptance.   Dental advisory given  Plan Discussed with: CRNA and Surgeon  Anesthesia Plan Comments:         Anesthesia Quick Evaluation

## 2015-05-11 NOTE — Progress Notes (Signed)
Dr. Gifford Shave, anesthes, in to see pt; OK to transfer pt to ICU

## 2015-05-11 NOTE — Op Note (Signed)
05/11/2015  8:30 AM  PATIENT:  Elizabeth Peterson  81 y.o. female  Patient Care Team: Gayland Curry, DO as PCP - General (Geriatric Medicine)  PRE-OPERATIVE DIAGNOSIS:  perforated viscus  POST-OPERATIVE DIAGNOSIS:  perforated prepyloric gastric ulcer  PROCEDURE:   LAPAROSCOPY DIAGNOSTIC converted open repair gastric ulcer and omental patch   Surgeon(s): Leighton Ruff, MD  ASSISTANT: Dr Lucia Gaskins   ANESTHESIA:   local and general  EBL: 96ml Total I/O In: 1000 [I.V.:1000] Out: 325 [Urine:300; Blood:25]  DRAINS: none   SPECIMEN:  No Specimen  DISPOSITION OF SPECIMEN:  N/A  COUNTS:  YES  PLAN OF CARE: Admit to inpatient   PATIENT DISPOSITION:  PACU - hemodynamically stable.  INDICATION: 81 y.o. F with free air and peritonitis in ARF.  Risks and benefits explained to patient and family.  Consent obtained from Leighton prior to OR   OR FINDINGS: perforated pre-pyloric gastric ulcer, no gastric mass palpated.  Hiatal hernia  DESCRIPTION: the patient was identified in the preoperative holding area and taken to the OR where they were laid supine on the operating room table.  General anesthesia was induced without difficulty. SCDs were also noted to be in place prior to the initiation of anesthesia.  A Foley catheter was inserted under sterile conditions. The patient was then prepped and draped in the usual sterile fashion.   A surgical timeout was performed indicating the correct patient, procedure, positioning and need for preoperative antibiotics.   I began by making a infraumbilical incision using a 15 blade scalpel. Dissection was carried down through subcutaneous tissues and the fascia was elevated with Kocher clamps. The abdomen was entered after incision of the fascia with a 15 blade scalpel. A Hassan port was placed with a stay suture. 2 more 5 mm ports were placed in the right upper and left upper quadrants. The patient was placed in reverse Trendelenburg position. There was a  large amount of bilious contents within the upper abdomen. Upon manipulation of the stomach I identified the perforation underneath the patient's gallbladder. This appeared to be a prepyloric ulcer that had perforated and been sealed by the gallbladder. I do not feel that I could safely perform a omentopexy using laparoscopic equipment and therefore I converted to an open incision in the upper midline using Bovie electrocautery and placed a wound retractor into the abdomen. I began mobilizing the omentum off of the transverse colon using electrocautery. Once a pedicle of omentum was obtained, I called and my partner, Dr. Lucia Gaskins to help assist with retraction. He retracted the liver with a Deaver and I was able to get good exposure of the ulcer. I placed 3 2-0 silk sutures in the edges of the ulcer and tied these to reapproximate the tissues. I then used the tails of the sutures to pexy the omentum over the area of perforation. The abdomen was then irrigated with several liters of warm normal saline and all 4 quadrants. I placed 60 mL of diluted X Brill into the preperitoneal layer to assist with postoperative pain control.  The fascia was then closed using #1 running PDS sutures 2. The skin and subcutaneous tissues were left open and packed. The 5 mm port sites were closed using a 4-0 Vicryl suture. The patient was then sent to the postanesthesia care unit in stable condition. All counts were correct per operating room staff.

## 2015-05-11 NOTE — ED Notes (Signed)
Patient transported to CT 

## 2015-05-11 NOTE — H&P (Signed)
Elizabeth Peterson is an 81 y.o. female.   Chief Complaint: abd pain HPI: Elizabeth Peterson is a 80 y.o. female who presents to the Emergency Department complaining of moderate generalized abdominal pain onset several weeks ago and worsened yesterday evening around dinner time. She states associated nausea, vomiting onset today. Pt states pain is not exacerbated with eating. Last BM was normal yesterday. No Hx of abdominal surgery. She denies diarrhea, fever. Patient has history of dementia and is a difficult historian.  Past Medical History  Diagnosis Date  . HYPERLIPIDEMIA 04/16/2007  . ANEMIA-NOS 04/16/2007  . ANXIETY 04/16/2007  . DEPRESSION 09/03/2007  . HYPERTENSION 04/16/2007  . ATHEROSLERO NATV ART EXTREM W/INTERMIT CLAUDICAT 06/05/2010  . ALLERGIC RHINITIS 02/27/2009  . PNEUMONIA, COMMUNITY ACQUIRED, PNEUMOCOCCAL 12/07/2008  . ASTHMA 09/03/2007  . GERD 04/16/2007  . DIVERTICULOSIS, COLON 09/03/2007  . MENOPAUSAL DISORDER 04/18/2008  . OSTEOARTHRITIS, KNEE, LEFT 11/24/2009  . JOINT EFFUSION, LEFT KNEE 02/27/2009  . KNEE PAIN, BILATERAL 06/01/2010  . LOW BACK PAIN 04/16/2007  . LEG PAIN, LEFT 06/01/2010  . WEIGHT LOSS 07/25/2008  . RASH-NONVESICULAR 07/25/2008  . Abdominal pain, generalized 09/03/2007  . BREAST CANCER, HX OF 04/16/2007  . COLONIC POLYPS, HX OF 09/03/2007  . Dementia 06/17/2012    mild  . Peripheral neuropathy 06/17/2012    Past Surgical History  Procedure Laterality Date  . Abdominal hysterectomy  1966  . Tubal ligation    . S/p left mastectomy  2011    Family History  Problem Relation Age of Onset  . Cancer Sister     Breast  . Hypertension Other   . Diabetes Other     1st degree relative  . Hypertension Maternal Grandmother    Social History:  reports that she has never smoked. She has never used smokeless tobacco. She reports that she does not drink alcohol or use illicit drugs.  Allergies:  Allergies  Allergen Reactions  . Amlodipine Besylate     REACTION: rash  and itch  . Benzodiazepines Other (See Comments)    Hx of overuse/memory issue  . Penicillins   . Sulfonamide Derivatives      (Not in a hospital admission)  Results for orders placed or performed during the hospital encounter of 05/11/15 (from the past 48 hour(s))  Lipase, blood     Status: Abnormal   Collection Time: 05/11/15  3:05 AM  Result Value Ref Range   Lipase 441 (H) 22 - 51 U/L    Comment: RESULTS CONFIRMED BY MANUAL DILUTION  Comprehensive metabolic panel     Status: Abnormal   Collection Time: 05/11/15  3:05 AM  Result Value Ref Range   Sodium 139 135 - 145 mmol/L   Potassium 3.1 (L) 3.5 - 5.1 mmol/L   Chloride 107 101 - 111 mmol/L   CO2 16 (L) 22 - 32 mmol/L   Glucose, Bld 157 (H) 65 - 99 mg/dL   BUN 20 6 - 20 mg/dL   Creatinine, Ser 1.72 (H) 0.44 - 1.00 mg/dL   Calcium 9.0 8.9 - 10.3 mg/dL   Total Protein 7.3 6.5 - 8.1 g/dL   Albumin 3.4 (L) 3.5 - 5.0 g/dL   AST 36 15 - 41 U/L   ALT 10 (L) 14 - 54 U/L   Alkaline Phosphatase 51 38 - 126 U/L   Total Bilirubin 2.3 (H) 0.3 - 1.2 mg/dL   GFR calc non Af Amer 24 (L) >60 mL/min   GFR calc Af Amer 28 (L) >60 mL/min  Comment: (NOTE) The eGFR has been calculated using the CKD EPI equation. This calculation has not been validated in all clinical situations. eGFR's persistently <60 mL/min signify possible Chronic Kidney Disease.    Anion gap 16 (H) 5 - 15  CBC     Status: Abnormal   Collection Time: 05/11/15  3:05 AM  Result Value Ref Range   WBC 5.2 4.0 - 10.5 K/uL   RBC 4.21 3.87 - 5.11 MIL/uL   Hemoglobin 12.7 12.0 - 15.0 g/dL   HCT 39.3 36.0 - 46.0 %   MCV 93.3 78.0 - 100.0 fL   MCH 30.2 26.0 - 34.0 pg   MCHC 32.3 30.0 - 36.0 g/dL   RDW 13.6 11.5 - 15.5 %   Platelets 412 (H) 150 - 400 K/uL  I-Stat CG4 Lactic Acid, ED     Status: Abnormal   Collection Time: 05/11/15  3:23 AM  Result Value Ref Range   Lactic Acid, Venous 8.31 (HH) 0.5 - 2.0 mmol/L   Comment NOTIFIED PHYSICIAN    Ct Abdomen Pelvis Wo  Contrast  05/11/2015   CLINICAL DATA:  Generalized abdominal pain, worse last night.  EXAM: CT ABDOMEN AND PELVIS WITHOUT CONTRAST  TECHNIQUE: Multidetector CT imaging of the abdomen and pelvis was performed following the standard protocol without IV contrast.  COMPARISON:  None.  FINDINGS: BODY WALL: No contributory findings.  LOWER CHEST: Small bilateral pleural effusion with right basilar atelectasis. Small lower pneumomediastinum through hiatal hernia.  ABDOMEN/PELVIS:  Liver: Few sub cm low densities favor cysts.  Biliary: Haziness of fat around the gallbladder is likely secondary. No primary inflammation suspected.  Pancreas: Unremarkable.  Spleen: Unremarkable.  Adrenals: Unremarkable.  Kidneys and ureters: Mild bilateral renal cortical atrophy. No hydronephrosis.  Bladder: Distended without wall thickening.  Reproductive: Hysterectomy.  No adnexal mass.  Bowel: There is a large volume of pneumoperitoneum distributed throughout the abdomen. Given the large volume, distribution is not helpful in identifying a source. Small volume ascites accompanies the fluid, with probable peritoneal thickening in the pelvic recesses. The source of perforation is uncertain; there is no definitive pneumatosis or ulceration. No inflammatory wall thickening around the gastric antrum or duodenum. The cecum is gas filled and ventrally rotated, but there is no bascule or volvulus and no associated pneumatosis. Oral contrast reaches the third portion duodenum and there is no contrast extravasation.  Vascular: No acute abnormality.  OSSEOUS: No acute abnormalities.  These results were called by telephone at the time of interpretation on 05/11/2015 at 5:02 am to Dr. Julianne Rice , who verbally acknowledged these results.  IMPRESSION: 1. Perforated bowel with large pneumoperitoneum and small ascites. The site of perforation is uncertain; no extravasation of oral contrast which reaches the third portion duodenum. Colonic  diverticulosis is present. 2. Atelectasis and small bilateral pleural effusion.   Electronically Signed   By: Monte Fantasia M.D.   On: 05/11/2015 05:07   Dg Chest Port 1 View  05/11/2015   CLINICAL DATA:  Hypoxia  EXAM: PORTABLE CHEST - 1 VIEW  COMPARISON:  02/28/2013  FINDINGS: Normal heart size and aortic contours.  Atelectasis in the right upper lobe seen on the previous study has resolved. There is an indistinct opacity at the right base obscuring the diaphragm which is elevated compared to prior. No edema, effusion, or air leak.  IMPRESSION: Hazy opacity at the right base favoring atelectasis. This area should be seen on abdominal CT scheduled for later today.   Electronically Signed   By:  Monte Fantasia M.D.   On: 05/11/2015 03:41    Review of Systems  Constitutional: Negative for fever and chills.  HENT: Negative for congestion.   Eyes: Negative for blurred vision.  Respiratory: Negative for cough and shortness of breath.   Cardiovascular: Negative for chest pain.  Gastrointestinal: Positive for vomiting and abdominal pain. Negative for nausea, diarrhea and constipation.  Genitourinary: Negative for dysuria, urgency and frequency.  Musculoskeletal: Negative for myalgias, back pain and neck pain.  Skin: Negative for rash.  Neurological: Negative for dizziness, tingling and headaches.  Endo/Heme/Allergies: Does not bruise/bleed easily.  Psychiatric/Behavioral: Negative for depression.    Blood pressure 152/53, pulse 93, temperature 97.1 F (36.2 C), temperature source Oral, resp. rate 20, SpO2 97 %. Physical Exam  Constitutional: She is oriented to person, place, and time. She appears well-developed and well-nourished. No distress.  HENT:  Head: Normocephalic and atraumatic.  Eyes: Conjunctivae are normal. Pupils are equal, round, and reactive to light.  Neck: Normal range of motion. Neck supple.  Cardiovascular: Normal rate and regular rhythm.   Respiratory: Effort normal and  breath sounds normal. No respiratory distress.  GI: Soft. Bowel sounds are normal. She exhibits no distension. There is tenderness. There is rebound and guarding.  Musculoskeletal: Normal range of motion.  Neurological: She is alert and oriented to person, place, and time.  Skin: Skin is warm and dry. No rash noted. She is not diaphoretic.     Assessment/Plan 81 y.o. F with perforated viscus.  She is currently hemodynamically stable but in early renal failure and clearly has peritonitis on physical exam.  I discussed this with her and her son and daughter.  The cause is unknown.  We discussed laparoscopic examination and possible conversion to open surgery.  We discussed the need for colostomy if the colon was perforated.  We discussed the mortality rate even with surgery is high but would approach 100% if no surgery is performed.  Other risks include bleeding, damage to adjacent structures, complications from anesthesia including cardiac problems and lung problems, possible prolonged ventilation and other complications associated with prolonged hospital stays.  I discussed all of this with the POA and verbal consent was obtained over the phone.  Patient consents to surgery as well.    Graceyn Fodor C. 0/11/4915, 9:15 AM

## 2015-05-11 NOTE — Progress Notes (Signed)
Dr. Emmit Alexanders made aware are poor urine output since admit this AM. Instructions received to provide 500cc bolus at this time.

## 2015-05-11 NOTE — Anesthesia Postprocedure Evaluation (Signed)
  Anesthesia Post-op Note  Patient: Elizabeth Peterson  Procedure(s) Performed: Procedure(s) (LRB): LAPAROSCOPY DIAGNOSTIC converted open repair gastric ulcerand omental patch (N/A)  Patient Location: PACU  Anesthesia Type: General  Level of Consciousness: awake and alert   Airway and Oxygen Therapy: Patient Spontanous Breathing  Post-op Pain: mild  Post-op Assessment: Post-op Vital signs reviewed, Patient's Cardiovascular Status Stable, Respiratory Function Stable, Patent Airway and No signs of Nausea or vomiting  Last Vitals:  Filed Vitals:   05/11/15 1113  BP: 85/31  Pulse: 91  Temp:   Resp: 20    Post-op Vital Signs: stable   Complications: No apparent anesthesia complications

## 2015-05-12 ENCOUNTER — Inpatient Hospital Stay (HOSPITAL_COMMUNITY): Payer: Medicare Other

## 2015-05-12 ENCOUNTER — Encounter (HOSPITAL_COMMUNITY): Payer: Self-pay | Admitting: General Surgery

## 2015-05-12 LAB — GLUCOSE, CAPILLARY
GLUCOSE-CAPILLARY: 31 mg/dL — AB (ref 65–99)
GLUCOSE-CAPILLARY: 115 mg/dL — AB (ref 65–99)
GLUCOSE-CAPILLARY: 81 mg/dL (ref 65–99)
GLUCOSE-CAPILLARY: 131 mg/dL — AB (ref 65–99)
Glucose-Capillary: 104 mg/dL — ABNORMAL HIGH (ref 65–99)
Glucose-Capillary: 61 mg/dL — ABNORMAL LOW (ref 65–99)
Glucose-Capillary: 117 mg/dL — ABNORMAL HIGH (ref 65–99)
Glucose-Capillary: 60 mg/dL — ABNORMAL LOW (ref 65–99)

## 2015-05-12 LAB — BASIC METABOLIC PANEL
ANION GAP: 13 (ref 5–15)
BUN: 27 mg/dL — ABNORMAL HIGH (ref 6–20)
CALCIUM: 7.4 mg/dL — AB (ref 8.9–10.3)
CO2: 14 mmol/L — ABNORMAL LOW (ref 22–32)
Chloride: 114 mmol/L — ABNORMAL HIGH (ref 101–111)
Creatinine, Ser: 1.57 mg/dL — ABNORMAL HIGH (ref 0.44–1.00)
GFR, EST AFRICAN AMERICAN: 31 mL/min — AB (ref >60)
GFR, EST NON AFRICAN AMERICAN: 27 mL/min — AB (ref >60)
Glucose, Bld: 24 mg/dL — CL (ref 65–99)
Potassium: 4 mmol/L (ref 3.5–5.1)
Sodium: 141 mmol/L (ref 135–145)

## 2015-05-12 LAB — CBC
HEMATOCRIT: 32.2 % — AB (ref 36.0–46.0)
Hemoglobin: 10.2 g/dL — ABNORMAL LOW (ref 12.0–15.0)
MCH: 29.4 pg (ref 26.0–34.0)
MCHC: 31.7 g/dL (ref 30.0–36.0)
MCV: 92.8 fL (ref 78.0–100.0)
PLATELETS: ADEQUATE 10*3/uL (ref 150–400)
RBC: 3.47 MIL/uL — AB (ref 3.87–5.11)
RDW: 14 % (ref 11.5–15.5)
WBC: 13.7 10*3/uL — AB (ref 4.0–10.5)

## 2015-05-12 LAB — LACTIC ACID, PLASMA: Lactic Acid, Venous: 3.7 mmol/L (ref 0.5–2.0)

## 2015-05-12 MED ORDER — DEXTROSE-NACL 5-0.9 % IV SOLN
INTRAVENOUS | Status: DC
  Administered 2015-05-12 – 2015-05-13 (×4): via INTRAVENOUS
  Administered 2015-05-14: 75 mL/h via INTRAVENOUS
  Administered 2015-05-15: 04:00:00 via INTRAVENOUS

## 2015-05-12 MED ORDER — DEXTROSE 50 % IV SOLN
INTRAVENOUS | Status: AC
  Administered 2015-05-12: 21:00:00
  Filled 2015-05-12: qty 50

## 2015-05-12 MED ORDER — DEXTROSE 50 % IV SOLN
INTRAVENOUS | Status: AC
  Filled 2015-05-12: qty 50

## 2015-05-12 MED ORDER — DEXTROSE 50 % IV SOLN
1.0000 | Freq: Once | INTRAVENOUS | Status: AC
  Administered 2015-05-12: 50 mL via INTRAVENOUS

## 2015-05-12 MED ORDER — METRONIDAZOLE IN NACL 5-0.79 MG/ML-% IV SOLN
500.0000 mg | Freq: Three times a day (TID) | INTRAVENOUS | Status: AC
  Administered 2015-05-12 – 2015-05-19 (×20): 500 mg via INTRAVENOUS
  Filled 2015-05-12 (×21): qty 100

## 2015-05-12 MED ORDER — DEXTROSE 50 % IV SOLN
INTRAVENOUS | Status: AC
  Administered 2015-05-12: 25 mL via INTRAVENOUS
  Filled 2015-05-12: qty 50

## 2015-05-12 MED ORDER — CIPROFLOXACIN IN D5W 400 MG/200ML IV SOLN
400.0000 mg | INTRAVENOUS | Status: AC
  Administered 2015-05-13 – 2015-05-19 (×7): 400 mg via INTRAVENOUS
  Filled 2015-05-12 (×7): qty 200

## 2015-05-12 MED ORDER — ACETAMINOPHEN 10 MG/ML IV SOLN
1000.0000 mg | Freq: Four times a day (QID) | INTRAVENOUS | Status: AC
  Administered 2015-05-12 – 2015-05-13 (×4): 1000 mg via INTRAVENOUS
  Filled 2015-05-12 (×4): qty 100

## 2015-05-12 MED ORDER — SODIUM CHLORIDE 0.9 % IV BOLUS (SEPSIS)
500.0000 mL | Freq: Once | INTRAVENOUS | Status: AC
  Administered 2015-05-12: 500 mL via INTRAVENOUS

## 2015-05-12 MED ORDER — CIPROFLOXACIN IN D5W 400 MG/200ML IV SOLN: 400.0000 mg | Freq: Two times a day (BID) | INTRAVENOUS | Status: DC

## 2015-05-12 MED ORDER — DEXTROSE 50 % IV SOLN
25.0000 mL | Freq: Once | INTRAVENOUS | Status: AC
  Administered 2015-05-12 (×2): 25 mL via INTRAVENOUS

## 2015-05-12 NOTE — Progress Notes (Signed)
Pt hypoglycemic per BMET and CBG was 31. MD on call made aware and pt treated with Dextrose. Follow up CBG 115. Will continue to monitor

## 2015-05-12 NOTE — Consult Note (Signed)
PULMONARY / CRITICAL CARE MEDICINE   Name: Elizabeth Peterson MRN: [id number] DOB: 12-28-1918    ADMISSION DATE:  05/11/2015 CONSULTATION DATE: 8/18  REFERRING MD : [facility]  CHIEF COMPLAINT:  Perfed gastric ulcer  INITIAL PRESENTATION: Abd pain  STUDIES:    SIGNIFICANT EVENTS: 8/18 p lap   HISTORY OF PRESENT ILLNESS:   Post exp lap   SUBJECTIVE:   VITAL SIGNS: Temp:  [97.4 F (36.3 C)-98.7 F (37.1 C)] 98.4 F (36.9 C) (08/19 0708) Pulse Rate:  [72-111] 105 (08/19 0815) Resp:  [17-29] 17 (08/19 0900) BP: (85-132)/(25-100) 121/39 mmHg (08/19 0900) SpO2:  [95 %-100 %] 98 % (08/19 0900) Weight:  [117 lb 8.1 oz (53.3 kg)] 117 lb 8.1 oz (53.3 kg) (08/19 0550) HEMODYNAMICS:   VENTILATOR SETTINGS:   INTAKE / OUTPUT:  Intake/Output Summary (Last 24 hours) at 05/12/15 0957 Last data filed at 05/12/15 0900  Gross per 24 hour  Intake 4415.42 ml  Output    545 ml  Net 3870.42 ml    PHYSICAL EXAMINATION: General:  Frail elderly AAF confused but responsive Neuro:  Follows some commands HEENT:  Edentulous, oral mucosa moist, mild jvd  Cardiovascular:  HSR RRR Lungs: Decreased air movement Abdomen:  No BS Dressing CDI, ngt with clear drainage    Musculoskeletal:  Contractures of lower ext Skin:  Warm and dry  LABS:  CBC  Recent Labs Lab 05/11/15 0305 05/12/15 0414  WBC 5.2 13.7*  HGB 12.7 10.2*  HCT 39.3 32.2*  PLT 412* PLATELET CLUMPS NOTED ON SMEAR, COUNT APPEARS ADEQUATE   Coag's No results for input(s): APTT, INR in the last 168 hours. BMET  Recent Labs Lab 05/11/15 0305 05/12/15 0414  NA 139 141  K 3.1* 4.0  CL 107 114*  CO2 16* 14*  BUN 20 27*  CREATININE 1.72* 1.57*  GLUCOSE 157* 24*   Electrolytes  Recent Labs Lab 05/11/15 0305 05/12/15 0414  CALCIUM 9.0 7.4*   Sepsis Markers  Recent Labs Lab 05/11/15 0323 05/12/15 0414  LATICACIDVEN 8.31* 3.7*   ABG No results for input(s): PHART, PCO2ART, PO2ART in the last 168 hours. Liver  Enzymes  Recent Labs Lab 05/11/15 0305  AST 36  ALT 10*  ALKPHOS 51  BILITOT 2.3*  ALBUMIN 3.4*   Cardiac Enzymes No results for input(s): TROPONINI, PROBNP in the last 168 hours. Glucose  Recent Labs Lab 05/12/15 0601 05/12/15 0630 05/12/15 0752  GLUCAP 31* 115* 104*    Imaging Dg Chest Port 1 View  05/12/2015   CLINICAL DATA:  Respiratory failure.  EXAM: PORTABLE CHEST - 1 VIEW  COMPARISON:  05/11/2015.  FINDINGS: NG tube noted coiled in stomach. Mediastinum hilar structures are normal. Heart size stable. Right lower lobe infiltrate consistent pneumonia. Right pleural effusion. Subsegmental atelectasis left lung base. No pneumothorax. Surgical clip left upper abdomen.  IMPRESSION: 1.  NG tube noted coiled in stomach.  2. Right lower lobe infiltrate consistent pneumonia. Moderate right pleural effusion.   Electronically Signed   By: Marcello Moores  Register   On: 05/12/2015 07:10     ASSESSMENT / PLAN:  PULMONARY OETT A: No acute issue Monitor for hypoxia  P:   O2 as needed IS Pulmonary toilet  CARDIOVASCULAR CVL A:  Hx of HTN P:  Hold antihypertensive for now Gentle hydration  RENAL Lab Results  Component Value Date   CREATININE 1.57* 05/12/2015   CREATININE 1.72* 05/11/2015   CREATININE 1.14* 04/17/2015    A:  Concern for renal injury in 81 yo  post ct scan, on NSAIDS P:   Hydration Follow creatine  GASTROINTESTINAL A:  8/18 post open repair perforated prepyloric ulcer P:   Per [facility]  PPI  HEMATOLOGIC  Recent Labs  05/11/15 0305 05/12/15 0414  HGB 12.7 10.2*    A:  Concern for blood loss anemia P:  Follow cbc  INFECTIOUS A:   Concern for peritonitis  P:    Abx:  8/18 cipro >> 8/18 flagyl >>  ENDOCRINE A:   No acute issue P:     NEUROLOGIC A:  Dementia P:   RASS goal: 1 Safety precautions   FAMILY  - Updates: Updated at bedside.  - Inter-disciplinary family meet or Palliative Care meeting due by:  day  7    TODAY'S SUMMARY:  81 yo AAF who is wheelchair bound due to OA,  Takes NSIADS for pain. suffers from dementia and HTN and [facility] 8/18 with increased abd pain for several weeks.  Ct of abd revealed perforation and she was taken urgently to OR by [facility]. She had open repair of perforation and returned to ICU stable and awake 8/18. Due to her advanced age 45 asked PCCM to help with her care. 8/19 [place].  Richardson Landry Minor ACNP Maryanna Shape PCCM Pager [phone] till 3 pm If no answer page [phone] 05/12/2015, 9:57 AM

## 2015-05-12 NOTE — Progress Notes (Signed)
Patient ID: Elizabeth Peterson, female   DOB: 05-28-1919, 81 y.o.   MRN: [id number]     [facility]      [address]   [place], [place] [address]    Phone: [phone] FAX: [phone]     Subjective: uop down, IVF bolus given.  BP improved. C/o abdominal pain.  Confused.  Has dementia, unsure of what her baseline is.  Objective:  Vital signs:  Filed Vitals:   05/12/15 0400 05/12/15 0520 05/12/15 0550 05/12/15 0708  BP: 115/29 118/42    Pulse: 98 96    Temp:    98.4 F (36.9 C)  TempSrc:    Oral  Resp: 23 21    Height:      Weight:   53.3 kg (117 lb 8.1 oz)   SpO2: 99% 98%      Last BM Date: 05/11/15 (pre-op)  Intake/Output   Yesterday:  08/18 0701 - 08/19 0700 In: 5465.4 [I.V.:3935.4; NG/GT:30; IV Piggyback:1500] Out: 810 [Urine:535; Emesis/NG output:250; Blood:25] This shift: I/O last 3 completed shifts: In: 6565.4 [I.V.:3935.4; NG/GT:30; IV Piggyback:2600] Out: 810 [Urine:535; Emesis/NG output:250; Blood:25]    Physical Exam: General: Pt awake/alert/oriented x1 and in mild distress.  Chest: cta.  No chest wall pain w good excursion CV:  Pulses intact.  Regular rhythm Abdomen: Soft.  Nondistended.  Tender.  Dressing is dry. Ext:  SCDs BLE.  No mjr edema.  No cyanosis Skin: No petechiae / purpura   Problem List:   Active Problems:   Anemia   Essential hypertension   KNEE PAIN, BILATERAL   Alzheimer's disease   Gastric ulcer with perforation   Gastric ulcer with perf and open repair    Results:   Labs: Results for orders placed or performed during the hospital encounter of 05/11/15 (from the past 48 hour(s))  Lipase, blood     Status: Abnormal   Collection Time: 05/11/15  3:05 AM  Result Value Ref Range   Lipase 441 (H) 22 - 51 U/L    Comment: RESULTS CONFIRMED BY MANUAL DILUTION  Comprehensive metabolic panel     Status: Abnormal   Collection Time: 05/11/15  3:05 AM  Result Value Ref Range    Sodium 139 135 - 145 mmol/L   Potassium 3.1 (L) 3.5 - 5.1 mmol/L   Chloride 107 101 - 111 mmol/L   CO2 16 (L) 22 - 32 mmol/L   Glucose, Bld 157 (H) 65 - 99 mg/dL   BUN 20 6 - 20 mg/dL   Creatinine, Ser 1.18 (H) 0.44 - 1.00 mg/dL   Calcium 9.0 8.9 - 75.0 mg/dL   Total Protein 7.3 6.5 - 8.1 g/dL   Albumin 3.4 (L) 3.5 - 5.0 g/dL   AST 36 15 - 41 U/L   ALT 10 (L) 14 - 54 U/L   Alkaline Phosphatase 51 38 - 126 U/L   Total Bilirubin 2.3 (H) 0.3 - 1.2 mg/dL   GFR calc non Af Amer 24 (L) >60 mL/min   GFR calc Af Amer 28 (L) >60 mL/min    Comment: (NOTE) The eGFR has been calculated using the CKD EPI equation. This calculation has not been validated in all clinical situations. eGFR's persistently <60 mL/min signify possible Chronic Kidney Disease.    Anion gap 16 (H) 5 - 15  CBC     Status: Abnormal   Collection Time: 05/11/15  3:05 AM  Result Value Ref Range   WBC 5.2 4.0 - 10.5 K/uL  RBC 4.21 3.87 - 5.11 MIL/uL   Hemoglobin 12.7 12.0 - 15.0 g/dL   HCT 39.3 36.0 - 46.0 %   MCV 93.3 78.0 - 100.0 fL   MCH 30.2 26.0 - 34.0 pg   MCHC 32.3 30.0 - 36.0 g/dL   RDW 13.6 11.5 - 15.5 %   Platelets 412 (H) 150 - 400 K/uL  I-Stat CG4 Lactic Acid, ED     Status: Abnormal   Collection Time: 05/11/15  3:23 AM  Result Value Ref Range   Lactic Acid, Venous 8.31 (HH) 0.5 - 2.0 mmol/L   Comment NOTIFIED PHYSICIAN   ABO/Rh     Status: None   Collection Time: 05/11/15  6:45 AM  Result Value Ref Range   ABO/RH(D) AB POS   Type and screen     Status: None   Collection Time: 05/11/15  7:06 AM  Result Value Ref Range   ABO/RH(D) AB POS    Antibody Screen NEG    Sample Expiration 05/14/2015   MRSA PCR Screening     Status: None   Collection Time: 05/11/15  9:55 AM  Result Value Ref Range   MRSA by PCR NEGATIVE NEGATIVE    Comment:        The GeneXpert MRSA Assay (FDA approved for NASAL specimens only), is one component of a comprehensive MRSA colonization surveillance program. It is  not intended to diagnose MRSA infection nor to guide or monitor treatment for MRSA infections.   CBC     Status: Abnormal   Collection Time: 05/12/15  4:14 AM  Result Value Ref Range   WBC 13.7 (H) 4.0 - 10.5 K/uL    Comment: WHITE COUNT CONFIRMED ON SMEAR   RBC 3.47 (L) 3.87 - 5.11 MIL/uL   Hemoglobin 10.2 (L) 12.0 - 15.0 g/dL   HCT 32.2 (L) 36.0 - 46.0 %   MCV 92.8 78.0 - 100.0 fL   MCH 29.4 26.0 - 34.0 pg   MCHC 31.7 30.0 - 36.0 g/dL   RDW 14.0 11.5 - 15.5 %   Platelets  150 - 400 K/uL    PLATELET CLUMPS NOTED ON SMEAR, COUNT APPEARS ADEQUATE  Basic metabolic panel     Status: Abnormal   Collection Time: 05/12/15  4:14 AM  Result Value Ref Range   Sodium 141 135 - 145 mmol/L   Potassium 4.0 3.5 - 5.1 mmol/L    Comment: DELTA CHECK NOTED REPEATED TO VERIFY NO VISIBLE HEMOLYSIS    Chloride 114 (H) 101 - 111 mmol/L   CO2 14 (L) 22 - 32 mmol/L   Glucose, Bld 24 (LL) 65 - 99 mg/dL    Comment: CRITICAL RESULT CALLED TO, READ BACK BY AND VERIFIED WITH: GARCIA,K/2W $RemoveBeforeDEI'@0556'[id number]$  ON 05/12/15 BY KARCZEWSKI,S.    BUN 27 (H) 6 - 20 mg/dL   Creatinine, Ser 1.57 (H) 0.44 - 1.00 mg/dL   Calcium 7.4 (L) 8.9 - 10.3 mg/dL   GFR calc non Af Amer 27 (L) >60 mL/min   GFR calc Af Amer 31 (L) >60 mL/min    Comment: (NOTE) The eGFR has been calculated using the CKD EPI equation. This calculation has not been validated in all clinical situations. eGFR's persistently <60 mL/min signify possible Chronic Kidney Disease.    Anion gap 13 5 - 15  Lactic acid, plasma     Status: Abnormal   Collection Time: 05/12/15  4:14 AM  Result Value Ref Range   Lactic Acid, Venous 3.7 (HH) 0.5 - 2.0 mmol/L  Comment: CRITICAL RESULT CALLED TO, READ BACK BY AND VERIFIED WITH: GARCIA,K/2W $RemoveBeforeDEI'@0507'[id number]$  ON 05/12/15 BY KARCZEWSKI,S.   Glucose, capillary     Status: Abnormal   Collection Time: 05/12/15  6:01 AM  Result Value Ref Range   Glucose-Capillary 31 (LL) 65 - 99 mg/dL  Glucose, capillary     Status: Abnormal    Collection Time: 05/12/15  6:30 AM  Result Value Ref Range   Glucose-Capillary 115 (H) 65 - 99 mg/dL    Imaging / Studies: Ct Abdomen Pelvis Wo Contrast  05/11/2015   CLINICAL DATA:  Generalized abdominal pain, worse last night.  EXAM: CT ABDOMEN AND PELVIS WITHOUT CONTRAST  TECHNIQUE: Multidetector CT imaging of the abdomen and pelvis was performed following the standard protocol without IV contrast.  COMPARISON:  None.  FINDINGS: BODY WALL: No contributory findings.  LOWER CHEST: Small bilateral pleural effusion with right basilar atelectasis. Small lower pneumomediastinum through hiatal hernia.  ABDOMEN/PELVIS:  Liver: Few sub cm low densities favor cysts.  Biliary: Haziness of fat around the gallbladder is likely secondary. No primary inflammation suspected.  Pancreas: Unremarkable.  Spleen: Unremarkable.  Adrenals: Unremarkable.  Kidneys and ureters: Mild bilateral renal cortical atrophy. No hydronephrosis.  Bladder: Distended without wall thickening.  Reproductive: Hysterectomy.  No adnexal mass.  Bowel: There is a large volume of pneumoperitoneum distributed throughout the abdomen. Given the large volume, distribution is not helpful in identifying a source. Small volume ascites accompanies the fluid, with probable peritoneal thickening in the pelvic recesses. The source of perforation is uncertain; there is no definitive pneumatosis or ulceration. No inflammatory wall thickening around the gastric antrum or duodenum. The cecum is gas filled and ventrally rotated, but there is no bascule or volvulus and no associated pneumatosis. Oral contrast reaches the third portion duodenum and there is no contrast extravasation.  Vascular: No acute abnormality.  OSSEOUS: No acute abnormalities.  These results were called by telephone at the time of interpretation on 05/11/2015 at 5:02 am to Dr. Julianne Rice , who verbally acknowledged these results.  IMPRESSION: 1. Perforated bowel with large pneumoperitoneum and  small ascites. The site of perforation is uncertain; no extravasation of oral contrast which reaches the third portion duodenum. Colonic diverticulosis is present. 2. Atelectasis and small bilateral pleural effusion.   Electronically Signed   By: Monte Fantasia M.D.   On: 05/11/2015 05:07   Dg Chest Port 1 View  05/12/2015   CLINICAL DATA:  Respiratory failure.  EXAM: PORTABLE CHEST - 1 VIEW  COMPARISON:  05/11/2015.  FINDINGS: NG tube noted coiled in stomach. Mediastinum hilar structures are normal. Heart size stable. Right lower lobe infiltrate consistent pneumonia. Right pleural effusion. Subsegmental atelectasis left lung base. No pneumothorax. Surgical clip left upper abdomen.  IMPRESSION: 1.  NG tube noted coiled in stomach.  2. Right lower lobe infiltrate consistent pneumonia. Moderate right pleural effusion.   Electronically Signed   By: Marcello Moores  Register   On: 05/12/2015 07:10   Dg Chest Port 1 View  05/11/2015   CLINICAL DATA:  Hypoxia  EXAM: PORTABLE CHEST - 1 VIEW  COMPARISON:  02/28/2013  FINDINGS: Normal heart size and aortic contours.  Atelectasis in the right upper lobe seen on the previous study has resolved. There is an indistinct opacity at the right base obscuring the diaphragm which is elevated compared to prior. No edema, effusion, or air leak.  IMPRESSION: Hazy opacity at the right base favoring atelectasis. This area should be seen on abdominal CT scheduled for later  today.   Electronically Signed   By: Monte Fantasia M.D.   On: 05/11/2015 03:41    Medications / Allergies:  Scheduled Meds: . acetaminophen  1,000 mg Intravenous 4 times per day  . antiseptic oral rinse  7 mL Mouth Rinse q12n4p  . chlorhexidine  15 mL Mouth Rinse BID  . ciprofloxacin  400 mg Intravenous Q12H  . dextrose      . heparin  5,000 Units Subcutaneous 3 times per day  . metronidazole  500 mg Intravenous Q8H  . pantoprazole (PROTONIX) IV  80 mg Intravenous Q12H   Continuous Infusions: . dextrose 5 %  and 0.9% NaCl 125 mL/hr at 05/12/15 0648   PRN Meds:.fentaNYL (SUBLIMAZE) injection, ondansetron **OR** ondansetron (ZOFRAN) IV  Antibiotics: Anti-infectives    Start     Dose/Rate Route Frequency Ordered Stop   05/12/15 0730  ciprofloxacin (CIPRO) IVPB 400 mg     400 mg 200 mL/hr over 60 Minutes Intravenous Every 12 hours 05/12/15 0725 05/19/15 0729   05/12/15 0730  metroNIDAZOLE (FLAGYL) IVPB 500 mg     500 mg 100 mL/hr over 60 Minutes Intravenous Every 8 hours 05/12/15 0725 05/19/15 0729   05/12/15 0600  ciprofloxacin (CIPRO) IVPB 400 mg     400 mg 200 mL/hr over 60 Minutes Intravenous Every 24 hours 05/11/15 1118 05/12/15 0648   05/11/15 1100  metroNIDAZOLE (FLAGYL) IVPB 500 mg     500 mg 100 mL/hr over 60 Minutes Intravenous Every 8 hours 05/11/15 1036 05/11/15 1227   05/11/15 1045  ciprofloxacin (CIPRO) IVPB 400 mg  Status:  Discontinued     400 mg 200 mL/hr over 60 Minutes Intravenous Every 12 hours 05/11/15 1036 05/11/15 1117   05/11/15 0515  ciprofloxacin (CIPRO) IVPB 400 mg     400 mg 200 mL/hr over 60 Minutes Intravenous  Once 05/11/15 0503 05/11/15 0656   05/11/15 0515  metroNIDAZOLE (FLAGYL) IVPB 500 mg     500 mg 100 mL/hr over 60 Minutes Intravenous  Once 05/11/15 0503 05/11/15 4469        Assessment/Plan POD#1 diagnostic laparoscopy converted to open repair gastric ulcer and omental patch -- 05/11/2015 - Dr. Joyice Faster  -continue NGT and await for bowel function  - likely needs to be studied Monday or Tuesday unless suspicion for a leak is low  -give IV tylenol x24h for pain, PRN fentanyl  -protonix $RemoveBe'80mg'[id number]$  IV BID -add flutter valve, up to chair, PT eval AKI-strict I&O, hydrate, avoid nephrotoxins, AM labs HTN-home meds on hold Dementia-baseline unclear ID-resume cipro/flagyl for total of 7 days pos op FEN-IVF, NPO Dispo-continue ICU  Appreciate CCM management  Erby Pian, ANP-BC [facility] Pager [phone](7A-4:30P)   05/12/2015 8:00  AM  Agree with above.  Alphonsa Overall, MD, [facility] Pager: [phone] Office phone:  [phone]

## 2015-05-12 NOTE — Progress Notes (Signed)
eLink Physician-Brief Progress Note Patient Name: Reeve Mallo DOB: 09-25-1918 MRN: [id number]   Date of Service  05/12/2015  HPI/Events of Note  Hypoglycemia in the setting of AKI.  eICU Interventions  Plan: 1 amp of D50 now Change IVFs to D5NS      Intervention Category Evaluation Type: Other  [place] 05/12/2015, 6:04 AM

## 2015-05-12 NOTE — Progress Notes (Signed)
Pt with only 25cc of urinary output since 1900 on 05/11/15. MD on call made aware and orders received.

## 2015-05-12 NOTE — Progress Notes (Signed)
eLink Physician-Brief Progress Note Patient Name: Mitzie Marlar DOB: January 06, 1919 MRN: [id number]   Date of Service  05/12/2015  HPI/Events of Note  Ongoing hypotension despite fluid bolus and IVFs.  Has AKI and oliguria.  Current BP of 103/29 (52) and HR of 102.  Has PIV.  Is 4 liters positive.  eICU Interventions  Plan: Additonal 500 cc fluid bolus of NS If BP does not respond consider aline to HD monitoring May need pressor support     Intervention Category Intermediate Interventions: Hypotension - evaluation and management  DETERDING,ELIZABETH 05/12/2015, 12:54 AM

## 2015-05-13 LAB — BASIC METABOLIC PANEL
ANION GAP: 5 (ref 5–15)
BUN: 27 mg/dL — ABNORMAL HIGH (ref 6–20)
CHLORIDE: 121 mmol/L — AB (ref 101–111)
CO2: 15 mmol/L — AB (ref 22–32)
CREATININE: 1.37 mg/dL — AB (ref 0.44–1.00)
Calcium: 7.1 mg/dL — ABNORMAL LOW (ref 8.9–10.3)
GFR calc non Af Amer: 32 mL/min — ABNORMAL LOW (ref >60)
GFR, EST AFRICAN AMERICAN: 37 mL/min — AB (ref >60)
Glucose, Bld: 91 mg/dL (ref 65–99)
POTASSIUM: 3.2 mmol/L — AB (ref 3.5–5.1)
SODIUM: 141 mmol/L (ref 135–145)

## 2015-05-13 LAB — CBC
HEMATOCRIT: 24.1 % — AB (ref 36.0–46.0)
HEMOGLOBIN: 7.8 g/dL — AB (ref 12.0–15.0)
MCH: 29.9 pg (ref 26.0–34.0)
MCHC: 32.4 g/dL (ref 30.0–36.0)
MCV: 92.3 fL (ref 78.0–100.0)
Platelets: 267 10*3/uL (ref 150–400)
RBC: 2.61 MIL/uL — AB (ref 3.87–5.11)
RDW: 14.4 % (ref 11.5–15.5)
WBC: 18.8 10*3/uL — AB (ref 4.0–10.5)

## 2015-05-13 LAB — GLUCOSE, CAPILLARY
GLUCOSE-CAPILLARY: 89 mg/dL (ref 65–99)
GLUCOSE-CAPILLARY: 79 mg/dL (ref 65–99)
Glucose-Capillary: 82 mg/dL (ref 65–99)
Glucose-Capillary: 83 mg/dL (ref 65–99)
Glucose-Capillary: 87 mg/dL (ref 65–99)

## 2015-05-13 LAB — MAGNESIUM: MAGNESIUM: 1.4 mg/dL — AB (ref 1.7–2.4)

## 2015-05-13 LAB — PHOSPHORUS: PHOSPHORUS: 2.5 mg/dL (ref 2.5–4.6)

## 2015-05-13 MED ORDER — POTASSIUM CHLORIDE 10 MEQ/100ML IV SOLN
10.0000 meq | INTRAVENOUS | Status: AC
  Administered 2015-05-13 (×4): 10 meq via INTRAVENOUS
  Filled 2015-05-13 (×4): qty 100

## 2015-05-13 NOTE — Plan of Care (Signed)
Problem: Phase I Progression Outcomes Goal: OOB as tolerated unless otherwise ordered Outcome: Progressing Dangled on side of bed with PT earlier.

## 2015-05-13 NOTE — Evaluation (Signed)
Physical Therapy Evaluation Patient Details Name: Elizabeth Peterson MRN: [id number] DOB: 13-Aug-1919 Today's Date: 05/13/2015   History of Present Illness  LAPAROSCOPY DIAGNOSTIC converted open repair gastric ulcer and omental patch on 8/18.  Clinical Impression  Patient did participate in sitting at the edge of the bed. Transfers will be guarded as patient has knee contractures. Family reports that patient  Was transfers only PTA.Patient will benefit from PT to address problems listed in note below.    Follow Up Recommendations Home health PT;Supervision/Assistance - 24 hour    Equipment Recommendations  None recommended by PT    Recommendations for Other Services       Precautions / Restrictions Precautions Precautions: Fall Precaution Comments: abd. incision, knees contracted, NG suction      Mobility  Bed Mobility Overal bed mobility: Needs Assistance Bed Mobility: Supine to Sit;Sit to Supine     Supine to sit: Max assist;HOB elevated Sit to supine: Max assist;+2 for safety/equipment;+2 for physical assistance   General bed mobility comments: attempted to have patient roll, bed pad used to roll and lift trunk into upright posture,support of trunk given, scooted to edge with pad.+ 2 to return to suopine  Transfers                    Ambulation/Gait                Stairs            Wheelchair Mobility    Modified Rankin (Stroke Patients Only)       Balance Overall balance assessment: Needs assistance Sitting-balance support: Feet supported;Feet unsupported;Bilateral upper extremity supported Sitting balance-Leahy Scale: Poor Sitting balance - Comments: gradually gained balance in sitting. sta x 8 minutes, attempted knee extension u too painful,                                     Pertinent Vitals/Pain Pain Assessment: Faces Faces Pain Scale: Hurts whole lot Pain Location: both legs Pain Descriptors / Indicators:  Grimacing;Guarding Pain Intervention(s): Repositioned;RN gave pain meds during session;Limited activity within patient's tolerance    Home Living Family/patient expects to be discharged to:: Private residence Living Arrangements: Children Available Help at Discharge: Family;Available 24 hours/day Type of Home: House         Home Equipment: Bedside commode;Wheelchair - manual      Prior Function Level of Independence: Needs assistance   Gait / Transfers Assistance Needed: family reports that patient coul;d transfer to [facility] independentlyat times, assisted to [facility], non ambulatory           Hand Dominance        Extremity/Trunk Assessment   Upper Extremity Assessment: Generalized weakness           Lower Extremity Assessment: LLE deficits/detail;RLE deficits/detail RLE Deficits / Details: knee flexion contractures,  LLE Deficits / Details: same  Cervical / Trunk Assessment: Kyphotic  Communication      Cognition Arousal/Alertness: Awake/alert Behavior During Therapy: WFL for tasks assessed/performed Overall Cognitive Status: History of cognitive impairments - at baseline                      General Comments      Exercises        Assessment/Plan    PT Assessment Patient needs continued PT services  PT Diagnosis Difficulty walking;Acute pain;Generalized weakness   PT Problem List Decreased strength;Decreased  range of motion;Decreased activity tolerance;Decreased mobility;Decreased cognition;Decreased balance;Decreased safety awareness;Decreased knowledge of precautions;Pain  PT Treatment Interventions Functional mobility training;Therapeutic exercise;Patient/family education;Wheelchair mobility training   PT Goals (Current goals can be found in the Care Plan section) Acute Rehab PT Goals Patient Stated Goal: to return home PT Goal Formulation: With family Time For Goal Achievement: 05/27/15 Potential to Achieve Goals: Fair    Frequency Min  3X/week   Barriers to discharge        Co-evaluation               End of Session   Activity Tolerance: Patient tolerated treatment well;Patient limited by lethargy Patient left: in bed;with call bell/phone within reach;with bed alarm set;with restraints reapplied Nurse Communication: Mobility status         Time: 1359-1418 PT Time Calculation (min) (ACUTE ONLY): 19 min   Charges:   PT Evaluation $Initial PT Evaluation Tier I: 1 Procedure     PT G CodesClaretha Cooper 05/13/2015, 2:47 PM Tresa Endo PT [phone]

## 2015-05-13 NOTE — Progress Notes (Signed)
Patient ID: Elizabeth Peterson, female   DOB: October 29, 1918, 81 y.o.   MRN: [id number] [facility] Surgery Progress Note:   2 Days Post-Op  Subjective: Mental status is talkative for a 43+ year old.  Seemed appropriate for circumstances.   Objective: Vital signs in last 24 hours: Temp:  [98.6 F (37 C)-99.1 F (37.3 C)] 99.1 F (37.3 C) (08/20 0400) Pulse Rate:  [87-129] 94 (08/20 0500) Resp:  [17-38] 19 (08/20 0500) BP: (112-170)/(37-67) 133/41 mmHg (08/20 0500) SpO2:  [95 %-100 %] 98 % (08/20 0500)  Intake/Output from previous day: 08/19 0701 - 08/20 0700 In: 2955 [I.V.:2125; NG/GT:30; IV Piggyback:800] Out: 417 [Urine:417] Intake/Output this shift:    Physical Exam: Work of breathing is not elevated.  Wound being packed and margins appear not red or infected.  NG in place.    Lab Results:  Results for orders placed or performed during the hospital encounter of 05/11/15 (from the past 48 hour(s))  MRSA PCR Screening     Status: None   Collection Time: 05/11/15  9:55 AM  Result Value Ref Range   MRSA by PCR NEGATIVE NEGATIVE    Comment:        The GeneXpert MRSA Assay (FDA approved for NASAL specimens only), is one component of a comprehensive MRSA colonization surveillance program. It is not intended to diagnose MRSA infection nor to guide or monitor treatment for MRSA infections.   CBC     Status: Abnormal   Collection Time: 05/12/15  4:14 AM  Result Value Ref Range   WBC 13.7 (H) 4.0 - 10.5 K/uL    Comment: WHITE COUNT CONFIRMED ON SMEAR   RBC 3.47 (L) 3.87 - 5.11 MIL/uL   Hemoglobin 10.2 (L) 12.0 - 15.0 g/dL   HCT 32.2 (L) 36.0 - 46.0 %   MCV 92.8 78.0 - 100.0 fL   MCH 29.4 26.0 - 34.0 pg   MCHC 31.7 30.0 - 36.0 g/dL   RDW 14.0 11.5 - 15.5 %   Platelets  150 - 400 K/uL    PLATELET CLUMPS NOTED ON SMEAR, COUNT APPEARS ADEQUATE  Basic metabolic panel     Status: Abnormal   Collection Time: 05/12/15  4:14 AM  Result Value Ref Range   Sodium 141 135 - 145  mmol/L   Potassium 4.0 3.5 - 5.1 mmol/L    Comment: DELTA CHECK NOTED REPEATED TO VERIFY NO VISIBLE HEMOLYSIS    Chloride 114 (H) 101 - 111 mmol/L   CO2 14 (L) 22 - 32 mmol/L   Glucose, Bld 24 (LL) 65 - 99 mg/dL    Comment: CRITICAL RESULT CALLED TO, READ BACK BY AND VERIFIED WITH: GARCIA,K/2W _0  ON 05/12/15 BY KARCZEWSKI,S.    BUN 27 (H) 6 - 20 mg/dL   Creatinine, Ser 1.57 (H) 0.44 - 1.00 mg/dL   Calcium 7.4 (L) 8.9 - 10.3 mg/dL   GFR calc non Af Amer 27 (L) >60 mL/min   GFR calc Af Amer 31 (L) >60 mL/min    Comment: (NOTE) The eGFR has been calculated using the CKD EPI equation. This calculation has not been validated in all clinical situations. eGFR's persistently <60 mL/min signify possible Chronic Kidney Disease.    Anion gap 13 5 - 15  Lactic acid, plasma     Status: Abnormal   Collection Time: 05/12/15  4:14 AM  Result Value Ref Range   Lactic Acid, Venous 3.7 (HH) 0.5 - 2.0 mmol/L    Comment: CRITICAL RESULT CALLED TO, READ BACK BY  AND VERIFIED WITH: GARCIA,K/2W _0  ON 05/12/15 BY KARCZEWSKI,S.   Glucose, capillary     Status: Abnormal   Collection Time: 05/12/15  6:01 AM  Result Value Ref Range   Glucose-Capillary 31 (LL) 65 - 99 mg/dL  Glucose, capillary     Status: Abnormal   Collection Time: 05/12/15  6:30 AM  Result Value Ref Range   Glucose-Capillary 115 (H) 65 - 99 mg/dL  Glucose, capillary     Status: Abnormal   Collection Time: 05/12/15  7:52 AM  Result Value Ref Range   Glucose-Capillary 104 (H) 65 - 99 mg/dL   Comment 1 Notify RN    Comment 2 Document in Chart   Glucose, capillary     Status: Abnormal   Collection Time: 05/12/15  1:08 PM  Result Value Ref Range   Glucose-Capillary 61 (L) 65 - 99 mg/dL   Comment 1 Notify RN    Comment 2 Document in Chart   Glucose, capillary     Status: Abnormal   Collection Time: 05/12/15  1:41 PM  Result Value Ref Range   Glucose-Capillary 117 (H) 65 - 99 mg/dL  Glucose, capillary     Status: None    Collection Time: 05/12/15  4:14 PM  Result Value Ref Range   Glucose-Capillary 81 65 - 99 mg/dL  Glucose, capillary     Status: Abnormal   Collection Time: 05/12/15  7:28 PM  Result Value Ref Range   Glucose-Capillary 60 (L) 65 - 99 mg/dL   Comment 1 Notify RN   Glucose, capillary     Status: Abnormal   Collection Time: 05/12/15  8:04 PM  Result Value Ref Range   Glucose-Capillary 131 (H) 65 - 99 mg/dL   Comment 1 Notify RN   Glucose, capillary     Status: None   Collection Time: 05/13/15 12:05 AM  Result Value Ref Range   Glucose-Capillary 82 65 - 99 mg/dL  CBC     Status: Abnormal   Collection Time: 05/13/15  3:50 AM  Result Value Ref Range   WBC 18.8 (H) 4.0 - 10.5 K/uL   RBC 2.61 (L) 3.87 - 5.11 MIL/uL   Hemoglobin 7.8 (L) 12.0 - 15.0 g/dL    Comment: DELTA CHECK NOTED REPEATED TO VERIFY    HCT 24.1 (L) 36.0 - 46.0 %   MCV 92.3 78.0 - 100.0 fL   MCH 29.9 26.0 - 34.0 pg   MCHC 32.4 30.0 - 36.0 g/dL   RDW 14.4 11.5 - 15.5 %   Platelets 267 150 - 400 K/uL  Basic metabolic panel     Status: Abnormal   Collection Time: 05/13/15  3:50 AM  Result Value Ref Range   Sodium 141 135 - 145 mmol/L   Potassium 3.2 (L) 3.5 - 5.1 mmol/L    Comment: DELTA CHECK NOTED REPEATED TO VERIFY    Chloride 121 (H) 101 - 111 mmol/L   CO2 15 (L) 22 - 32 mmol/L   Glucose, Bld 91 65 - 99 mg/dL   BUN 27 (H) 6 - 20 mg/dL   Creatinine, Ser 1.37 (H) 0.44 - 1.00 mg/dL   Calcium 7.1 (L) 8.9 - 10.3 mg/dL   GFR calc non Af Amer 32 (L) >60 mL/min   GFR calc Af Amer 37 (L) >60 mL/min    Comment: (NOTE) The eGFR has been calculated using the CKD EPI equation. This calculation has not been validated in all clinical situations. eGFR's persistently <60 mL/min signify possible Chronic Kidney Disease.  Anion gap 5 5 - 15  Phosphorus     Status: None   Collection Time: 05/13/15  3:50 AM  Result Value Ref Range   Phosphorus 2.5 2.5 - 4.6 mg/dL  Magnesium     Status: Abnormal   Collection Time:  05/13/15  3:50 AM  Result Value Ref Range   Magnesium 1.4 (L) 1.7 - 2.4 mg/dL    Radiology/Results: Dg Chest Port 1 View  05/12/2015   CLINICAL DATA:  Respiratory failure.  EXAM: PORTABLE CHEST - 1 VIEW  COMPARISON:  05/11/2015.  FINDINGS: NG tube noted coiled in stomach. Mediastinum hilar structures are normal. Heart size stable. Right lower lobe infiltrate consistent pneumonia. Right pleural effusion. Subsegmental atelectasis left lung base. No pneumothorax. Surgical clip left upper abdomen.  IMPRESSION: 1.  NG tube noted coiled in stomach.  2. Right lower lobe infiltrate consistent pneumonia. Moderate right pleural effusion.   Electronically Signed   By: Marcello Moores  Register   On: 05/12/2015 07:10    Anti-infectives: Anti-infectives    Start     Dose/Rate Route Frequency Ordered Stop   05/13/15 0600  ciprofloxacin (CIPRO) IVPB 400 mg     400 mg 200 mL/hr over 60 Minutes Intravenous Every 24 hours 05/12/15 0757 05/20/15 0559   05/12/15 0800  metroNIDAZOLE (FLAGYL) IVPB 500 mg     500 mg 100 mL/hr over 60 Minutes Intravenous Every 8 hours 05/12/15 0725 05/19/15 0759   05/12/15 0730  ciprofloxacin (CIPRO) IVPB 400 mg  Status:  Discontinued     400 mg 200 mL/hr over 60 Minutes Intravenous Every 12 hours 05/12/15 0725 05/12/15 0756   05/12/15 0600  ciprofloxacin (CIPRO) IVPB 400 mg     400 mg 200 mL/hr over 60 Minutes Intravenous Every 24 hours 05/11/15 1118 05/12/15 0648   05/11/15 1100  metroNIDAZOLE (FLAGYL) IVPB 500 mg     500 mg 100 mL/hr over 60 Minutes Intravenous Every 8 hours 05/11/15 1036 05/11/15 1227   05/11/15 1045  ciprofloxacin (CIPRO) IVPB 400 mg  Status:  Discontinued     400 mg 200 mL/hr over 60 Minutes Intravenous Every 12 hours 05/11/15 1036 05/11/15 1117   05/11/15 0515  ciprofloxacin (CIPRO) IVPB 400 mg     400 mg 200 mL/hr over 60 Minutes Intravenous  Once 05/11/15 0503 05/11/15 0656   05/11/15 0515  metroNIDAZOLE (FLAGYL) IVPB 500 mg     500 mg 100 mL/hr over 60  Minutes Intravenous  Once 05/11/15 0503 05/11/15 3545      Assessment/Plan: Problem List: Patient Active Problem List   Diagnosis Date Noted  . Gastric ulcer with perforation 05/11/2015  . Gastric ulcer with perf and open repair 05/11/2015  . Primary osteoarthritis of both knees 06/02/2014  . Atopic dermatitis 02/10/2014  . Seasonal allergies 02/10/2014  . Alzheimer's disease 02/10/2014  . Osteoarthritis of both knees 12/06/2013  . Bilateral leg pain 06/03/2013  . CAP (community acquired pneumonia) 03/02/2013  . Normocytic anemia 03/01/2013  . Hyponatremia with generalized weakness, anorexia 02/28/2013  . Back pain 02/28/2013  . Anxiety and depression 02/28/2013  . Severe protein-calorie malnutrition 02/28/2013  . Hypokalemia 02/28/2013  . Cough 11/30/2012  . Insomnia 11/30/2012  . Dementia 06/17/2012  . Peripheral neuropathy 06/17/2012  . Left knee pain 12/06/2011  . Degenerative joint disease 06/07/2011  . Encounter for long-term (current) use of high-risk medication 01/07/2011  . Preventative health care 01/04/2011  . KNEE PAIN, BILATERAL 06/01/2010  . OSTEOARTHRITIS, KNEE, LEFT 11/24/2009  . ALLERGIC RHINITIS 02/27/2009  .  WEIGHT LOSS 07/25/2008  . MENOPAUSAL DISORDER 04/18/2008  . DEPRESSION 09/03/2007  . ASTHMA 09/03/2007  . DIVERTICULOSIS, COLON 09/03/2007  . COLONIC POLYPS, HX OF 09/03/2007  . HYPERLIPIDEMIA 04/16/2007  . Anemia 04/16/2007  . ANXIETY 04/16/2007  . Essential hypertension 04/16/2007  . GERD 04/16/2007  . LOW BACK PAIN 04/16/2007  . BREAST CANCER, HX OF 04/16/2007    Stable.  Will transfer to the floor.  Continue NG and consider swallow before removing NG.   2 Days Post-Op    LOS: 2 days   Matt B. Hassell Done, MD, [facility] [phone] beeper [phone]  05/13/2015 8:13 AM

## 2015-05-13 NOTE — Progress Notes (Signed)
Dr. Zella Richer aware via phone pt pulled NGT out. Orders received to leave NGT out and keep pt NPO.

## 2015-05-13 NOTE — Progress Notes (Signed)
eLink Physician-Brief Progress Note Patient Name: Elizabeth Peterson DOB: 1919/09/04 MRN: [id number]   Date of Service  05/13/2015  HPI/Events of Note  Hypokalemia.  eICU Interventions  KCl 40 mEq IV     Intervention Category Intermediate Interventions: Electrolyte abnormality - evaluation and management  Tera Partridge 05/13/2015, 5:39 AM

## 2015-05-13 NOTE — Consult Note (Signed)
PULMONARY / CRITICAL CARE MEDICINE   Name: Elizabeth Peterson MRN: [id number] DOB: [birth year]    ADMISSION DATE:  05/11/2015 CONSULTATION DATE: 8/18  REFERRING MD : CCS  CHIEF COMPLAINT:  Perfed gastric ulcer  INITIAL PRESENTATION: Abd pain  STUDIES:    SIGNIFICANT EVENTS: 8/18 p lap   HISTORY OF PRESENT ILLNESS:   Post exp lap   SUBJECTIVE: Doing well today AM. No C/O pain, dyspnea.   VITAL SIGNS: Temp:  [98.6 F (37 C)-99.1 F (37.3 C)] 99.1 F (37.3 C) (08/20 0400) Pulse Rate:  [87-129] 89 (08/20 0800) Resp:  [17-38] 19 (08/20 0800) BP: (112-170)/(37-67) 131/44 mmHg (08/20 0800) SpO2:  [95 %-100 %] 99 % (08/20 0800) HEMODYNAMICS:     INTAKE / OUTPUT:  Intake/Output Summary (Last 24 hours) at 05/13/15 0935 Last data filed at 05/13/15 0800  Gross per 24 hour  Intake   3655 ml  Output    502 ml  Net   3153 ml    PHYSICAL EXAMINATION: General:  Frail elderly AAF alert and responsive Neuro:  Follows some commands HEENT:  Edentulous, oral mucosa moist, mild jvd  Cardiovascular:  RRR, No MRG Lungs: Decreased air movement Abdomen:  No BS Dressing CDI, ngt with clear drainage    Musculoskeletal:  Contractures of lower ext Skin:  Warm and dry  LABS:  CBC  Recent Labs Lab 05/11/15 0305 05/12/15 0414 05/13/15 0350  WBC 5.2 13.7* 18.8*  HGB 12.7 10.2* 7.8*  HCT 39.3 32.2* 24.1*  PLT 412* PLATELET CLUMPS NOTED ON SMEAR, COUNT APPEARS ADEQUATE 267   Coag's No results for input(s): APTT, INR in the last 168 hours. BMET  Recent Labs Lab 05/11/15 0305 05/12/15 0414 05/13/15 0350  NA 139 141 141  K 3.1* 4.0 3.2*  CL 107 114* 121*  CO2 16* 14* 15*  BUN 20 27* 27*  CREATININE 1.72* 1.57* 1.37*  GLUCOSE 157* 24* 91   Electrolytes  Recent Labs Lab 05/11/15 0305 05/12/15 0414 05/13/15 0350  CALCIUM 9.0 7.4* 7.1*  MG  --   --  1.4*  PHOS  --   --  2.5   Sepsis Markers  Recent Labs Lab 05/11/15 0323 05/12/15 0414  LATICACIDVEN 8.31* 3.7*    ABG No results for input(s): PHART, PCO2ART, PO2ART in the last 168 hours. Liver Enzymes  Recent Labs Lab 05/11/15 0305  AST 36  ALT 10*  ALKPHOS 51  BILITOT 2.3*  ALBUMIN 3.4*   Cardiac Enzymes No results for input(s): TROPONINI, PROBNP in the last 168 hours. Glucose  Recent Labs Lab 05/12/15 1308 05/12/15 1341 05/12/15 1614 05/12/15 1928 05/12/15 2004 05/13/15 0005  GLUCAP 61* 117* 81 60* 131* 82    Imaging No results found.   ASSESSMENT / PLAN:  PULMONARY OETT A: No acute issue Monitor for hypoxia  P:   O2 as needed IS Pulmonary toilet  CARDIOVASCULAR CVL A:  Hx of HTN P:  Hold antihypertensive for now Gentle hydration  RENAL Lab Results  Component Value Date   CREATININE 1.37* 05/13/2015   CREATININE 1.57* 05/12/2015   CREATININE 1.72* 05/11/2015    A:  Concern for renal injury in 81 yo  post ct scan, on NSAIDS P:   Hydration Follow creatine  GASTROINTESTINAL A:  8/18 post open repair perforated prepyloric ulcer P:   Per CCS  PPI  HEMATOLOGIC  Recent Labs  05/12/15 0414 05/13/15 0350  HGB 10.2* 7.8*    A:  Concern for blood loss anemia P:  Follow cbc  INFECTIOUS A:   Concern for peritonitis  P:    Abx:  8/18 cipro >> 8/18 flagyl >>  ENDOCRINE A:   No acute issue P:     NEUROLOGIC A:  Dementia P:   RASS goal: 1 Safety precautions   FAMILY  - Updates: Updated at bedside. - Inter-disciplinary family meet or Palliative Care meeting due by:  day 7    TODAY'S SUMMARY:  81 yo AAF who is wheelchair bound due to OA,  Takes NSIADS for pain. suffers from dementia and HTN and presents to [facility] 8/18 with increased abd pain for several weeks.  Ct of abd revealed perforation and she was taken urgently to OR by CCS. She had open repair of perforation and returned to ICU stable and awake 8/18. Due to her advanced age 13 asked PCCM to help with her care. Will get transferred to floor on 8/20.  Marshell Garfinkel  MD [place] [facility] Pager [phone] If no answer or after 3pm call: [phone] 05/13/2015, 9:37 AM

## 2015-05-14 ENCOUNTER — Inpatient Hospital Stay (HOSPITAL_COMMUNITY): Payer: Medicare Other

## 2015-05-14 DIAGNOSIS — G309 Alzheimer's disease, unspecified: Secondary | ICD-10-CM

## 2015-05-14 DIAGNOSIS — J4520 Mild intermittent asthma, uncomplicated: Secondary | ICD-10-CM

## 2015-05-14 DIAGNOSIS — Z515 Encounter for palliative care: Secondary | ICD-10-CM

## 2015-05-14 DIAGNOSIS — J90 Pleural effusion, not elsewhere classified: Secondary | ICD-10-CM

## 2015-05-14 DIAGNOSIS — J45909 Unspecified asthma, uncomplicated: Secondary | ICD-10-CM

## 2015-05-14 LAB — GLUCOSE, CAPILLARY
GLUCOSE-CAPILLARY: 103 mg/dL — AB (ref 65–99)
GLUCOSE-CAPILLARY: 113 mg/dL — AB (ref 65–99)
GLUCOSE-CAPILLARY: 89 mg/dL (ref 65–99)
GLUCOSE-CAPILLARY: 91 mg/dL (ref 65–99)
Glucose-Capillary: 98 mg/dL (ref 65–99)
Glucose-Capillary: 94 mg/dL (ref 65–99)
Glucose-Capillary: 81 mg/dL (ref 65–99)

## 2015-05-14 LAB — CBC
HEMATOCRIT: 26.9 % — AB (ref 36.0–46.0)
Hemoglobin: 8.6 g/dL — ABNORMAL LOW (ref 12.0–15.0)
MCH: 30 pg (ref 26.0–34.0)
MCHC: 32 g/dL (ref 30.0–36.0)
MCV: 93.7 fL (ref 78.0–100.0)
Platelets: 262 10*3/uL (ref 150–400)
RBC: 2.87 MIL/uL — ABNORMAL LOW (ref 3.87–5.11)
RDW: 15 % (ref 11.5–15.5)
WBC: 20.2 10*3/uL — ABNORMAL HIGH (ref 4.0–10.5)

## 2015-05-14 LAB — BASIC METABOLIC PANEL
Anion gap: 4 — ABNORMAL LOW (ref 5–15)
BUN: 17 mg/dL (ref 6–20)
CALCIUM: 7.6 mg/dL — AB (ref 8.9–10.3)
CO2: 15 mmol/L — AB (ref 22–32)
Chloride: 123 mmol/L — ABNORMAL HIGH (ref 101–111)
Creatinine, Ser: 1.18 mg/dL — ABNORMAL HIGH (ref 0.44–1.00)
GFR calc Af Amer: 44 mL/min — ABNORMAL LOW (ref >60)
GFR, EST NON AFRICAN AMERICAN: 38 mL/min — AB (ref >60)
GLUCOSE: 90 mg/dL (ref 65–99)
Potassium: 3.7 mmol/L (ref 3.5–5.1)
Sodium: 142 mmol/L (ref 135–145)

## 2015-05-14 MED ORDER — IPRATROPIUM-ALBUTEROL 0.5-2.5 (3) MG/3ML IN SOLN
3.0000 mL | Freq: Four times a day (QID) | RESPIRATORY_TRACT | Status: DC | PRN
  Administered 2015-05-14: 3 mL via RESPIRATORY_TRACT
  Filled 2015-05-14: qty 3

## 2015-05-14 NOTE — Progress Notes (Signed)
Pt c/o SOB and her "chest hurting". VS stable. O2 sats 100 on 2 liters. Pt has expiratory wheezing in lungs, is SOB when she talks. Only had 300 cc of urine from her foley thus far. Notified MD on call. Stat portable chest xray ordered. Will notify MD if abnormal results.

## 2015-05-14 NOTE — Progress Notes (Signed)
Family mtg scheduled with pt's granddaughter Elzabeth Mcquerry at 1pm 05/15/15. Her number is [phone]. Pt is pleasantly confused, post-op today, as well as having end-stage dementia. She is unable to participate in discussion today. Medicine Romona Curls, ANP-ACPHN

## 2015-05-14 NOTE — Consult Note (Addendum)
PULMONARY / CRITICAL CARE MEDICINE   Name: Elizabeth Peterson MRN: [id number] DOB: December 19, 1918    ADMISSION DATE:  05/11/2015 CONSULTATION DATE: 8/18  REFERRING MD : CCS  CHIEF COMPLAINT:  Perfed gastric ulcer  INITIAL PRESENTATION: Abd pain  STUDIES:    SIGNIFICANT EVENTS: 8/18 p lap   HISTORY OF PRESENT ILLNESS:   Post exp lap   SUBJECTIVE: Denies shortness of breath or cough  VITAL SIGNS: Temp:  [98.8 F (37.1 C)-99 F (37.2 C)] 98.9 F (37.2 C) (08/21 1426) Pulse Rate:  [97-117] 117 (08/21 1455) Resp:  [16-18] 18 (08/21 1426) BP: (115-140)/(69-97) 115/87 mmHg (08/21 1455) SpO2:  [99 %-100 %] 100 % (08/21 1455) HEMODYNAMICS:     INTAKE / OUTPUT:  Intake/Output Summary (Last 24 hours) at 05/14/15 1536 Last data filed at 05/14/15 1426  Gross per 24 hour  Intake   3095 ml  Output    700 ml  Net   2395 ml    PHYSICAL EXAMINATION: General:  Frail elderly AAF alert and responsive, pleasant  Neuro:  Oriented to self, not much to circumstances HEENT:  Edentulous, oral mucosa moist, mild jvd  Cardiovascular:  RRR, No MRG Lungs: bilateral unlabored wheeze, decreased in bases Abdomen:  No BS Dressing CDI, ngt with clear drainage    Musculoskeletal:  Contractures of lower ext Skin:  Warm and dry  LABS:  CBC  Recent Labs Lab 05/12/15 0414 05/13/15 0350 05/14/15 1028  WBC 13.7* 18.8* 20.2*  HGB 10.2* 7.8* 8.6*  HCT 32.2* 24.1* 26.9*  PLT PLATELET CLUMPS NOTED ON SMEAR, COUNT APPEARS ADEQUATE 267 262   Coag's No results for input(s): APTT, INR in the last 168 hours. BMET  Recent Labs Lab 05/12/15 0414 05/13/15 0350 05/14/15 1028  NA 141 141 142  K 4.0 3.2* 3.7  CL 114* 121* 123*  CO2 14* 15* 15*  BUN 27* 27* 17  CREATININE 1.57* 1.37* 1.18*  GLUCOSE 24* 91 90   Electrolytes  Recent Labs Lab 05/12/15 0414 05/13/15 0350 05/14/15 1028  CALCIUM 7.4* 7.1* 7.6*  MG  --  1.4*  --   PHOS  --  2.5  --    Sepsis Markers  Recent Labs Lab  05/11/15 0323 05/12/15 0414  LATICACIDVEN 8.31* 3.7*   ABG No results for input(s): PHART, PCO2ART, PO2ART in the last 168 hours. Liver Enzymes  Recent Labs Lab 05/11/15 0305  AST 36  ALT 10*  ALKPHOS 51  BILITOT 2.3*  ALBUMIN 3.4*   Cardiac Enzymes No results for input(s): TROPONINI, PROBNP in the last 168 hours. Glucose  Recent Labs Lab 05/13/15 1521 05/13/15 2020 05/13/15 2359 05/14/15 0408 05/14/15 0801 05/14/15 1237  GLUCAP 79 87 103* 98 94 81    Imaging Dg Chest Port 1 View  05/14/2015   CLINICAL DATA:  Shortness of breath  EXAM: PORTABLE CHEST - 1 VIEW  COMPARISON:  05/12/2015  FINDINGS: Nasogastric tube tip has been removed. Dilated colon again seen in the left abdomen.  Bilateral basilar opacification, greater on the right. Appearance suggests associated pleural fluid. Unchanged cardiopericardial enlargement. Stable upper mediastinal contours.  IMPRESSION: Stable bibasilar lung opacity, greater on the right. Admission abdominal CT favors pleural fluid and atelectasis.   Electronically Signed   By: Monte Fantasia M.D.   On: 05/14/2015 06:14  I reviewed CXR image 8/21   ASSESSMENT / PLAN:  PULMONARY Post-op. Small R pleural effusion and probably trace left, masked by distended splenic flexure colon. Effusions c/w sympathetic after abd surgery.  Asthmatic bronchitis- watch for ddx fluid overloead A: P:   O2 as needed, follow cxr  Nebs ordered  CARDIOVASCULAR CVL A:  Hx of HTN P:  Hold antihypertensive for now Gentle hydration  RENAL Lab Results  Component Value Date   CREATININE 1.18* 05/14/2015   CREATININE 1.37* 05/13/2015   CREATININE 1.57* 05/12/2015    A:  Concern for renal injury in 81 yo  post ct scan, on NSAIDS P:   Hydration Follow creatine  GASTROINTESTINAL A:  8/18 post open repair perforated prepyloric ulcer P:   Per CCS  PPI  HEMATOLOGIC  Recent Labs  05/13/15 0350 05/14/15 1028  HGB 7.8* 8.6*    A:  Concern  for blood loss anemia P:  Follow cbc  INFECTIOUS A:   Concern for peritonitis  P:    Abx:  8/18 cipro >> 8/18 flagyl >>  ENDOCRINE A:   No acute issue P:     NEUROLOGIC A:  Dementia P:   RASS goal: 1 Safety precautions   FAMILY  - Updates: Updated at bedside. - Inter-disciplinary family meet or Palliative Care meeting due by:  day 7    TODAY'S SUMMARY:  81 yo AAF who is wheelchair bound due to OA,  Takes NSIADS for pain. suffers from dementia and HTN and presents to [facility] 8/18 with increased abd pain for several weeks.  Ct of abd revealed perforation and she was taken urgently to OR by CCS. She had open repair of perforation and returned to ICU stable and awake 8/18. Due to her advanced age 61 asked PCCM to help with her care.Post op small effusions and wheezing. Nebs ordered prn  CD Annamaria Boots, MD [place] [facility]  after 3pm call: [phone] 05/14/2015, 3:36 PM

## 2015-05-14 NOTE — Progress Notes (Signed)
3 Days Post-Op  Subjective: She reported some chest pain and shortness of breath early this AM but has none now.  Pulled ng out.  Objective: Vital signs in last 24 hours: Temp:  [98 F (36.7 C)-99 F (37.2 C)] 98.8 F (37.1 C) (08/21 0600) Pulse Rate:  [97-124] 104 (08/21 0600) Resp:  [16-30] 16 (08/21 0600) BP: (126-183)/(62-97) 138/91 mmHg (08/21 0600) SpO2:  [97 %-100 %] 100 % (08/21 0600) Last BM Date: 05/11/15  Intake/Output from previous day: 08/20 0701 - 08/21 0700 In: 3950 [I.V.:3050; IV Piggyback:900] Out: 895 [Urine:845; Emesis/NG output:50] Intake/Output this shift:    PE: General- In NAD Lungs-coarse upper airway sounds Abdomen-few bowel sounds, open wound is clean  Lab Results:   Recent Labs  05/12/15 0414 05/13/15 0350  WBC 13.7* 18.8*  HGB 10.2* 7.8*  HCT 32.2* 24.1*  PLT PLATELET CLUMPS NOTED ON SMEAR, COUNT APPEARS ADEQUATE 267   BMET  Recent Labs  05/12/15 0414 05/13/15 0350  NA 141 141  K 4.0 3.2*  CL 114* 121*  CO2 14* 15*  GLUCOSE 24* 91  BUN 27* 27*  CREATININE 1.57* 1.37*  CALCIUM 7.4* 7.1*   PT/INR No results for input(s): LABPROT, INR in the last 72 hours. Comprehensive Metabolic Panel:    Component Value Date/Time   NA 141 05/13/2015 0350   NA 141 05/12/2015 0414   NA 136 04/17/2015 1533   NA 144 12/02/2014 0911   K 3.2* 05/13/2015 0350   K 4.0 05/12/2015 0414   CL 121* 05/13/2015 0350   CL 114* 05/12/2015 0414   CO2 15* 05/13/2015 0350   CO2 14* 05/12/2015 0414   BUN 27* 05/13/2015 0350   BUN 27* 05/12/2015 0414   BUN 13 04/17/2015 1533   BUN 8* 12/02/2014 0911   CREATININE 1.37* 05/13/2015 0350   CREATININE 1.57* 05/12/2015 0414   GLUCOSE 91 05/13/2015 0350   GLUCOSE 24* 05/12/2015 0414   GLUCOSE 101* 04/17/2015 1533   GLUCOSE 91 12/02/2014 0911   CALCIUM 7.1* 05/13/2015 0350   CALCIUM 7.4* 05/12/2015 0414   AST 36 05/11/2015 0305   AST 17 12/02/2014 0911   ALT 10* 05/11/2015 0305   ALT 7 12/02/2014 0911    ALKPHOS 51 05/11/2015 0305   ALKPHOS 63 12/02/2014 0911   BILITOT 2.3* 05/11/2015 0305   BILITOT 0.4 12/02/2014 0911   BILITOT 0.5 10/18/2013 1006   PROT 7.3 05/11/2015 0305   PROT 6.8 12/02/2014 0911   PROT 7.4 10/18/2013 1006   PROT 7.9 02/28/2013 1818   ALBUMIN 3.4* 05/11/2015 0305   ALBUMIN 3.0* 02/28/2013 1818     Studies/Results: Dg Chest Port 1 View  05/14/2015   CLINICAL DATA:  Shortness of breath  EXAM: PORTABLE CHEST - 1 VIEW  COMPARISON:  05/12/2015  FINDINGS: Nasogastric tube tip has been removed. Dilated colon again seen in the left abdomen.  Bilateral basilar opacification, greater on the right. Appearance suggests associated pleural fluid. Unchanged cardiopericardial enlargement. Stable upper mediastinal contours.  IMPRESSION: Stable bibasilar lung opacity, greater on the right. Admission abdominal CT favors pleural fluid and atelectasis.   Electronically Signed   By: Monte Fantasia M.D.   On: 05/14/2015 06:14    Anti-infectives: Anti-infectives    Start     Dose/Rate Route Frequency Ordered Stop   05/13/15 0600  ciprofloxacin (CIPRO) IVPB 400 mg     400 mg 200 mL/hr over 60 Minutes Intravenous Every 24 hours 05/12/15 0757 05/20/15 0559   05/12/15 0800  metroNIDAZOLE (FLAGYL)  IVPB 500 mg     500 mg 100 mL/hr over 60 Minutes Intravenous Every 8 hours 05/12/15 0725 05/19/15 0759   05/12/15 0730  ciprofloxacin (CIPRO) IVPB 400 mg  Status:  Discontinued     400 mg 200 mL/hr over 60 Minutes Intravenous Every 12 hours 05/12/15 0725 05/12/15 0756   05/12/15 0600  ciprofloxacin (CIPRO) IVPB 400 mg     400 mg 200 mL/hr over 60 Minutes Intravenous Every 24 hours 05/11/15 1118 05/12/15 0648   05/11/15 1100  metroNIDAZOLE (FLAGYL) IVPB 500 mg     500 mg 100 mL/hr over 60 Minutes Intravenous Every 8 hours 05/11/15 1036 05/11/15 1227   05/11/15 1045  ciprofloxacin (CIPRO) IVPB 400 mg  Status:  Discontinued     400 mg 200 mL/hr over 60 Minutes Intravenous Every 12 hours  05/11/15 1036 05/11/15 1117   05/11/15 0515  ciprofloxacin (CIPRO) IVPB 400 mg     400 mg 200 mL/hr over 60 Minutes Intravenous  Once 05/11/15 0503 05/11/15 0656   05/11/15 0515  metroNIDAZOLE (FLAGYL) IVPB 500 mg     500 mg 100 mL/hr over 60 Minutes Intravenous  Once 05/11/15 0503 05/11/15 8850      Assessment   Gastric ulcer with perforation s/p open repair (Dr. Marcello Moores) 05/11/15-she pulled ng out last night; wound okay   Anemia   Alzheimer's disease         LOS: 3 days   Plan: Leave ng tube out for now.  PT/OT consults.  Check lab today.   Octavian Godek Lenna Sciara 05/14/2015

## 2015-05-15 DIAGNOSIS — I10 Essential (primary) hypertension: Secondary | ICD-10-CM

## 2015-05-15 DIAGNOSIS — D649 Anemia, unspecified: Secondary | ICD-10-CM

## 2015-05-15 DIAGNOSIS — K251 Acute gastric ulcer with perforation: Secondary | ICD-10-CM

## 2015-05-15 LAB — URINALYSIS, ROUTINE W REFLEX MICROSCOPIC
Bilirubin Urine: NEGATIVE
GLUCOSE, UA: NEGATIVE mg/dL
HGB URINE DIPSTICK: NEGATIVE
KETONES UR: NEGATIVE mg/dL
LEUKOCYTES UA: NEGATIVE
Nitrite: NEGATIVE
PH: 5.5 (ref 5.0–8.0)
PROTEIN: NEGATIVE mg/dL
Specific Gravity, Urine: 1.02 (ref 1.005–1.030)
Urobilinogen, UA: 1 mg/dL (ref 0.0–1.0)

## 2015-05-15 LAB — GLUCOSE, CAPILLARY
GLUCOSE-CAPILLARY: 95 mg/dL (ref 65–99)
GLUCOSE-CAPILLARY: 93 mg/dL (ref 65–99)
GLUCOSE-CAPILLARY: 106 mg/dL — AB (ref 65–99)
Glucose-Capillary: 88 mg/dL (ref 65–99)
Glucose-Capillary: 96 mg/dL (ref 65–99)

## 2015-05-15 LAB — AMMONIA: Ammonia: 21 umol/L (ref 9–35)

## 2015-05-15 LAB — TSH: TSH: 3.488 u[IU]/mL (ref 0.350–4.500)

## 2015-05-15 MED ORDER — ALBUTEROL SULFATE (2.5 MG/3ML) 0.083% IN NEBU
2.5000 mg | INHALATION_SOLUTION | Freq: Two times a day (BID) | RESPIRATORY_TRACT | Status: DC
  Administered 2015-05-15 – 2015-05-22 (×15): 2.5 mg via RESPIRATORY_TRACT
  Filled 2015-05-15 (×14): qty 3

## 2015-05-15 MED ORDER — ALBUTEROL SULFATE (2.5 MG/3ML) 0.083% IN NEBU
2.5000 mg | INHALATION_SOLUTION | RESPIRATORY_TRACT | Status: DC | PRN
  Administered 2015-05-16: 2.5 mg via RESPIRATORY_TRACT
  Filled 2015-05-15 (×2): qty 3

## 2015-05-15 NOTE — Progress Notes (Addendum)
Physical Therapy Treatment Patient Details Name: Elizabeth Peterson MRN: [id number] DOB: [birth year] Today's Date: 05/15/2015    History of Present Illness LAPAROSCOPY DIAGNOSTIC converted open repair gastric ulcer and omental patch on 8/18.    PT Comments    Assist pt into recliner on today, Lateral scoot from bed to recliner with dropped armrest. Max-total assist for mobility. Pt c/o bil LE pain mainly. Discussed d/c plan-family states pt will return home.   Follow Up Recommendations  Home health PT;Supervision/Assistance - 24 hour (need to consider ambulance transport home)     Equipment Recommendations  Hospital bed    Recommendations for Other Services       Precautions / Restrictions Precautions Precautions: Fall Precaution Comments: abd. incision, knees contracted Restrictions Weight Bearing Restrictions: No    Mobility  Bed Mobility Overal bed mobility: Needs Assistance Bed Mobility: Supine to Sit     Supine to sit: HOB elevated;Max assist     General bed mobility comments: Assist for trunk to upright and bil LEs off bed. Utilized bedpad for positioning. Increased time. Pt c/o bil LE pain mainly. Multimodal cues for participation.  Transfers Overall transfer level: Needs assistance   Transfers: Lateral/Scoot Transfers          Lateral/Scoot Transfers: Total assist;+2 physical assistance General transfer comment: Lateral scoot, bed to recliner, with dropped arm. Increased time.   Ambulation/Gait             General Gait Details: NT-pt non ambulatory   Stairs            Wheelchair Mobility    Modified Rankin (Stroke Patients Only)       Balance Overall balance assessment: Needs assistance Sitting-balance support: Bilateral upper extremity supported;Feet supported Sitting balance-Leahy Scale: Fair                              Cognition Arousal/Alertness: Awake/alert Behavior During Therapy: WFL for tasks  assessed/performed Overall Cognitive Status: History of cognitive impairments - at baseline                      Exercises      General Comments        Pertinent Vitals/Pain Pain Assessment: Faces Faces Pain Scale: Hurts whole lot Pain Location: bil LEs-posteior knee area Pain Descriptors / Indicators: Aching;Grimacing;Guarding;Sore Pain Intervention(s): Limited activity within patient's tolerance;Repositioned;RN gave pain meds during session    Home Living                      Prior Function            PT Goals (current goals can now be found in the care plan section) Progress towards PT goals: Progressing toward goals    Frequency  Min 2X/week    PT Plan Frequency needs to be updated    Co-evaluation             End of Session   Activity Tolerance: Patient limited by pain Patient left: in chair;with call bell/phone within reach; with family present     Time: 3785-8850 PT Time Calculation (min) (ACUTE ONLY): 14 min  Charges:  $Therapeutic Activity: 8-22 mins                    G Codes:      Elizabeth Peterson, MPT Pager: [phone]

## 2015-05-15 NOTE — Progress Notes (Signed)
4 Days Post-Op  Subjective: She is very confused, year 81, she is in hospital with a cold. She is 62 something she isn't sure.  She isn't complaining much about pain.  No orders for dressing changes, dressing is dry.    Objective: Vital signs in last 24 hours: Temp:  [98.4 F (36.9 C)-99 F (37.2 C)] 98.4 F (36.9 C) (08/22 0600) Pulse Rate:  [90-117] 100 (08/22 0600) Resp:  [18] 18 (08/22 0600) BP: (91-133)/(66-87) 128/66 mmHg (08/22 0600) SpO2:  [98 %-100 %] 98 % (08/22 0600) Last BM Date: 05/11/15 NPO Urine 875 Afebrile, mild tachycardia, (admit EKG- Sinus Tachycardia) mild BP variation No labs today WBC up yesterday, H/H is decreased  CXR yesterday, show bibasilar opacity, favor effusion and atelectasis Intake/Output from previous day: 08/21 0701 - 08/22 0700 In: 2002.5 [I.V.:2002.5] Out: 875 [Urine:875] Intake/Output this shift:    General appearance: alert, cooperative, no distress and no idea where she is or why she is here.  Mittens at bedside. Resp: wheezes bilateral, decreased some in base with some rales. GI: soft, few BS, wound is clean, but not packed.  No BM, she cannot tell me about flatus   Lab Results:   Recent Labs  05/13/15 0350 05/14/15 1028  WBC 18.8* 20.2*  HGB 7.8* 8.6*  HCT 24.1* 26.9*  PLT 267 262    BMET  Recent Labs  05/13/15 0350 05/14/15 1028  NA 141 142  K 3.2* 3.7  CL 121* 123*  CO2 15* 15*  GLUCOSE 91 90  BUN 27* 17  CREATININE 1.37* 1.18*  CALCIUM 7.1* 7.6*   PT/INR No results for input(s): LABPROT, INR in the last 72 hours.   Recent Labs Lab 05/11/15 0305  AST 36  ALT 10*  ALKPHOS 51  BILITOT 2.3*  PROT 7.3  ALBUMIN 3.4*     Lipase     Component Value Date/Time   LIPASE 441* 05/11/2015 0305     Studies/Results: Dg Chest Port 1 View  05/14/2015   CLINICAL DATA:  Shortness of breath  EXAM: PORTABLE CHEST - 1 VIEW  COMPARISON:  05/12/2015  FINDINGS: Nasogastric tube tip has been removed. Dilated colon  again seen in the left abdomen.  Bilateral basilar opacification, greater on the right. Appearance suggests associated pleural fluid. Unchanged cardiopericardial enlargement. Stable upper mediastinal contours.  IMPRESSION: Stable bibasilar lung opacity, greater on the right. Admission abdominal CT favors pleural fluid and atelectasis.   Electronically Signed   By: Monte Fantasia M.D.   On: 05/14/2015 06:14    Medications: . antiseptic oral rinse  7 mL Mouth Rinse q12n4p  . chlorhexidine  15 mL Mouth Rinse BID  . ciprofloxacin  400 mg Intravenous Q24H  . heparin  5,000 Units Subcutaneous 3 times per day  . metronidazole  500 mg Intravenous Q8H  . pantoprazole (PROTONIX) IV  80 mg Intravenous Q12H   . dextrose 5 % and 0.9% NaCl 75 mL/hr at 05/15/15 0415   Prior to Admission medications   Medication Sig Start Date End Date Taking? Authorizing Provider  acetaminophen (TYLENOL) 650 MG CR tablet Take 650 mg by mouth as needed for pain.   Yes Historical Provider, MD  aspirin 81 MG EC tablet Take 81 mg by mouth daily.     Yes Historical Provider, MD  gabapentin (NEURONTIN) 100 MG capsule Take 1 capsule (100 mg total) by mouth 3 (three) times daily. 06/17/12  Yes Biagio Borg, MD  hydrALAZINE (APRESOLINE) 50 MG tablet Take one tablet  by mouth four times daily for blood pressure 01/23/15  Yes Tiffany L Reed, DO  Memantine HCl ER 28 MG CP24 Take 28 mg by mouth daily. 04/18/14  Yes Tiffany L Reed, DO  naproxen sodium (ANAPROX) 220 MG tablet Take 440 mg by mouth 2 (two) times daily as needed (pain).   Yes Historical Provider, MD  vitamin B-12 (CYANOCOBALAMIN) 1000 MCG tablet 2 by mouth daily   Yes Historical Provider, MD  AMBULATORY NON FORMULARY MEDICATION Wheelchair and Cushion 10/18/14   Tiffany L Reed, DO  cetirizine (ZYRTEC) 10 MG tablet Take 1 tablet (10 mg total) by mouth daily. 06/02/14   Tiffany L Reed, DO  mirtazapine (REMERON) 15 MG tablet TAKE 1 TABLET BY MOUTH EVERY DAY AT BEDTIME Patient not  taking: Reported on 05/11/2015 04/18/14   Tiffany L Reed, DO  Tdap (BOOSTRIX) 5-2.5-18.5 LF-MCG/0.5 injection Inject 0.5 mLs into the muscle once. 04/17/15   Tiffany L Reed, DO  traMADol (ULTRAM) 50 MG tablet Take 1 tablet (50 mg total) by mouth 4 (four) times daily as needed for moderate pain. 09/01/14   Gayland Curry, DO     Assessment/Plan Perforated Prepyloric gastric ulcer S/p LAPAROSCOPY DIAGNOSTIC converted open repair gastric ulcer and omental patch, 03/07/36, Dr. Leighton Ruff; POD 4 Chronic knee pain/osteoarthritis previously on NSAID - Wheelchair bound at home Acute kidney injury Mild dementia Hypertension Ongoing weight loss/PCM 04/17/15 per PCP Hypertension Hx of B12 deficiency Hypokalemia Hx of GERD Hx of breast cancer, with left mastectomy Antibiotics:  Day 6 Cipro/Flagyl DVT:  Heparin/SCD   Plan:  She has CCM seeing her for assistance with pulmonary issues.  Palliative care is also ask to seen and will discuss issues with family later today.  I am going to leave foley today, recheck labs in AM. If WBC is up we may need to repeat CT scan, I will type tomorrow also.  Discuss Medicine consult with Dr. Zella Richer also.  I will discuss PO's with him after he has had a chance to see her.  Dressings wet to Dry, she needs the mittens she has pulled out 3 IV's so far.  I will get her up to chair, but I don't think she will be able to Do IS.    LOS: 4 days    Sergio Zawislak 05/15/2015

## 2015-05-15 NOTE — Progress Notes (Signed)
PULMONARY / CRITICAL CARE MEDICINE   Name: Kaly Mcquary MRN: [id number] DOB: 16-Jul-1919    ADMISSION DATE:  05/11/2015 CONSULTATION DATE: 8/18  REFERRING MD : [facility]  CHIEF COMPLAINT:  Perfed gastric ulcer  INITIAL PRESENTATION: 81 y/o female with dementia.  Abd pain> perforated gastric ulcer.  She had an open repair on 8/18.    STUDIES:    SIGNIFICANT EVENTS: 8/18 p lap   HISTORY OF PRESENT ILLNESS:   Post exp lap   SUBJECTIVE: complains of abdominal pain  VITAL SIGNS: Temp:  [98.4 F (36.9 C)-99 F (37.2 C)] 98.4 F (36.9 C) (08/22 0600) Pulse Rate:  [90-117] 100 (08/22 0600) Resp:  [18] 18 (08/22 0600) BP: (91-133)/(66-87) 128/66 mmHg (08/22 0600) SpO2:  [98 %-100 %] 98 % (08/22 0600) HEMODYNAMICS:     INTAKE / OUTPUT:  Intake/Output Summary (Last 24 hours) at 05/15/15 1152 Last data filed at 05/15/15 1000  Gross per 24 hour  Intake 2698.33 ml  Output   1000 ml  Net 1698.33 ml    PHYSICAL EXAMINATION: General:  Frail elderly female in no distress HENT: NCAT OP clear PULM: few wheeze left upper lobe, diminished R base CV: RRR, S1/S2 GI: BS+, soft, nontneder MSK: normal bulk and tone Neuro: Awake, confused but conversant  LABS:  CBC  Recent Labs Lab 05/12/15 0414 05/13/15 0350 05/14/15 1028  WBC 13.7* 18.8* 20.2*  HGB 10.2* 7.8* 8.6*  HCT 32.2* 24.1* 26.9*  PLT PLATELET CLUMPS NOTED ON SMEAR, COUNT APPEARS ADEQUATE 267 262   Coag's No results for input(s): APTT, INR in the last 168 hours. BMET  Recent Labs Lab 05/12/15 0414 05/13/15 0350 05/14/15 1028  NA 141 141 142  K 4.0 3.2* 3.7  CL 114* 121* 123*  CO2 14* 15* 15*  BUN 27* 27* 17  CREATININE 1.57* 1.37* 1.18*  GLUCOSE 24* 91 90   Electrolytes  Recent Labs Lab 05/12/15 0414 05/13/15 0350 05/14/15 1028  CALCIUM 7.4* 7.1* 7.6*  MG  --  1.4*  --   PHOS  --  2.5  --    Sepsis Markers  Recent Labs Lab 05/11/15 0323 05/12/15 0414  LATICACIDVEN 8.31* 3.7*    ABG No results for input(s): PHART, PCO2ART, PO2ART in the last 168 hours. Liver Enzymes  Recent Labs Lab 05/11/15 0305  AST 36  ALT 10*  ALKPHOS 51  BILITOT 2.3*  ALBUMIN 3.4*   Cardiac Enzymes No results for input(s): TROPONINI, PROBNP in the last 168 hours. Glucose  Recent Labs Lab 05/14/15 1237 05/14/15 1603 05/14/15 2007 05/14/15 2342 05/15/15 0414 05/15/15 0743  GLUCAP 81 91 113* 89 95 93    Imaging CXR from 8/21 reviwed> modearate R effusion   ASSESSMENT / PLAN:  PULMONARY Moderate R sided pleural effusion > likely sympathetic, likely contributing to dyspnea Asthmatic bronchitis > improved with albuterol; agree ddx fluid overload but has history of asthma and now better with albuterol A: P:   O2 as needed to maintain O2 sat > 92% Plan on albuterol bid and q4h prn for about a week, then prn, will need albuterol and a nebulizer machine at home I will ask IR to perform a R sided thoracentesis with appropriate labs    FAMILY  - Updates: Updated Granddaughter at bedside. - Inter-disciplinary family meet or Palliative Care meeting due by:  day [place], MD [facility] Pager: [phone] Cell: [phone] After 3pm or if no response, call [phone]

## 2015-05-15 NOTE — Consult Note (Signed)
Medical Consultation   Elizabeth Peterson  EAV:[id number]  DOB: 12/31/1918  DOA: 05/11/2015  PCP: Hollace Kinnier, DO  Requesting physician: Gen surgery  Reason for consultation: Medical comanagement  History of Present Illness: Patient is a 81 year old female with hypertension, hyperlipidemia, dementia (confirmed with granddaughter at the bedside), GERD, anemi, wheelchair-bound due to osteoarthritis a had originally presented on 8/18 to the ED with moderate generalized abdominal pain for several weeks which worsened her day before the admission. She also had associated nausea and vomiting on the day of admission. Patient had significant tenderness with rebound and guarding and was found to have perforated gastric ulcer. Patient underwent laparoscopic repair of the gastric ulcer and an omental patch. Postoperative care was managed by critical care service, postop period was complicated by ongoing hypotension, acute kidney injury, worsening mental status superimposed on dementia. She was also noticed to have small pleural effusion with wheezing.   Palliative medicine was also consulted for goals of care. Internal medicine consulted today for medical comanagement.  Allergies:   Allergies  Allergen Reactions  . Amlodipine Besylate     REACTION: rash and itch  . Benzodiazepines Other (See Comments)    Hx of overuse/memory issue  . Penicillins   . Sulfonamide Derivatives       Past Medical History  Diagnosis Date  . HYPERLIPIDEMIA 04/16/2007  . ANEMIA-NOS 04/16/2007  . ANXIETY 04/16/2007  . DEPRESSION 09/03/2007  . HYPERTENSION 04/16/2007  . ATHEROSLERO NATV ART EXTREM W/INTERMIT CLAUDICAT 06/05/2010  . ALLERGIC RHINITIS 02/27/2009  . PNEUMONIA, COMMUNITY ACQUIRED, PNEUMOCOCCAL 12/07/2008  . ASTHMA 09/03/2007  . GERD 04/16/2007  . DIVERTICULOSIS, COLON 09/03/2007  . MENOPAUSAL DISORDER 04/18/2008  . OSTEOARTHRITIS, KNEE, LEFT 11/24/2009  . JOINT EFFUSION, LEFT KNEE 02/27/2009  . KNEE PAIN,  BILATERAL 06/01/2010  . LOW BACK PAIN 04/16/2007  . LEG PAIN, LEFT 06/01/2010  . WEIGHT LOSS 07/25/2008  . RASH-NONVESICULAR 07/25/2008  . Abdominal pain, generalized 09/03/2007  . BREAST CANCER, HX OF 04/16/2007  . COLONIC POLYPS, HX OF 09/03/2007  . Dementia 06/17/2012    mild  . Peripheral neuropathy 06/17/2012    Past Surgical History  Procedure Laterality Date  . Abdominal hysterectomy  1966  . Tubal ligation    . S/p left mastectomy  2011  . Laparoscopy N/A 05/11/2015    Procedure: LAPAROSCOPY DIAGNOSTIC converted open repair gastric ulcerand omental patch;  Surgeon: Leighton Ruff, MD;  Location: [facility];  Service: General;  Laterality: N/A;    Social History:  reports that she has never smoked. She has never used smokeless tobacco. She reports that she does not drink alcohol or use illicit drugs.  Family History  Problem Relation Age of Onset  . Cancer Sister     Breast  . Hypertension Other   . Diabetes Other     1st degree relative  . Hypertension Maternal Grandmother     Review of Systems:  Review of Systems:  Constitutional: Denies fever, chills, diaphoresis, appetite change and fatigue.  HEENT: Denies photophobia, eye pain, redness, hearing loss, ear pain, congestion, sore throat, rhinorrhea, sneezing, mouth sores, trouble swallowing, neck pain, neck stiffness and tinnitus.   Respiratory: Denies SOB, DOE, cough, chest tightness,  and wheezing.   Cardiovascular: Denies chest pain, palpitations and leg swelling.  Gastrointestinal: Denies nausea, vomiting, abdominal pain, diarrhea, constipation, blood in stool and abdominal distention.  Genitourinary: Denies dysuria, urgency, frequency, hematuria, flank pain and difficulty urinating.  Musculoskeletal: Denies myalgias, back pain, joint swelling, arthralgias and gait problem.  Skin:  Denies pallor, rash and wound.  Neurological: Denies dizziness, seizures, syncope, weakness, light-headedness, numbness and headaches.   Hematological: Denies adenopathy. Easy bruising, personal or family bleeding history  Psychiatric/Behavioral: Denies suicidal ideation, mood changes, confusion, nervousness, sleep disturbance and agitation   Physical Exam: Blood pressure 127/67, pulse 101, temperature 99 F (37.2 C), temperature source Oral, resp. rate 18, height 5\' 3"  (1.6 m), weight 53.3 kg (117 lb 8.1 oz), SpO2 100 %.  General: Alert and awake, oriented x2, not in any acute distress, pleasant and cooperative, granddaughter at the bedside. HEENT: normocephalic, atraumatic, anicteric sclera, pupils reactive to light and accommodation, EOMI, oropharynx clear CVS: S1-S2 clear, no murmur rubs or gallops Chest: Decreased breath sounds at the bases  Abdomen:Dressing intact  Extremities: no cyanosis, clubbing or edema noted bilaterally Neuro: Cranial nerves II-XII intact, no focal neurological deficits Psych: alert and oriented, stable mood and affect Skin: no rashes or lesions  Labs on Admission:  Basic Metabolic Panel:  Recent Labs Lab 05/13/15 0350 05/14/15 1028  NA 141 142  K 3.2* 3.7  CL 121* 123*  CO2 15* 15*  GLUCOSE 91 90  BUN 27* 17  CREATININE 1.37* 1.18*  CALCIUM 7.1* 7.6*  MG 1.4*  --   PHOS 2.5  --    Liver Function Tests:  Recent Labs Lab 05/11/15 0305  AST 36  ALT 10*  ALKPHOS 51  BILITOT 2.3*  PROT 7.3  ALBUMIN 3.4*    Recent Labs Lab 05/11/15 0305  LIPASE 441*   No results for input(s): AMMONIA in the last 168 hours. CBC:  Recent Labs Lab 05/13/15 0350 05/14/15 1028  WBC 18.8* 20.2*  HGB 7.8* 8.6*  HCT 24.1* 26.9*  MCV 92.3 93.7  PLT 267 262   Cardiac Enzymes: No results for input(s): CKTOTAL, CKMB, CKMBINDEX, TROPONINI in the last 168 hours. BNP: Invalid input(s): POCBNP CBG:  Recent Labs Lab 05/14/15 2007 05/14/15 2342 05/15/15 0414 05/15/15 0743 05/15/15 1205  GLUCAP 113* 89 95 93 88    Inpatient Medications:   Scheduled Meds: . albuterol  2.5 mg  Nebulization Q12H  . antiseptic oral rinse  7 mL Mouth Rinse q12n4p  . chlorhexidine  15 mL Mouth Rinse BID  . ciprofloxacin  400 mg Intravenous Q24H  . heparin  5,000 Units Subcutaneous 3 times per day  . metronidazole  500 mg Intravenous Q8H  . pantoprazole (PROTONIX) IV  80 mg Intravenous Q12H   Continuous Infusions: . dextrose 5 % and 0.9% NaCl 75 mL/hr at 05/15/15 0415     Radiological Exams on Admission: Dg Chest Port 1 View  05/14/2015   CLINICAL DATA:  Shortness of breath  EXAM: PORTABLE CHEST - 1 VIEW  COMPARISON:  05/12/2015  FINDINGS: Nasogastric tube tip has been removed. Dilated colon again seen in the left abdomen.  Bilateral basilar opacification, greater on the right. Appearance suggests associated pleural fluid. Unchanged cardiopericardial enlargement. Stable upper mediastinal contours.  IMPRESSION: Stable bibasilar lung opacity, greater on the right. Admission abdominal CT favors pleural fluid and atelectasis.   Electronically Signed   By: Monte Fantasia M.D.   On: 05/14/2015 06:14    Impression/Recommendations Active Problems: Gastric ulcer with perforation and open repair - Management per the primary team  - NPO , continue IV PPI  Altered mental status with underlying Alzheimer's dementia, oriented 2  - Per the granddaughter at the bedside, her mental status has been steadily improving, holding off on CT head for now - Noted that palliative medicine has  been consulted and goals of care to be addressed. - UA negative for any UTI, rule out any infectious etiology, ordered blood cultures, B12, folate, TSH, RPR  Leukocytosis - WBC count has been trending up, UA negative for UTI, chest x-ray with lung opacity greater on the right favors fluid and atelectasis - Ordered blood cultures, holding off on antibiotics until source clear, Tmax 99    Pleural effusion, moderate right-sided, Asthmatic bronchitis - Continue scheduled nebs, IR for right-sided thoracentesis    Hypertension - Currently BP stable, not on any antihypertensives  Anemia Likely due to #1, transfuse for hemoglobin less than 7.5, currently stable  [facility] medicine service will follow the patient, thank you for this consultation.  Time Spent  25mins   Madalee Altmann M.D. [facility] 05/15/2015, 2:34 PM

## 2015-05-15 NOTE — Consult Note (Signed)
Consultation Note Date: 05/15/2015   Patient Name: Elizabeth Peterson  DOB: 1976/08/09  MRN: [id number]  Age / Sex: 81 y.o., female   PCP: Gayland Curry, DO Referring Physician: Nolon Nations, MD  Reason for Consultation: Establishing goals of care with advanced dementia  Palliative Care Assessment and Plan Summary of Established Goals of Care and Medical Treatment Preferences    Palliative Care Discussion Held Today:   I met today with Ms. Sievert and her granddaughter, Olin Hauser. Olin Hauser has lived with Ms. Montufar for 3 years to help care for her (Ms. Opiela's 90 yo sister also lives with them but does not have dementia). Olin Hauser says that she does transfer herself from couch to [facility] (sleeps on the couch) mostly on her own but is otherwise immobile. She has been continent of urine and stool. Ms. Mahler repeats herself often during our conversation and is unable to follow with conversation but is pleasantly confused and cooperative - she stops pulling at IV when asked and follows commands. Discussed dementia and the expectations and trajectory - Olin Hauser says she had not been aware of the disease trajectory. Acknowledges decreased appetite at times but no pocketing, spitting out food, or signs of aspiration at this point. Also discussed code status and Olin Hauser says that she and her family have been discussing these options and hope to soon have a decision - she was not interested in discussing this further today. Also discussed signs of worsening dementia and hospice option and why this could be helpful to Olin Hauser (at this point I do not believe she is hospice eligible - unless she declines). All questions/concerns addressed. I will continue to follow.    Contacts/Participants in Discussion: Primary Decision Maker: Granddaughter Olin Hauser is primary caregiver although Ms. Shinault has children they live in [place] and [place], 2 are deceased, and 2 have cerebral palsy and have their own health issues.   Goals of  Care/Code Status/Advance Care Planning:   Code Status: FULL - they are considering options.    Symptom Management:   Right leg pain: Continue fentanyl prn for now as she is unable to take po d/t recent abd surgery.  Sleep: When taking po recommend considering trial trazodone 25 mg qhs.   Psycho-social/Spiritual:   Support System: Olin Hauser is main support but has little support for herself.   Prognosis: Unable to determine.   Discharge Planning: Home with granddaughter when stable.        Chief Complaint/HPI: 81 yo female with PMH of dementia, chronic low back pain, bilat knee pain, anxiety, depression, h/o breast cancer, h/o colonic polyps. Admitted with worsening abdominal pain with N/V and taken to OR emergently for perforated gastric ulcer with concern for peritonitis and renal function trending better.   Primary Diagnoses  Present on Admission:  . Gastric ulcer with perforation . Anemia . Essential hypertension . KNEE PAIN, BILATERAL . Alzheimer's disease  Palliative Review of Systems:   + right leg pain chronic neuropathic pain (did not tolerate gabapentin)   I have reviewed the medical record, interviewed the patient and family, and examined the patient. The following aspects are pertinent.  Past Medical History  Diagnosis Date  . HYPERLIPIDEMIA 04/16/2007  . ANEMIA-NOS 04/16/2007  . ANXIETY 04/16/2007  . DEPRESSION 09/03/2007  . HYPERTENSION 04/16/2007  . ATHEROSLERO NATV ART EXTREM W/INTERMIT CLAUDICAT 06/05/2010  . ALLERGIC RHINITIS 02/27/2009  . PNEUMONIA, COMMUNITY ACQUIRED, PNEUMOCOCCAL 12/07/2008  . ASTHMA 09/03/2007  . GERD 04/16/2007  . DIVERTICULOSIS, COLON 09/03/2007  . MENOPAUSAL DISORDER 04/18/2008  .  OSTEOARTHRITIS, KNEE, LEFT 11/24/2009  . JOINT EFFUSION, LEFT KNEE 02/27/2009  . KNEE PAIN, BILATERAL 06/01/2010  . LOW BACK PAIN 04/16/2007  . LEG PAIN, LEFT 06/01/2010  . WEIGHT LOSS 07/25/2008  . RASH-NONVESICULAR 07/25/2008  . Abdominal pain, generalized  09/03/2007  . BREAST CANCER, HX OF 04/16/2007  . COLONIC POLYPS, HX OF 09/03/2007  . Dementia 06/17/2012    mild  . Peripheral neuropathy 06/17/2012   Social History   Social History  . Marital Status: Widowed    Spouse Name: N/A  . Number of Children: 6  . Years of Education: N/A   Occupational History  . Retired Nurse, adult work    Social History Main Topics  . Smoking status: Never Smoker   . Smokeless tobacco: Never Used  . Alcohol Use: No  . Drug Use: No  . Sexual Activity: Not Asked   Other Topics Concern  . None   Social History Narrative   Lives with her sister.  Ambulates with a cane, but has not been able to ambulate over the past 2 weeks.   Family History  Problem Relation Age of Onset  . Cancer Sister     Breast  . Hypertension Other   . Diabetes Other     1st degree relative  . Hypertension Maternal Grandmother    Scheduled Meds: . antiseptic oral rinse  7 mL Mouth Rinse q12n4p  . chlorhexidine  15 mL Mouth Rinse BID  . ciprofloxacin  400 mg Intravenous Q24H  . heparin  5,000 Units Subcutaneous 3 times per day  . metronidazole  500 mg Intravenous Q8H  . pantoprazole (PROTONIX) IV  80 mg Intravenous Q12H   Continuous Infusions: . dextrose 5 % and 0.9% NaCl 75 mL/hr at 05/15/15 0415   PRN Meds:.fentaNYL (SUBLIMAZE) injection, ipratropium-albuterol, ondansetron **OR** ondansetron (ZOFRAN) IV Medications Prior to Admission:  Prior to Admission medications   Medication Sig Start Date End Date Taking? Authorizing Provider  acetaminophen (TYLENOL) 650 MG CR tablet Take 650 mg by mouth as needed for pain.   Yes Historical Provider, MD  aspirin 81 MG EC tablet Take 81 mg by mouth daily.     Yes Historical Provider, MD  gabapentin (NEURONTIN) 100 MG capsule Take 1 capsule (100 mg total) by mouth 3 (three) times daily. 06/17/12  Yes Biagio Borg, MD  hydrALAZINE (APRESOLINE) 50 MG tablet Take one tablet by mouth four times daily for blood pressure 01/23/15  Yes  Tiffany L Reed, DO  Memantine HCl ER 28 MG CP24 Take 28 mg by mouth daily. 04/18/14  Yes Tiffany L Reed, DO  naproxen sodium (ANAPROX) 220 MG tablet Take 440 mg by mouth 2 (two) times daily as needed (pain).   Yes Historical Provider, MD  vitamin B-12 (CYANOCOBALAMIN) 1000 MCG tablet 2 by mouth daily   Yes Historical Provider, MD  AMBULATORY NON FORMULARY MEDICATION Wheelchair and Cushion 10/18/14   Tiffany L Reed, DO  cetirizine (ZYRTEC) 10 MG tablet Take 1 tablet (10 mg total) by mouth daily. 06/02/14   Tiffany L Reed, DO  mirtazapine (REMERON) 15 MG tablet TAKE 1 TABLET BY MOUTH EVERY DAY AT BEDTIME Patient not taking: Reported on 05/11/2015 04/18/14   Tiffany L Reed, DO  Tdap (BOOSTRIX) 5-2.5-18.5 LF-MCG/0.5 injection Inject 0.5 mLs into the muscle once. 04/17/15   Tiffany L Reed, DO  traMADol (ULTRAM) 50 MG tablet Take 1 tablet (50 mg total) by mouth 4 (four) times daily as needed for moderate pain. 09/01/14   Tiffany L  Reed, DO   Allergies  Allergen Reactions  . Amlodipine Besylate     REACTION: rash and itch  . Benzodiazepines Other (See Comments)    Hx of overuse/memory issue  . Penicillins   . Sulfonamide Derivatives    CBC:    Component Value Date/Time   WBC 20.2* 05/14/2015 1028   WBC 8.3 04/17/2015 1533   WBC 5.6 03/22/2011 1450   HGB 8.6* 05/14/2015 1028   HGB 11.2* 03/22/2011 1450   HCT 26.9* 05/14/2015 1028   HCT 30.3* 04/17/2015 1533   HCT 33.2* 03/22/2011 1450   PLT 262 05/14/2015 1028   PLT 230 03/22/2011 1450   MCV 93.7 05/14/2015 1028   MCV 91.9 03/22/2011 1450   NEUTROABS 4.5 04/17/2015 1533   NEUTROABS 5.0 02/28/2013 1535   NEUTROABS 3.0 03/22/2011 1450   LYMPHSABS 2.5 04/17/2015 1533   LYMPHSABS 1.2 02/28/2013 1535   LYMPHSABS 1.8 03/22/2011 1450   MONOABS 1.0 02/28/2013 1535   MONOABS 0.6 03/22/2011 1450   EOSABS 0.5* 12/02/2014 0911   EOSABS 0.5 02/28/2013 1535   BASOSABS 0.1 04/17/2015 1533   BASOSABS 0.1 02/28/2013 1535   BASOSABS 0.0 03/22/2011  1450   Comprehensive Metabolic Panel:    Component Value Date/Time   NA 142 05/14/2015 1028   NA 136 04/17/2015 1533   K 3.7 05/14/2015 1028   CL 123* 05/14/2015 1028   CO2 15* 05/14/2015 1028   BUN 17 05/14/2015 1028   BUN 13 04/17/2015 1533   CREATININE 1.18* 05/14/2015 1028   GLUCOSE 90 05/14/2015 1028   GLUCOSE 101* 04/17/2015 1533   CALCIUM 7.6* 05/14/2015 1028   AST 36 05/11/2015 0305   ALT 10* 05/11/2015 0305   ALKPHOS 51 05/11/2015 0305   BILITOT 2.3* 05/11/2015 0305   BILITOT 0.4 12/02/2014 0911   PROT 7.3 05/11/2015 0305   PROT 6.8 12/02/2014 0911   ALBUMIN 3.4* 05/11/2015 0305    Physical Exam:  Vital Signs: BP 128/66 mmHg  Pulse 100  Temp(Src) 98.4 F (36.9 C) (Oral)  Resp 18  Ht $R'5\' 3"'jG$  (1.6 m)  Wt 53.3 kg (117 lb 8.1 oz)  BMI 20.82 kg/m2  SpO2 98% SpO2: SpO2: 98 % O2 Device: O2 Device: Not Delivered O2 Flow Rate: O2 Flow Rate (L/min): 2 L/min Intake/output summary:  Intake/Output Summary (Last 24 hours) at 05/15/15 1001 Last data filed at 05/15/15 0700  Gross per 24 hour  Intake 2002.5 ml  Output    875 ml  Net 1127.5 ml   LBM: Last BM Date: 05/11/15 Baseline Weight: Weight: 53.3 kg (117 lb 8.1 oz) Most recent weight: Weight: 53.3 kg (117 lb 8.1 oz)  Exam Findings:   General: NAD, elderly, thin, frail, pleasant, smiling/laughin HEENT: [place]/AT, temporal muscle wasting CVS: RRR Resp: No labored breathing Abd: Dressing CDI Neuro: Awake, alert, oriented to person/place          Palliative Performance Scale: 30 %                Additional Data Reviewed: Recent Labs     05/13/15  0350  05/14/15  1028  WBC  18.8*  20.2*  HGB  7.8*  8.6*  PLT  267  262  NA  141  142  BUN  27*  17  CREATININE  1.37*  1.18*     Time In: 1300 Time Out: 1430 Time Total: 72min  Greater than 50%  of this time was spent counseling and coordinating care related to the above assessment and  plan.   Signed by:  Vinie Sill, NP Palliative Medicine  Team Pager # [phone] (M-F 8a-5p) Team Phone # [phone] (Nights/Weekends)

## 2015-05-15 NOTE — Progress Notes (Signed)
OT Cancellation Note  Patient Details Name: Elizabeth Peterson MRN: [id number] DOB: Jul 07, 1919   Cancelled Treatment:    Reason Eval/Treat Not Completed: Other (comment) Noted pt very confused and palliative consult pending.  Will await results of meeting. Will recheck pt next day to see results of meeting.   Kari Baars, [place]   Payton Mccallum D 05/15/2015, 1:28 PM

## 2015-05-16 ENCOUNTER — Inpatient Hospital Stay (HOSPITAL_COMMUNITY): Payer: Medicare Other

## 2015-05-16 DIAGNOSIS — K259 Gastric ulcer, unspecified as acute or chronic, without hemorrhage or perforation: Secondary | ICD-10-CM

## 2015-05-16 LAB — GLUCOSE, CAPILLARY
GLUCOSE-CAPILLARY: 71 mg/dL (ref 65–99)
GLUCOSE-CAPILLARY: 66 mg/dL (ref 65–99)
GLUCOSE-CAPILLARY: 76 mg/dL (ref 65–99)
GLUCOSE-CAPILLARY: 91 mg/dL (ref 65–99)
Glucose-Capillary: 106 mg/dL — ABNORMAL HIGH (ref 65–99)
Glucose-Capillary: 73 mg/dL (ref 65–99)
Glucose-Capillary: 87 mg/dL (ref 65–99)
Glucose-Capillary: 92 mg/dL (ref 65–99)

## 2015-05-16 LAB — CBC
HEMATOCRIT: 23.6 % — AB (ref 36.0–46.0)
Hemoglobin: 7.9 g/dL — ABNORMAL LOW (ref 12.0–15.0)
MCH: 30.7 pg (ref 26.0–34.0)
MCHC: 33.5 g/dL (ref 30.0–36.0)
MCV: 91.8 fL (ref 78.0–100.0)
PLATELETS: 266 10*3/uL (ref 150–400)
RBC: 2.57 MIL/uL — AB (ref 3.87–5.11)
RDW: 15.2 % (ref 11.5–15.5)
WBC: 11.7 10*3/uL — ABNORMAL HIGH (ref 4.0–10.5)

## 2015-05-16 LAB — HIV ANTIBODY (ROUTINE TESTING W REFLEX): HIV SCREEN 4TH GENERATION: NONREACTIVE

## 2015-05-16 LAB — BASIC METABOLIC PANEL
Anion gap: 9 (ref 5–15)
BUN: 13 mg/dL (ref 6–20)
CHLORIDE: 120 mmol/L — AB (ref 101–111)
CO2: 15 mmol/L — AB (ref 22–32)
Calcium: 7.5 mg/dL — ABNORMAL LOW (ref 8.9–10.3)
Creatinine, Ser: 1.04 mg/dL — ABNORMAL HIGH (ref 0.44–1.00)
GFR calc Af Amer: 52 mL/min — ABNORMAL LOW (ref >60)
GFR calc non Af Amer: 45 mL/min — ABNORMAL LOW (ref >60)
GLUCOSE: 111 mg/dL — AB (ref 65–99)
POTASSIUM: 3.2 mmol/L — AB (ref 3.5–5.1)
Sodium: 144 mmol/L (ref 135–145)

## 2015-05-16 LAB — VITAMIN B12: VITAMIN B 12: 2124 pg/mL — AB (ref 180–914)

## 2015-05-16 LAB — FOLATE: FOLATE: 6.7 ng/mL (ref >5.9)

## 2015-05-16 LAB — BRAIN NATRIURETIC PEPTIDE: B NATRIURETIC PEPTIDE 5: 3210.9 pg/mL — AB (ref 0.0–100.0)

## 2015-05-16 LAB — PREALBUMIN: Prealbumin: 11.8 mg/dL — ABNORMAL LOW (ref 18–38)

## 2015-05-16 LAB — RPR: RPR Ser Ql: NONREACTIVE

## 2015-05-16 MED ORDER — FUROSEMIDE 10 MG/ML IJ SOLN
40.0000 mg | Freq: Every day | INTRAMUSCULAR | Status: DC
  Filled 2015-05-16: qty 4

## 2015-05-16 MED ORDER — FUROSEMIDE 40 MG PO TABS
40.0000 mg | ORAL_TABLET | Freq: Every day | ORAL | Status: DC
  Filled 2015-05-16 (×2): qty 1

## 2015-05-16 MED ORDER — SODIUM CHLORIDE 0.9 % IJ SOLN
10.0000 mL | INTRAMUSCULAR | Status: DC | PRN
  Administered 2015-05-19 – 2015-05-22 (×3): 10 mL
  Filled 2015-05-16 (×3): qty 40

## 2015-05-16 MED ORDER — FUROSEMIDE 10 MG/ML IJ SOLN
40.0000 mg | Freq: Once | INTRAMUSCULAR | Status: AC
  Administered 2015-05-16: 40 mg via INTRAVENOUS
  Filled 2015-05-16: qty 4

## 2015-05-16 MED ORDER — IOHEXOL 300 MG/ML  SOLN
150.0000 mL | Freq: Once | INTRAMUSCULAR | Status: DC | PRN
  Administered 2015-05-16: 75 mL via ORAL
  Administered 2015-05-18: 150 mL via ORAL
  Filled 2015-05-16 (×2): qty 150

## 2015-05-16 NOTE — Progress Notes (Signed)
5 Days Post-Op  Subjective: She does not complain, says she will get up later.  Sitting up but gurgling and some wheezing.they tell me her sats are 100%.  She doesn't seem distressed by it.  Objective: Vital signs in last 24 hours: Temp:  [98.2 F (36.8 C)-99 F (37.2 C)] 98.5 F (36.9 C) (08/23 0732) Pulse Rate:  [93-109] 109 (08/23 0732) Resp:  [18-20] 20 (08/23 0732) BP: (120-133)/(61-67) 133/63 mmHg (08/23 0732) SpO2:  [94 %-100 %] 100 % (08/23 0732) Last BM Date: 05/11/15 3000 iv IN, 900 OUT 2 LITERS positive yesterday 14000 since admit Afebrile, still a little tachycardic WBC is better H/H is stable other labs pending Intake/Output from previous day: 08/22 0701 - 08/23 0700 In: 3000 [I.V.:1800; IV Piggyback:1200] Out: 900 [Urine:900] Intake/Output this shift:    General appearance: alert, cooperative, no distress and she sounds wet from pulmonary standpoint just walking into the room. Resp: rales both bases, rhonchi both base, few wheezes GI: soft, few BS, Wound looks fine.  Lab Results:   Recent Labs  05/14/15 1028 05/16/15 0705  WBC 20.2* 11.7*  HGB 8.6* 7.9*  HCT 26.9* 23.6*  PLT 262 266    BMET  Recent Labs  05/14/15 1028  NA 142  K 3.7  CL 123*  CO2 15*  GLUCOSE 90  BUN 17  CREATININE 1.18*  CALCIUM 7.6*   PT/INR No results for input(s): LABPROT, INR in the last 72 hours.   Recent Labs Lab 05/11/15 0305  AST 36  ALT 10*  ALKPHOS 51  BILITOT 2.3*  PROT 7.3  ALBUMIN 3.4*     Lipase     Component Value Date/Time   LIPASE 441* 05/11/2015 0305     Studies/Results: No results found.  Medications: . albuterol  2.5 mg Nebulization Q12H  . antiseptic oral rinse  7 mL Mouth Rinse q12n4p  . chlorhexidine  15 mL Mouth Rinse BID  . ciprofloxacin  400 mg Intravenous Q24H  . furosemide  40 mg Intravenous Once  . heparin  5,000 Units Subcutaneous 3 times per day  . metronidazole  500 mg Intravenous Q8H  . pantoprazole (PROTONIX) IV   80 mg Intravenous Q12H   IV fluids off for now.  Assessment/Plan Perforated Prepyloric gastric ulcer S/p LAPAROSCOPY DIAGNOSTIC converted open repair gastric ulcer and omental patch, 1/61/09, Dr. Leighton Ruff; POD 5 Chronic knee pain/osteoarthritis previously on NSAID - Wheelchair bound at home Acute kidney injury - resolving New onset pulmonary edema 05/15/15, - BNP 3210 Mild dementia Hypertension Ongoing weight loss/PCM 04/17/15 per PCP Hypertension Hx of B12 deficiency Hypokalemia Hx of GERD Hx of breast cancer, with left mastectomy Antibiotics: Day 7 Cipro/Flagyl DVT: Heparin/SCD     Plan:  Dr.Rai has already seen her and ordered lasix, held IV fluids, and CXR.  Her WBC is down so I think we can go forward with the UGI to look for a leak. SHE IS also to get a thoracentesis today.  I had them recheck her Sats in bed on RA, currently 97-100%.    Appreciate Dr. Josem Kaufmann help.   BMP OK, K+ is low 3.2 BNP 3210  CXR: New onset of pulmonary edema. Bibasilar atelectasis and right pleural effusion are stable.  LOS: 5 days    Jolynn Bajorek 05/16/2015

## 2015-05-16 NOTE — Progress Notes (Signed)
Patient ID: Elizabeth Peterson, female   DOB: 1919/08/04, 81 y.o.   MRN: [id number] Aware of order for thoracentesis on pt. Latest CXR shows new onset pulmonary edema, stable rt effusion. Pt has no IV access now- awaiting IV team evaluation. Due to receive oral lasix now. O2 sats 100 % on 2 liters N/C. Case d/w CCS and they recommend holding on thoracentesis until tomorrow am. Nurse aware.

## 2015-05-16 NOTE — Plan of Care (Signed)
Problem: Consults Goal: General Medical Patient Education See Patient Education Module for specific education.  Outcome: Not Progressing Pt. has short and long-term memory loss and is unable to remember many things for repetition.  Problem: Phase I Progression Outcomes Goal: Voiding-avoid urinary catheter unless indicated Outcome: Not Progressing Pt. still has Foley catheter due to complicating issues of fluid retention requiring additional meds.

## 2015-05-16 NOTE — Plan of Care (Signed)
Problem: Phase II Progression Outcomes Goal: IV changed to normal saline lock Outcome: Progressing Peripheral IV changed to PICC.  Comments:  Pt. has no further usable peripheral veins in Rt. arm for IVs: Lt. Arm is usable due to previous surgical issues. PICC inserted into Rt.upper arm by IV therapist.

## 2015-05-16 NOTE — Progress Notes (Signed)
[facility] Consult progress note                                                                               Patient Demographics  Elizabeth Peterson, is a 81 y.o. female, DOB - 1918-11-04, VQQ:[id number]  Admit date - 05/11/2015   Admitting Physician Leighton Ruff, MD  Outpatient Primary MD for the patient is REED, McMullen, DO  LOS - 5   Chief Complaint  Patient presents with  . Abdominal Pain       Brief HPI   Patient is a 81 year old female with hypertension, hyperlipidemia, dementia (confirmed with granddaughter at the bedside), GERD, anemi, wheelchair-bound due to osteoarthritis a had originally presented on 8/18 to the ED with moderate generalized abdominal pain for several weeks which worsened her day before the admission. She also had associated nausea and vomiting on the day of admission. Patient had significant tenderness with rebound and guarding and was found to have perforated gastric ulcer. Patient underwent laparoscopic repair of the gastric ulcer and an omental patch. Postoperative care was managed by critical care service, postop period was complicated by ongoing hypotension, acute kidney injury, worsening mental status superimposed on dementia. She was also noticed to have small pleural effusion with wheezing.  Palliative medicine was also consulted for goals of care. Internal medicine consulted today for medical comanagement.    Assessment & Plan    Active Problems: Dyspnea with pulmonary edema - Patient seen and examined earlier this a.m., with rhonchorous lungs, although sats 97-100%. Stat chest x-ray showed pulmonary edema. I's and O's with 14 L positive net balance, stat BNP 3210.  - Discontinued IV fluids, gave 1 dose of Lasix 40 mg IV 1, placed on daily Lasix until euvolemic and balanced I's and O's - As her leukocytosis has improved, holding off on antibiotics  Gastric ulcer with perforation and open repair - Management per the  primary team  - continue IV PPI  Altered mental status with underlying Alzheimer's dementia, oriented 2 - overall improving however possibly has sundowning worsened with underlying dementia - Per the granddaughter at the bedside on 8/22, her mental status has been steadily improving, holding off on CT head for now - Noted that palliative medicine has been consulted and goals of care to be addressed. - UA negative for any UTI, B12, folate, TSH all normal  Leukocytosis - WBC count down, no fevers, surgery planning for upper GI studies today, if negative will be placing on diet.  -Blood cultures negative so far    Pleural effusion, moderate right-sided, Asthmatic bronchitis - Continue scheduled nebs, IR for right-sided thoracentesis today  Hypertension - Currently BP stable, not on any antihypertensives  Anemia Likely due to #1, transfuse for hemoglobin less than 7.5, currently stable   Disposition Plan: Per surgery  Time Spent in minutes 35 minutes  Procedures  Chest x-ray stat  DVT Prophylaxis  heparin subcutaneous  Medications  Scheduled Meds: . albuterol  2.5 mg Nebulization Q12H  . antiseptic oral rinse  7 mL Mouth Rinse q12n4p  . chlorhexidine  15 mL Mouth Rinse BID  . ciprofloxacin  400 mg Intravenous Q24H  .  furosemide  40 mg Intravenous Once  . heparin  5,000 Units Subcutaneous 3 times per day  . metronidazole  500 mg Intravenous Q8H  . pantoprazole (PROTONIX) IV  80 mg Intravenous Q12H   Continuous Infusions:  PRN Meds:.albuterol, fentaNYL (SUBLIMAZE) injection, ondansetron **OR** ondansetron (ZOFRAN) IV   Antibiotics   Anti-infectives    Start     Dose/Rate Route Frequency Ordered Stop   05/13/15 0600  ciprofloxacin (CIPRO) IVPB 400 mg     400 mg 200 mL/hr over 60 Minutes Intravenous Every 24 hours 05/12/15 0757 05/20/15 0559   05/12/15 0800  metroNIDAZOLE (FLAGYL) IVPB 500 mg     500 mg 100 mL/hr over 60 Minutes Intravenous Every 8 hours 05/12/15  0725 05/19/15 0759   05/12/15 0730  ciprofloxacin (CIPRO) IVPB 400 mg  Status:  Discontinued     400 mg 200 mL/hr over 60 Minutes Intravenous Every 12 hours 05/12/15 0725 05/12/15 0756   05/12/15 0600  ciprofloxacin (CIPRO) IVPB 400 mg     400 mg 200 mL/hr over 60 Minutes Intravenous Every 24 hours 05/11/15 1118 05/12/15 0648   05/11/15 1100  metroNIDAZOLE (FLAGYL) IVPB 500 mg     500 mg 100 mL/hr over 60 Minutes Intravenous Every 8 hours 05/11/15 1036 05/11/15 1227   05/11/15 1045  ciprofloxacin (CIPRO) IVPB 400 mg  Status:  Discontinued     400 mg 200 mL/hr over 60 Minutes Intravenous Every 12 hours 05/11/15 1036 05/11/15 1117   05/11/15 0515  ciprofloxacin (CIPRO) IVPB 400 mg     400 mg 200 mL/hr over 60 Minutes Intravenous  Once 05/11/15 0503 05/11/15 0656   05/11/15 0515  metroNIDAZOLE (FLAGYL) IVPB 500 mg     500 mg 100 mL/hr over 60 Minutes Intravenous  Once 05/11/15 0503 05/11/15 2130        Subjective:   Elizabeth Peterson was seen and examined  earlier today, noticed to be tight, rhonchorous breath sounds, no significant respiratory distress.  Somewhat confused, sitting at the bedside. Patient denies dizziness, chest pain, N/V, new weakness, numbess, tingling. No acute events overnight.    Objective:   Blood pressure 128/67, pulse 93, temperature 98.2 F (36.8 C), temperature source Oral, resp. rate 18, height 5\' 3"  (1.6 m), weight 53.3 kg (117 lb 8.1 oz), SpO2 94 %.  Wt Readings from Last 3 Encounters:  05/12/15 53.3 kg (117 lb 8.1 oz)  12/06/13 50.44 kg (111 lb 3.2 oz)  09/06/13 48.988 kg (108 lb)     Intake/Output Summary (Last 24 hours) at 05/16/15 0723 Last data filed at 05/16/15 0600  Gross per 24 hour  Intake   3000 ml  Output    900 ml  Net   2100 ml    Exam  General: Alert and oriented x2 NAD  HEENT:  PERRLA, EOMI, Anicteric Sclera, mucous membranes moist.   Neck: Supple, no JVD, no masses  CVS: S1 S2 auscultated, no rubs, murmurs or gallops.  Regular rate and rhythm.  Respiratory: Coarse breath sounds throughout  Abdomen: Soft, dressing intact  Ext: no cyanosis clubbing or edema  Neuro: no new deficits  Skin: No rashes  Psych: Normal affect and demeanor, alert and oriented x2   Data Review   Micro Results Recent Results (from the past 240 hour(s))  MRSA PCR Screening     Status: None   Collection Time: 05/11/15  9:55 AM  Result Value Ref Range Status   MRSA by PCR NEGATIVE NEGATIVE Final    Comment:  The GeneXpert MRSA Assay (FDA approved for NASAL specimens only), is one component of a comprehensive MRSA colonization surveillance program. It is not intended to diagnose MRSA infection nor to guide or monitor treatment for MRSA infections.     Radiology Reports Ct Abdomen Pelvis Wo Contrast  05/11/2015   CLINICAL DATA:  Generalized abdominal pain, worse last night.  EXAM: CT ABDOMEN AND PELVIS WITHOUT CONTRAST  TECHNIQUE: Multidetector CT imaging of the abdomen and pelvis was performed following the standard protocol without IV contrast.  COMPARISON:  None.  FINDINGS: BODY WALL: No contributory findings.  LOWER CHEST: Small bilateral pleural effusion with right basilar atelectasis. Small lower pneumomediastinum through hiatal hernia.  ABDOMEN/PELVIS:  Liver: Few sub cm low densities favor cysts.  Biliary: Haziness of fat around the gallbladder is likely secondary. No primary inflammation suspected.  Pancreas: Unremarkable.  Spleen: Unremarkable.  Adrenals: Unremarkable.  Kidneys and ureters: Mild bilateral renal cortical atrophy. No hydronephrosis.  Bladder: Distended without wall thickening.  Reproductive: Hysterectomy.  No adnexal mass.  Bowel: There is a large volume of pneumoperitoneum distributed throughout the abdomen. Given the large volume, distribution is not helpful in identifying a source. Small volume ascites accompanies the fluid, with probable peritoneal thickening in the pelvic recesses. The  source of perforation is uncertain; there is no definitive pneumatosis or ulceration. No inflammatory wall thickening around the gastric antrum or duodenum. The cecum is gas filled and ventrally rotated, but there is no bascule or volvulus and no associated pneumatosis. Oral contrast reaches the third portion duodenum and there is no contrast extravasation.  Vascular: No acute abnormality.  OSSEOUS: No acute abnormalities.  These results were called by telephone at the time of interpretation on 05/11/2015 at 5:02 am to Dr. Julianne Rice , who verbally acknowledged these results.  IMPRESSION: 1. Perforated bowel with large pneumoperitoneum and small ascites. The site of perforation is uncertain; no extravasation of oral contrast which reaches the third portion duodenum. Colonic diverticulosis is present. 2. Atelectasis and small bilateral pleural effusion.   Electronically Signed   By: Monte Fantasia M.D.   On: 05/11/2015 05:07   Dg Chest Port 1 View  05/14/2015   CLINICAL DATA:  Shortness of breath  EXAM: PORTABLE CHEST - 1 VIEW  COMPARISON:  05/12/2015  FINDINGS: Nasogastric tube tip has been removed. Dilated colon again seen in the left abdomen.  Bilateral basilar opacification, greater on the right. Appearance suggests associated pleural fluid. Unchanged cardiopericardial enlargement. Stable upper mediastinal contours.  IMPRESSION: Stable bibasilar lung opacity, greater on the right. Admission abdominal CT favors pleural fluid and atelectasis.   Electronically Signed   By: Monte Fantasia M.D.   On: 05/14/2015 06:14   Dg Chest Port 1 View  05/12/2015   CLINICAL DATA:  Respiratory failure.  EXAM: PORTABLE CHEST - 1 VIEW  COMPARISON:  05/11/2015.  FINDINGS: NG tube noted coiled in stomach. Mediastinum hilar structures are normal. Heart size stable. Right lower lobe infiltrate consistent pneumonia. Right pleural effusion. Subsegmental atelectasis left lung base. No pneumothorax. Surgical clip left upper  abdomen.  IMPRESSION: 1.  NG tube noted coiled in stomach.  2. Right lower lobe infiltrate consistent pneumonia. Moderate right pleural effusion.   Electronically Signed   By: Marcello Moores  Register   On: 05/12/2015 07:10   Dg Chest Port 1 View  05/11/2015   CLINICAL DATA:  Hypoxia  EXAM: PORTABLE CHEST - 1 VIEW  COMPARISON:  02/28/2013  FINDINGS: Normal heart size and aortic contours.  Atelectasis in the  right upper lobe seen on the previous study has resolved. There is an indistinct opacity at the right base obscuring the diaphragm which is elevated compared to prior. No edema, effusion, or air leak.  IMPRESSION: Hazy opacity at the right base favoring atelectasis. This area should be seen on abdominal CT scheduled for later today.   Electronically Signed   By: Monte Fantasia M.D.   On: 05/11/2015 03:41    CBC  Recent Labs Lab 05/11/15 0305 05/12/15 0414 05/13/15 0350 05/14/15 1028 05/16/15 0705  WBC 5.2 13.7* 18.8* 20.2* 11.7*  HGB 12.7 10.2* 7.8* 8.6* 7.9*  HCT 39.3 32.2* 24.1* 26.9* 23.6*  PLT 412* PLATELET CLUMPS NOTED ON SMEAR, COUNT APPEARS ADEQUATE 267 262 266  MCV 93.3 92.8 92.3 93.7 91.8  MCH 30.2 29.4 29.9 30.0 30.7  MCHC 32.3 31.7 32.4 32.0 33.5  RDW 13.6 14.0 14.4 15.0 15.2    Chemistries   Recent Labs Lab 05/11/15 0305 05/12/15 0414 05/13/15 0350 05/14/15 1028  NA 139 141 141 142  K 3.1* 4.0 3.2* 3.7  CL 107 114* 121* 123*  CO2 16* 14* 15* 15*  GLUCOSE 157* 24* 91 90  BUN 20 27* 27* 17  CREATININE 1.72* 1.57* 1.37* 1.18*  CALCIUM 9.0 7.4* 7.1* 7.6*  MG  --   --  1.4*  --   AST 36  --   --   --   ALT 10*  --   --   --   ALKPHOS 51  --   --   --   BILITOT 2.3*  --   --   --    ------------------------------------------------------------------------------------------------------------------ estimated creatinine clearance is 24.1 mL/min (by C-G formula based on Cr of  1.18). ------------------------------------------------------------------------------------------------------------------ No results for input(s): HGBA1C in the last 72 hours. ------------------------------------------------------------------------------------------------------------------ No results for input(s): CHOL, HDL, LDLCALC, TRIG, CHOLHDL, LDLDIRECT in the last 72 hours. ------------------------------------------------------------------------------------------------------------------  Recent Labs  05/15/15 2215  TSH 3.488   ------------------------------------------------------------------------------------------------------------------  Recent Labs  05/15/15 2215  VITAMINB12 2124*  FOLATE 6.7    Coagulation profile No results for input(s): INR, PROTIME in the last 168 hours.  No results for input(s): DDIMER in the last 72 hours.  Cardiac Enzymes No results for input(s): CKMB, TROPONINI, MYOGLOBIN in the last 168 hours.  Invalid input(s): CK ------------------------------------------------------------------------------------------------------------------ Invalid input(s): [place]  05/15/15 0743 05/15/15 1205 05/15/15 1626 05/15/15 2016 05/16/15 0006 05/16/15 0444  GLUCAP 93 88 96 106* 106* 74     Nieves Chapa M.D. [facility] 05/16/2015, 7:23 AM  Pager: [phone] Between 7am to 7pm - call Pager - [phone]  After 7pm go to [email or web address] - password [email or web address]  Call night coverage person covering after 7pm

## 2015-05-16 NOTE — Progress Notes (Signed)
UGI results given to [employer] PA.  May advance diet to clear liquids patient may have Lasix po if medical agrees.  Discussed this am with medical and instructed to give PO.  Order placed for po Lasix

## 2015-05-16 NOTE — Progress Notes (Addendum)
Very poor venous access. Limited to right arm only. Assessed with ultrasound, unable to obtain PIV access. Recommend PICC if needed, floor RN at bedside aware, to notify MD.

## 2015-05-16 NOTE — Progress Notes (Signed)
[facility] PCCM  Chart reviewed, for thoracentesis today  Will see tomorrow after thoracentesis  Roselie Awkward, MD [place] [facility] Pager: [phone] Cell: [phone] After 3pm or if no response, call [phone]

## 2015-05-16 NOTE — Care Management Important Message (Signed)
Important Message  Patient Details  Name: Elizabeth Peterson MRN: [id number] Date of Birth: 09/19/1919   Medicare Important Message Given:  Yes-second notification given    Camillo Flaming 05/16/2015, 12:32 [place] Message  Patient Details  Name: Elizabeth Peterson MRN: [id number] Date of Birth: 04/08/1919   Medicare Important Message Given:  Yes-second notification given    Camillo Flaming 05/16/2015, 12:32 PM

## 2015-05-16 NOTE — Progress Notes (Signed)
OT Cancellation Note  Patient Details Name: Elizabeth Peterson MRN: [id number] DOB: 1919-04-18   Cancelled Treatment:    Reason Eval/Treat Not Completed: Patient at procedure or test/ unavailable  Darlina Rumpf [place], OTR/L [phone]  05/16/2015, 2:23 PM

## 2015-05-16 NOTE — Progress Notes (Signed)
[place] PA and Dr Tana Coast made aware patient had no IV access and IV team unable to start line for all meds including Lasix.  Orders received.  Will continue to monitor

## 2015-05-16 NOTE — Progress Notes (Signed)
Pt. is awake, alert and lung sounds have rhonchi/rales bilaterally.  PICC line is currently being placed at the bedside, after telephone consent was obtained from [place] (daughter) who is in [place], [place].  IV meds have thus far been withheld pending placement of the line, today, when previous peripheral IV was found to be leaking and non-functional.  Pt. returned earlier from UGI and seemed to have tolerated it fairly well, though she complain briefly, of chest pain, which resolved without intervention, within about 1-2 minutes, per Pt. report.  Pt. was given oral care and tolerated fairly well, though she c/o bad taste of the CHG mouthwash delivered via sponge toothbrush.  Pt. remains lying in bed with HOB up 30+degrees.

## 2015-05-16 NOTE — Progress Notes (Signed)
Peripherally Inserted Central Catheter/Midline Placement  The IV Nurse has discussed with the patient and/or persons authorized to consent for the patient, the purpose of this procedure and the potential benefits and risks involved with this procedure.  The benefits include less needle sticks, lab draws from the catheter and patient may be discharged home with the catheter.  Risks include, but not limited to, infection, bleeding, blood clot (thrombus formation), and puncture of an artery; nerve damage and irregular heat beat.  Alternatives to this procedure were also discussed.  PICC/Midline Placement Documentation    Telephone consent obtained with daughter.    Elizabeth Peterson 05/16/2015, 4:55 PM

## 2015-05-16 NOTE — Progress Notes (Signed)
At 17:02 Pt. Was found to have a CBG=66; Sweet drinks offered and Pt. Drank them to elevate her CBG to 76 at 17:47.  Pt. continues to have a poor appetite for offered clear liquid diet, but did eat 3/4 red jello and about 1/5 of the lemon italian ice.  IV Lasix has resulted in output of 300cc by 18:00 and an additional 300 by 18:45.  Urine is pale yellow. Pt. remains alert and oriented X1- 2 with frequent and intermittent memory loss-short -term and long-term.  Pt. denies any pain, despite frequent moans and refuses any medication for any pain she may not admit to.  PICC line in upper Lt. arm wrapped in Kerlix gauze to prevent Pt. from picking at it and dislodging the tubing.  Abdominal dressing changed earlier with wet 4x4 gauze applied into the open trough wound in her lower mid-abdomen. This was covered with dry 4x4s and all covered with an ABD and taped with paper tape entirely around the dressing, since Pt. was seen putting her hands on the old dressing and abdominal area.  Pt.taught not to dislodge her dressings with limited acknowledgment by Pt., who only made a "humph" type of sound.

## 2015-05-17 DIAGNOSIS — R0600 Dyspnea, unspecified: Secondary | ICD-10-CM

## 2015-05-17 DIAGNOSIS — R062 Wheezing: Secondary | ICD-10-CM | POA: Insufficient documentation

## 2015-05-17 LAB — BASIC METABOLIC PANEL
ANION GAP: 8 (ref 5–15)
BUN: 13 mg/dL (ref 6–20)
CALCIUM: 7.3 mg/dL — AB (ref 8.9–10.3)
CO2: 18 mmol/L — ABNORMAL LOW (ref 22–32)
Chloride: 119 mmol/L — ABNORMAL HIGH (ref 101–111)
Creatinine, Ser: 1.02 mg/dL — ABNORMAL HIGH (ref 0.44–1.00)
GFR, EST AFRICAN AMERICAN: 53 mL/min — AB (ref >60)
GFR, EST NON AFRICAN AMERICAN: 46 mL/min — AB (ref >60)
Glucose, Bld: 83 mg/dL (ref 65–99)
Potassium: 3.1 mmol/L — ABNORMAL LOW (ref 3.5–5.1)
SODIUM: 145 mmol/L (ref 135–145)

## 2015-05-17 LAB — GLUCOSE, CAPILLARY
GLUCOSE-CAPILLARY: 71 mg/dL (ref 65–99)
GLUCOSE-CAPILLARY: 77 mg/dL (ref 65–99)
Glucose-Capillary: 85 mg/dL (ref 65–99)
Glucose-Capillary: 84 mg/dL (ref 65–99)
Glucose-Capillary: 113 mg/dL — ABNORMAL HIGH (ref 65–99)

## 2015-05-17 LAB — CBC
HCT: 21.2 % — ABNORMAL LOW (ref 36.0–46.0)
HEMOGLOBIN: 7 g/dL — AB (ref 12.0–15.0)
MCH: 30.3 pg (ref 26.0–34.0)
MCHC: 33 g/dL (ref 30.0–36.0)
MCV: 91.8 fL (ref 78.0–100.0)
PLATELETS: 268 10*3/uL (ref 150–400)
RBC: 2.31 MIL/uL — AB (ref 3.87–5.11)
RDW: 15.3 % (ref 11.5–15.5)
WBC: 12.8 10*3/uL — AB (ref 4.0–10.5)

## 2015-05-17 LAB — MAGNESIUM: Magnesium: 1.3 mg/dL — ABNORMAL LOW (ref 1.7–2.4)

## 2015-05-17 LAB — PREPARE RBC (CROSSMATCH)

## 2015-05-17 MED ORDER — FUROSEMIDE 10 MG/ML IJ SOLN
40.0000 mg | Freq: Every day | INTRAMUSCULAR | Status: DC
  Administered 2015-05-17 – 2015-05-21 (×5): 40 mg via INTRAVENOUS
  Filled 2015-05-17 (×5): qty 4

## 2015-05-17 MED ORDER — POTASSIUM CHLORIDE CRYS ER 20 MEQ PO TBCR
20.0000 meq | EXTENDED_RELEASE_TABLET | Freq: Two times a day (BID) | ORAL | Status: DC
  Administered 2015-05-17 – 2015-05-21 (×9): 20 meq via ORAL
  Filled 2015-05-17 (×14): qty 1

## 2015-05-17 MED ORDER — OXYCODONE HCL 5 MG/5ML PO SOLN
5.0000 mg | ORAL | Status: DC | PRN
  Administered 2015-05-17 – 2015-05-18 (×2): 5 mg via ORAL
  Filled 2015-05-17 (×2): qty 5

## 2015-05-17 MED ORDER — SODIUM CHLORIDE 0.9 % IV SOLN
Freq: Once | INTRAVENOUS | Status: AC
  Administered 2015-05-17: 12:00:00 via INTRAVENOUS

## 2015-05-17 MED ORDER — ACETAMINOPHEN 160 MG/5ML PO SOLN
650.0000 mg | ORAL | Status: DC | PRN
Start: 2015-05-17 — End: 2015-05-18

## 2015-05-17 MED ORDER — MAGNESIUM SULFATE 2 GM/50ML IV SOLN
2.0000 g | Freq: Once | INTRAVENOUS | Status: AC
  Administered 2015-05-17: 2 g via INTRAVENOUS
  Filled 2015-05-17: qty 50

## 2015-05-17 MED ORDER — FUROSEMIDE 10 MG/ML IJ SOLN
20.0000 mg | Freq: Once | INTRAMUSCULAR | Status: AC
  Administered 2015-05-17: 20 mg via INTRAVENOUS
  Filled 2015-05-17: qty 2

## 2015-05-17 NOTE — Progress Notes (Signed)
PULMONARY / CRITICAL CARE MEDICINE   Name: Elizabeth Peterson MRN: [id number] DOB: [birth year]    ADMISSION DATE:  05/11/2015 CONSULTATION DATE: 8/18  REFERRING MD : CCS  CHIEF COMPLAINT:  Perfed gastric ulcer  INITIAL PRESENTATION: 81 y/o female with dementia.  Abd pain> perforated gastric ulcer.  She had an open repair on 8/18.    STUDIES:    SIGNIFICANT EVENTS: 8/18 p lap   HISTORY OF PRESENT ILLNESS:   Post exp lap   SUBJECTIVE: breathing improved, has been diuresing  VITAL SIGNS: Temp:  [97.5 F (36.4 C)-99.3 F (37.4 C)] 99.3 F (37.4 C) (08/24 1201) Pulse Rate:  [79-110] 93 (08/24 1201) Resp:  [17-20] 20 (08/24 1201) BP: (92-129)/(60-71) 92/71 mmHg (08/24 1201) SpO2:  [99 %-100 %] 100 % (08/24 1201) HEMODYNAMICS:     INTAKE / OUTPUT:  Intake/Output Summary (Last 24 hours) at 05/17/15 1204 Last data filed at 05/17/15 1030  Gross per 24 hour  Intake    490 ml  Output   2950 ml  Net  -2460 ml    PHYSICAL EXAMINATION: General:  Frail elderly female in no distress HENT: NCAT OP clear PULM: no wheezing, breathing comfortable CV: RRR, S1/S2 GI: BS+, soft, nontneder MSK: normal bulk and tone Neuro: Awake, confused but conversant  LABS:  CBC  Recent Labs Lab 05/14/15 1028 05/16/15 0705 05/17/15 0513  WBC 20.2* 11.7* 12.8*  HGB 8.6* 7.9* 7.0*  HCT 26.9* 23.6* 21.2*  PLT 262 266 268   Coag's No results for input(s): APTT, INR in the last 168 hours. BMET  Recent Labs Lab 05/14/15 1028 05/16/15 0705 05/17/15 0513  NA 142 144 145  K 3.7 3.2* 3.1*  CL 123* 120* 119*  CO2 15* 15* 18*  BUN 17 13 13   CREATININE 1.18* 1.04* 1.02*  GLUCOSE 90 111* 83   Electrolytes  Recent Labs Lab 05/13/15 0350 05/14/15 1028 05/16/15 0705 05/17/15 0513  CALCIUM 7.1* 7.6* 7.5* 7.3*  MG 1.4*  --   --  1.3*  PHOS 2.5  --   --   --    Sepsis Markers  Recent Labs Lab 05/11/15 0323 05/12/15 0414  LATICACIDVEN 8.31* 3.7*   ABG No results for  input(s): PHART, PCO2ART, PO2ART in the last 168 hours. Liver Enzymes  Recent Labs Lab 05/11/15 0305  AST 36  ALT 10*  ALKPHOS 51  BILITOT 2.3*  ALBUMIN 3.4*   Cardiac Enzymes No results for input(s): TROPONINI, PROBNP in the last 168 hours. Glucose  Recent Labs Lab 05/16/15 1702 05/16/15 1747 05/16/15 2132 05/16/15 2334 05/17/15 0355 05/17/15 0752  GLUCAP 66 76 91 92 85 84    Imaging CXR from 8/21 reviwed> modearate R effusion   ASSESSMENT / PLAN:  PULMONARY Moderate R sided pleural effusion > now nearly completely resolved post diuresis, see image from chart today, minimal fluid today, not amenable to therapeutic thoracentesis Asthmatic bronchitis > improved with albuterol; agree ddx fluid overload but has history of asthma and now better with albuterol Likely acute pulmonary edema now improved post diuresis A: P:   O2 as needed to maintain O2 sat > 92% Plan on albuterol bid and q4h prn for about a week, then prn, will need albuterol and a nebulizer machine at home Goal net even, watch fluid status carefully after giving blood    FAMILY  - Updates: Updated Granddaughter at bedside, daughter by phone - Inter-disciplinary family meet or Palliative Care meeting due by:  day 7   PCCM to sign  off  Roselie Awkward, MD [place] [facility]M Pager: [phone] Cell: [phone] After 3pm or if no response, call [phone]

## 2015-05-17 NOTE — Evaluation (Signed)
SLP Cancellation Note  Patient Details Name: Elizabeth Peterson MRN: [id number] DOB: 09/14/1919   Cancelled treatment:        EPIC down in pm on 8/24.  Pt medical information not available in EPIC at time of SLP availability.  Will follow up next am, SLP apologizes for delay. Luanna Salk, [facility] SLP [phone]

## 2015-05-17 NOTE — Progress Notes (Signed)
6 Days Post-Op  Subjective: She is still confused, but less so than the last 2 days.  She says her abdomen hurts some.  Follows instructions and does whatever you ask without complaint.  Objective: Vital signs in last 24 hours: Temp:  [97.5 F (36.4 C)-98.5 F (36.9 C)] 97.5 F (36.4 C) (08/24 0532) Pulse Rate:  [79-110] 100 (08/24 0817) Resp:  [17-18] 17 (08/24 0817) BP: (112-129)/(60-71) 129/64 mmHg (08/24 0532) SpO2:  [98 %-100 %] 100 % (08/24 0817) Last BM Date: 05/11/15 390 PO  Diet: clears 2700 urine,  Afebrile, VSS still tachycardic most of the time K+ 3.1 creatinine is stable On daily lasix now Hbg down to 7.0  Intake/Output from previous day: 08/23 0701 - 08/24 0700 In: 52 [P.O.:390; IV Piggyback:100] Out: 2700 [Urine:2700] Intake/Output this shift:    General appearance: alert, cooperative, no distress and less confused, answers questions well today.  Mittens still in place and she will pick at things if you let her. Resp: Mostly clear, few rales, no wheezing this AM  still down some in the bases. GI: soft, sore, few BS, wound is open and packed.  It looks good.  Lab Results:   Recent Labs  05/16/15 0705 05/17/15 0513  WBC 11.7* 12.8*  HGB 7.9* 7.0*  HCT 23.6* 21.2*  PLT 266 268    BMET  Recent Labs  05/16/15 0705 05/17/15 0513  NA 144 145  K 3.2* 3.1*  CL 120* 119*  CO2 15* 18*  GLUCOSE 111* 83  BUN 13 13  CREATININE 1.04* 1.02*  CALCIUM 7.5* 7.3*   PT/INR No results for input(s): LABPROT, INR in the last 72 hours.   Recent Labs Lab 05/11/15 0305  AST 36  ALT 10*  ALKPHOS 51  BILITOT 2.3*  PROT 7.3  ALBUMIN 3.4*     Lipase     Component Value Date/Time   LIPASE 441* 05/11/2015 0305     Studies/Results: Dg Chest Port 1 View  05/16/2015   CLINICAL DATA:  Increasing short of breath  EXAM: PORTABLE CHEST - 1 VIEW  COMPARISON:  05/14/2015  FINDINGS: The heart is moderately enlarged. Pulmonary edema has developed. Opacity at the  right base is stable likely a combination of volume loss, consolidation, and pleural fluid. Atelectasis at the left base is stable. Tiny left pleural effusion is not excluded.  IMPRESSION: New onset of pulmonary edema. Bibasilar atelectasis and right pleural effusion are stable.   Electronically Signed   By: Marybelle Killings M.D.   On: 05/16/2015 08:19   Dg Duanne Limerick W/water Sol Cm  05/16/2015   CLINICAL DATA:  Patient status post repair per stroke perforated gastric ulcer with omental patch.  EXAM: WATER SOLUBLE UPPER GI SERIES  TECHNIQUE: Single-column upper GI series was performed using water soluble contrast.  CONTRAST:  38mL OMNIPAQUE IOHEXOL 300 MG/ML  SOLN  COMPARISON:  None.  FLUOROSCOPY TIME:  Radiation Exposure Index (as provided by the fluoroscopic device):  If the device does not provide the exposure index:  Fluoroscopy Time (in minutes and seconds):  1 minutes 52 seconds  Number of Acquired Images:  8  FINDINGS: Exam is suboptimal due to limited intake of the oral contrast as well as limited patient mobility. The contrast does flow easily into the stomach and ultimately passes into the first and second second portion of the duodenum. Stomach is gas-filled. No evidence of leak of the oral contrast.  IMPRESSION: No evidence of leak of oral contrast from the stomach on  limited exam.   Electronically Signed   By: Suzy Bouchard M.D.   On: 05/16/2015 15:04    Medications: . albuterol  2.5 mg Nebulization Q12H  . antiseptic oral rinse  7 mL Mouth Rinse q12n4p  . chlorhexidine  15 mL Mouth Rinse BID  . ciprofloxacin  400 mg Intravenous Q24H  . furosemide  40 mg Oral Daily  . heparin  5,000 Units Subcutaneous 3 times per day  . metronidazole  500 mg Intravenous Q8H  . pantoprazole (PROTONIX) IV  80 mg Intravenous Q12H    Assessment/Plan Perforated Prepyloric gastric ulcer S/p LAPAROSCOPY DIAGNOSTIC converted open repair gastric ulcer and omental patch, 5/99/35, Dr. Leighton Ruff; POD 6 UGI 05/16/15:  No evidence of leak of oral contrast from the stomach on limited exam. Chronic knee pain/osteoarthritis previously on NSAID - Wheelchair bound at home Acute kidney injury - resolving New onset pulmonary edema 05/15/15, - BNP 3210 Mild dementia Hypertension Ongoing weight loss/PCM 04/17/15 per PCP Hypertension Hx of B12 deficiency Hypokalemia   3.1 Anemia H/H down to 7/21 today Hx of GERD Hx of breast cancer, with left mastectomy Antibiotics: Day 8 Cipro/Flagyl DVT: Heparin/SCD   Plan:  Continue lasix, Speech to see and make sure she is not aspirating, replace K+, and transfuse per Dr. Josem Kaufmann recommendation, I will set up 2 units and plan to give one today with IV lasix post op.  Leave foley in today. Check mag also. Appreciate Dr. Josem Kaufmann assistance.    LOS: 6 days    Alireza Pollack 05/17/2015

## 2015-05-17 NOTE — Evaluation (Signed)
Occupational Therapy Evaluation Patient Details Name: Elizabeth Peterson MRN: [id number] DOB: 1918-10-13 Today's Date: 05/17/2015    History of Present Illness LAPAROSCOPY DIAGNOSTIC converted open repair gastric ulcer and omental patch on 8/18.   Clinical Impression   Pt admitted with gastric ulcer. Pt currently with functional limitations due to the deficits listed below (see OT Problem List).  Pt will benefit from skilled OT to increase their safety and independence with ADL and functional mobility for ADL to facilitate discharge to venue listed below.      Follow Up Recommendations  SNF;Supervision/Assistance - 24 hour;Supervision - Intermittent    Equipment Recommendations  Other (comment) (will need to talk to family)       Precautions / Restrictions Precautions Precautions: Fall      Mobility Bed Mobility Overal bed mobility: Needs Assistance Bed Mobility: Rolling Rolling: Max assist            Transfers Overall transfer level: Needs assistance                    Balance                                            ADL Overall ADL's : Needs assistance/impaired Eating/Feeding: Moderate assistance;Bed level;Cueing for sequencing Eating/Feeding Details (indicate cue type and reason): HOB raised Grooming: Wash/dry face;Bed level Grooming Details (indicate cue type and reason): HOB raised                               General ADL Comments: pt in bed with breakfast.  Pt declined breakfast but with encouragment pt agreed to feed self.  Pt L handed  and was able to feed self with mod A.               Pertinent Vitals/Pain Pain Assessment: Faces Pain Score: 3  Faces Pain Scale: Hurts little more Pain Location: abdomen Pain Descriptors / Indicators: Aching;Sore Pain Intervention(s): Repositioned;Monitored during session     Hand Dominance     Extremity/Trunk Assessment Upper Extremity Assessment Upper Extremity  Assessment: Generalized weakness           Communication Communication Communication: No difficulties   Cognition Arousal/Alertness: Awake/alert Behavior During Therapy: WFL for tasks assessed/performed Overall Cognitive Status: History of cognitive impairments - at baseline                     General Comments       Exercises       Shoulder Instructions      Home Living Family/patient expects to be discharged to:: Unsure Living Arrangements: Children Available Help at Discharge: Family;Available 24 hours/day Type of Home: House                       Home Equipment: Bedside commode;Wheelchair - manual   Additional Comments: no family present      Prior Functioning/Environment Level of Independence: Needs assistance             OT Diagnosis: Generalized weakness;Acute pain   OT Problem List: Decreased strength;Decreased activity tolerance;Decreased cognition;Decreased safety awareness;Pain   OT Treatment/Interventions: Self-care/ADL training;DME and/or AE instruction;Patient/family education    OT Goals(Current goals can be found in the care plan section) ADL Goals Pt Will Perform Grooming: with supervision;sitting Pt Will  Transfer to Toilet: with min assist;stand pivot transfer Pt Will Perform Toileting - Clothing Manipulation and hygiene: with min assist;sit to/from stand  OT Frequency: Min 2X/week   Barriers to D/C:            Co-evaluation              End of Session Nurse Communication: Mobility status  Activity Tolerance: Patient tolerated treatment well Patient left: in bed   Time:  -    Charges:  OT General Charges $OT Visit: 1 Procedure OT Evaluation $Initial OT Evaluation Tier I: 1 Procedure G-Codes:    Payton Mccallum D 05/21/15, 3:00 PM

## 2015-05-17 NOTE — Progress Notes (Signed)
[facility] Consult progress note                                                                               Patient Demographics  Elizabeth Peterson, is a 81 y.o. female, DOB - 1919/06/08, HWE:[id number]  Admit date - 05/11/2015   Admitting Physician Leighton Ruff, MD  Outpatient Primary MD for the patient is REED, TIFFANY, DO  LOS - 6   Chief Complaint  Patient presents with  . Abdominal Pain       Brief HPI   Patient is a 81 year old female with hypertension, hyperlipidemia, dementia (confirmed with granddaughter at the bedside), GERD, anemi, wheelchair-bound due to osteoarthritis a had originally presented on 8/18 to the ED with moderate generalized abdominal pain for several weeks which worsened her day before the admission. She also had associated nausea and vomiting on the day of admission. Patient had significant tenderness with rebound and guarding and was found to have perforated gastric ulcer. Patient underwent laparoscopic repair of the gastric ulcer and an omental patch. Postoperative care was managed by critical care service, postop period was complicated by ongoing hypotension, acute kidney injury, worsening mental status superimposed on dementia. She was also noticed to have small pleural effusion with wheezing.  Palliative medicine was also consulted for goals of care. Internal medicine consulted today for medical comanagement.    Assessment & Plan    Active Problems: Dyspnea with pulmonary edema- much improved today - Unfortunately patient did not have PICC line until evening yesterday, hence did not receive IV Lasix, was placed on oral Lasix.  - I have placed her on IV Lasix until she is somewhat more euvolemic and balanced I's and O's.  - Patient to have thoracentesis today which will help. Swallow evaluation to rule out any aspiration. Leukocytosis is improving.   Gastric ulcer with perforation and open repair - Management per the primary  team  - continue IV PPI  Altered mental status with underlying Alzheimer's dementia- overall improving  - UA negative for any UTI, B12, folate, TSH all normal. Did not obtain CT head as mental status is improving.    Leukocytosis -  blood cultures negative to date, if patient does have aspiration and spiking fevers, will place on antibiotics otherwise hold off.   Pleural effusion, moderate right-sided, Asthmatic bronchitis - Continue scheduled nebs, IR for right-sided thoracentesis planned today  Hypertension - Currently BP stable, not on any antihypertensives  Anemia - Postop, agree with transfusion today, patient will have 20 mg IV Lasix after the transfusion     Disposition Plan: Per surgery   Time Spent in minutes 35 minutes  Procedures  Chest x-ray stat  DVT Prophylaxis  heparin subcutaneous  Medications  Scheduled Meds: . sodium chloride   Intravenous Once  . albuterol  2.5 mg Nebulization Q12H  . antiseptic oral rinse  7 mL Mouth Rinse q12n4p  . chlorhexidine  15 mL Mouth Rinse BID  . ciprofloxacin  400 mg Intravenous Q24H  . furosemide  20 mg Intravenous Once  . furosemide  40 mg Intravenous Daily  . heparin  5,000 Units Subcutaneous 3 times per day  .  metronidazole  500 mg Intravenous Q8H  . pantoprazole (PROTONIX) IV  80 mg Intravenous Q12H  . potassium chloride  20 mEq Oral BID WC   Continuous Infusions:  PRN Meds:.albuterol, fentaNYL (SUBLIMAZE) injection, iohexol, ondansetron **OR** ondansetron (ZOFRAN) IV, sodium chloride   Antibiotics   Anti-infectives    Start     Dose/Rate Route Frequency Ordered Stop   05/13/15 0600  ciprofloxacin (CIPRO) IVPB 400 mg     400 mg 200 mL/hr over 60 Minutes Intravenous Every 24 hours 05/12/15 0757 05/20/15 0559   05/12/15 0800  metroNIDAZOLE (FLAGYL) IVPB 500 mg     500 mg 100 mL/hr over 60 Minutes Intravenous Every 8 hours 05/12/15 0725 05/19/15 0759   05/12/15 0730  ciprofloxacin (CIPRO) IVPB 400 mg  Status:   Discontinued     400 mg 200 mL/hr over 60 Minutes Intravenous Every 12 hours 05/12/15 0725 05/12/15 0756   05/12/15 0600  ciprofloxacin (CIPRO) IVPB 400 mg     400 mg 200 mL/hr over 60 Minutes Intravenous Every 24 hours 05/11/15 1118 05/12/15 0648   05/11/15 1100  metroNIDAZOLE (FLAGYL) IVPB 500 mg     500 mg 100 mL/hr over 60 Minutes Intravenous Every 8 hours 05/11/15 1036 05/11/15 1227   05/11/15 1045  ciprofloxacin (CIPRO) IVPB 400 mg  Status:  Discontinued     400 mg 200 mL/hr over 60 Minutes Intravenous Every 12 hours 05/11/15 1036 05/11/15 1117   05/11/15 0515  ciprofloxacin (CIPRO) IVPB 400 mg     400 mg 200 mL/hr over 60 Minutes Intravenous  Once 05/11/15 0503 05/11/15 0656   05/11/15 0515  metroNIDAZOLE (FLAGYL) IVPB 500 mg     500 mg 100 mL/hr over 60 Minutes Intravenous  Once 05/11/15 0503 05/11/15 3419        Subjective:   Elizabeth Peterson was seen and examined  earlier  this exam. Alert and awake, lungs much clearer today, still somewhat confused however improving. Patient denies dizziness, chest pain, N/V, new weakness, numbess, tingling. No acute events overnight.No fevers.    Objective:   Blood pressure 129/64, pulse 100, temperature 97.5 F (36.4 C), temperature source Oral, resp. rate 17, height 5\' 3"  (1.6 m), weight 53.3 kg (117 lb 8.1 oz), SpO2 100 %.  Wt Readings from Last 3 Encounters:  05/12/15 53.3 kg (117 lb 8.1 oz)  12/06/13 50.44 kg (111 lb 3.2 oz)  09/06/13 48.988 kg (108 lb)     Intake/Output Summary (Last 24 hours) at 05/17/15 1028 Last data filed at 05/17/15 3790  Gross per 24 hour  Intake    490 ml  Output   2550 ml  Net  -2060 ml    Exam  General: Alert and oriented x2 NAD  HEENT:  PERRLA, EOMI, Anicteric Sclera, mucous membranes moist.   Neck: Supple, no JVD, no masses  CVS: S1 S2clear RRR   Respiratory:Decreased breath sounds at the bases, rt side dec, much improved from yesterday    Abdomen: Soft, dressing intact  Ext: no  cyanosis clubbing or edema  Neuro: no new deficits  Skin: No rashes  Psych: Normal affect and demeanor, alert and oriented x2   Data Review   Micro Results Recent Results (from the past 240 hour(s))  MRSA PCR Screening     Status: None   Collection Time: 05/11/15  9:55 AM  Result Value Ref Range Status   MRSA by PCR NEGATIVE NEGATIVE Final    Comment:        The GeneXpert  MRSA Assay (FDA approved for NASAL specimens only), is one component of a comprehensive MRSA colonization surveillance program. It is not intended to diagnose MRSA infection nor to guide or monitor treatment for MRSA infections.   Culture, blood (routine x 2)     Status: None (Preliminary result)   Collection Time: 05/15/15 10:15 PM  Result Value Ref Range Status   Specimen Description BLOOD LEFT ANTECUBITAL  Final   Special Requests IN PEDIATRIC BOTTLE [place]  Final   Culture   Final    NO GROWTH < 24 HOURS Performed at [facility]    Report Status PENDING  Incomplete    Radiology Reports Ct Abdomen Pelvis Wo Contrast  05/11/2015   CLINICAL DATA:  Generalized abdominal pain, worse last night.  EXAM: CT ABDOMEN AND PELVIS WITHOUT CONTRAST  TECHNIQUE: Multidetector CT imaging of the abdomen and pelvis was performed following the standard protocol without IV contrast.  COMPARISON:  None.  FINDINGS: BODY WALL: No contributory findings.  LOWER CHEST: Small bilateral pleural effusion with right basilar atelectasis. Small lower pneumomediastinum through hiatal hernia.  ABDOMEN/PELVIS:  Liver: Few sub cm low densities favor cysts.  Biliary: Haziness of fat around the gallbladder is likely secondary. No primary inflammation suspected.  Pancreas: Unremarkable.  Spleen: Unremarkable.  Adrenals: Unremarkable.  Kidneys and ureters: Mild bilateral renal cortical atrophy. No hydronephrosis.  Bladder: Distended without wall thickening.  Reproductive: Hysterectomy.  No adnexal mass.  Bowel: There is a large volume of  pneumoperitoneum distributed throughout the abdomen. Given the large volume, distribution is not helpful in identifying a source. Small volume ascites accompanies the fluid, with probable peritoneal thickening in the pelvic recesses. The source of perforation is uncertain; there is no definitive pneumatosis or ulceration. No inflammatory wall thickening around the gastric antrum or duodenum. The cecum is gas filled and ventrally rotated, but there is no bascule or volvulus and no associated pneumatosis. Oral contrast reaches the third portion duodenum and there is no contrast extravasation.  Vascular: No acute abnormality.  OSSEOUS: No acute abnormalities.  These results were called by telephone at the time of interpretation on 05/11/2015 at 5:02 am to Dr. Julianne Rice , who verbally acknowledged these results.  IMPRESSION: 1. Perforated bowel with large pneumoperitoneum and small ascites. The site of perforation is uncertain; no extravasation of oral contrast which reaches the third portion duodenum. Colonic diverticulosis is present. 2. Atelectasis and small bilateral pleural effusion.   Electronically Signed   By: Monte Fantasia M.D.   On: 05/11/2015 05:07   Dg Chest Port 1 View  05/16/2015   CLINICAL DATA:  Increasing short of breath  EXAM: PORTABLE CHEST - 1 VIEW  COMPARISON:  05/14/2015  FINDINGS: The heart is moderately enlarged. Pulmonary edema has developed. Opacity at the right base is stable likely a combination of volume loss, consolidation, and pleural fluid. Atelectasis at the left base is stable. Tiny left pleural effusion is not excluded.  IMPRESSION: New onset of pulmonary edema. Bibasilar atelectasis and right pleural effusion are stable.   Electronically Signed   By: Marybelle Killings M.D.   On: 05/16/2015 08:19   Dg Chest Port 1 View  05/14/2015   CLINICAL DATA:  Shortness of breath  EXAM: PORTABLE CHEST - 1 VIEW  COMPARISON:  05/12/2015  FINDINGS: Nasogastric tube tip has been removed.  Dilated colon again seen in the left abdomen.  Bilateral basilar opacification, greater on the right. Appearance suggests associated pleural fluid. Unchanged cardiopericardial enlargement. Stable upper mediastinal contours.  IMPRESSION: Stable bibasilar lung opacity, greater on the right. Admission abdominal CT favors pleural fluid and atelectasis.   Electronically Signed   By: Monte Fantasia M.D.   On: 05/14/2015 06:14   Dg Chest Port 1 View  05/12/2015   CLINICAL DATA:  Respiratory failure.  EXAM: PORTABLE CHEST - 1 VIEW  COMPARISON:  05/11/2015.  FINDINGS: NG tube noted coiled in stomach. Mediastinum hilar structures are normal. Heart size stable. Right lower lobe infiltrate consistent pneumonia. Right pleural effusion. Subsegmental atelectasis left lung base. No pneumothorax. Surgical clip left upper abdomen.  IMPRESSION: 1.  NG tube noted coiled in stomach.  2. Right lower lobe infiltrate consistent pneumonia. Moderate right pleural effusion.   Electronically Signed   By: Marcello Moores  Register   On: 05/12/2015 07:10   Dg Chest Port 1 View  05/11/2015   CLINICAL DATA:  Hypoxia  EXAM: PORTABLE CHEST - 1 VIEW  COMPARISON:  02/28/2013  FINDINGS: Normal heart size and aortic contours.  Atelectasis in the right upper lobe seen on the previous study has resolved. There is an indistinct opacity at the right base obscuring the diaphragm which is elevated compared to prior. No edema, effusion, or air leak.  IMPRESSION: Hazy opacity at the right base favoring atelectasis. This area should be seen on abdominal CT scheduled for later today.   Electronically Signed   By: Monte Fantasia M.D.   On: 05/11/2015 03:41   Dg Duanne Limerick W/water Sol Cm  05/16/2015   CLINICAL DATA:  Patient status post repair per stroke perforated gastric ulcer with omental patch.  EXAM: WATER SOLUBLE UPPER GI SERIES  TECHNIQUE: Single-column upper GI series was performed using water soluble contrast.  CONTRAST:  54mL OMNIPAQUE IOHEXOL 300 MG/ML  SOLN   COMPARISON:  None.  FLUOROSCOPY TIME:  Radiation Exposure Index (as provided by the fluoroscopic device):  If the device does not provide the exposure index:  Fluoroscopy Time (in minutes and seconds):  1 minutes 52 seconds  Number of Acquired Images:  8  FINDINGS: Exam is suboptimal due to limited intake of the oral contrast as well as limited patient mobility. The contrast does flow easily into the stomach and ultimately passes into the first and second second portion of the duodenum. Stomach is gas-filled. No evidence of leak of the oral contrast.  IMPRESSION: No evidence of leak of oral contrast from the stomach on limited exam.   Electronically Signed   By: Suzy Bouchard M.D.   On: 05/16/2015 15:04    CBC  Recent Labs Lab 05/12/15 0414 05/13/15 0350 05/14/15 1028 05/16/15 0705 05/17/15 0513  WBC 13.7* 18.8* 20.2* 11.7* 12.8*  HGB 10.2* 7.8* 8.6* 7.9* 7.0*  HCT 32.2* 24.1* 26.9* 23.6* 21.2*  PLT PLATELET CLUMPS NOTED ON SMEAR, COUNT APPEARS ADEQUATE 267 262 266 268  MCV 92.8 92.3 93.7 91.8 91.8  MCH 29.4 29.9 30.0 30.7 30.3  MCHC 31.7 32.4 32.0 33.5 33.0  RDW 14.0 14.4 15.0 15.2 15.3    Chemistries   Recent Labs Lab 05/11/15 0305 05/12/15 0414 05/13/15 0350 05/14/15 1028 05/16/15 0705 05/17/15 0513  NA 139 141 141 142 144 145  K 3.1* 4.0 3.2* 3.7 3.2* 3.1*  CL 107 114* 121* 123* 120* 119*  CO2 16* 14* 15* 15* 15* 18*  GLUCOSE 157* 24* 91 90 111* 83  BUN 20 27* 27* 17 13 13   CREATININE 1.72* 1.57* 1.37* 1.18* 1.04* 1.02*  CALCIUM 9.0 7.4* 7.1* 7.6* 7.5* 7.3*  MG  --   --  1.4*  --   --   --   AST 36  --   --   --   --   --   ALT 10*  --   --   --   --   --   ALKPHOS 51  --   --   --   --   --   BILITOT 2.3*  --   --   --   --   --    ------------------------------------------------------------------------------------------------------------------ estimated creatinine clearance is 27.9 mL/min (by C-G formula based on Cr of  1.02). ------------------------------------------------------------------------------------------------------------------ No results for input(s): HGBA1C in the last 72 hours. ------------------------------------------------------------------------------------------------------------------ No results for input(s): CHOL, HDL, LDLCALC, TRIG, CHOLHDL, LDLDIRECT in the last 72 hours. ------------------------------------------------------------------------------------------------------------------  Recent Labs  05/15/15 2215  TSH 3.488   ------------------------------------------------------------------------------------------------------------------  Recent Labs  05/15/15 2215  VITAMINB12 2124*  FOLATE 6.7    Coagulation profile No results for input(s): INR, PROTIME in the last 168 hours.  No results for input(s): DDIMER in the last 72 hours.  Cardiac Enzymes No results for input(s): CKMB, TROPONINI, MYOGLOBIN in the last 168 hours.  Invalid input(s): CK ------------------------------------------------------------------------------------------------------------------ Invalid input(s): [place]  05/16/15 1702 05/16/15 1747 05/16/15 2132 05/16/15 2334 05/17/15 0355 05/17/15 0752  GLUCAP 66 76 91 92 85 84     Renesmee Raine M.D. [facility] 05/17/2015, 10:28 AM  Pager: [phone] Between 7am to 7pm - call Pager - [phone]  After 7pm go to [email or web address] - password [email or web address]  Call night coverage person covering after 7pm

## 2015-05-17 NOTE — Progress Notes (Signed)
   Assessed right pleural space with ultrasound at bedside.  See picture below.  Small amount of pleural fluid in space - not enough to perform thoracentesis.  Continue diuretics as renal function / BP permit.       Noe Gens, NP-C [facility] Pgr: [phone] or if no answer [phone] 05/17/2015, 12:03 PM

## 2015-05-18 ENCOUNTER — Inpatient Hospital Stay (HOSPITAL_COMMUNITY): Payer: Medicare Other

## 2015-05-18 LAB — BASIC METABOLIC PANEL
ANION GAP: 5 (ref 5–15)
BUN: 11 mg/dL (ref 6–20)
CALCIUM: 7.1 mg/dL — AB (ref 8.9–10.3)
CO2: 21 mmol/L — ABNORMAL LOW (ref 22–32)
Chloride: 115 mmol/L — ABNORMAL HIGH (ref 101–111)
Creatinine, Ser: 1.04 mg/dL — ABNORMAL HIGH (ref 0.44–1.00)
GFR, EST AFRICAN AMERICAN: 52 mL/min — AB (ref >60)
GFR, EST NON AFRICAN AMERICAN: 45 mL/min — AB (ref >60)
GLUCOSE: 88 mg/dL (ref 65–99)
POTASSIUM: 3 mmol/L — AB (ref 3.5–5.1)
Sodium: 141 mmol/L (ref 135–145)

## 2015-05-18 LAB — CBC
HEMATOCRIT: 25.1 % — AB (ref 36.0–46.0)
HEMOGLOBIN: 8.6 g/dL — AB (ref 12.0–15.0)
MCH: 30.8 pg (ref 26.0–34.0)
MCHC: 34.3 g/dL (ref 30.0–36.0)
MCV: 90 fL (ref 78.0–100.0)
Platelets: 278 10*3/uL (ref 150–400)
RBC: 2.79 MIL/uL — AB (ref 3.87–5.11)
RDW: 14.9 % (ref 11.5–15.5)
WBC: 14.9 10*3/uL — AB (ref 4.0–10.5)

## 2015-05-18 LAB — GLUCOSE, CAPILLARY
GLUCOSE-CAPILLARY: 105 mg/dL — AB (ref 65–99)
GLUCOSE-CAPILLARY: 64 mg/dL — AB (ref 65–99)
GLUCOSE-CAPILLARY: 69 mg/dL (ref 65–99)
GLUCOSE-CAPILLARY: 78 mg/dL (ref 65–99)
GLUCOSE-CAPILLARY: 80 mg/dL (ref 65–99)
GLUCOSE-CAPILLARY: 82 mg/dL (ref 65–99)
Glucose-Capillary: 75 mg/dL (ref 65–99)
Glucose-Capillary: 66 mg/dL (ref 65–99)
Glucose-Capillary: 67 mg/dL (ref 65–99)

## 2015-05-18 LAB — MAGNESIUM: MAGNESIUM: 1.6 mg/dL — AB (ref 1.7–2.4)

## 2015-05-18 LAB — H. PYLORI ANTIBODY, IGG: H PYLORI IGG: 0.9 U/mL — AB (ref 0.0–0.8)

## 2015-05-18 MED ORDER — VITAMIN B-12 1000 MCG PO TABS: 1000.0000 ug | ORAL_TABLET | Freq: Two times a day (BID) | ORAL | Status: DC

## 2015-05-18 MED ORDER — ACETAMINOPHEN 325 MG PO TABS
325.0000 mg | ORAL_TABLET | Freq: Four times a day (QID) | ORAL | Status: DC | PRN
  Administered 2015-05-22: 650 mg via ORAL
  Filled 2015-05-18: qty 2

## 2015-05-18 MED ORDER — IOHEXOL 300 MG/ML  SOLN
25.0000 mL | INTRAMUSCULAR | Status: AC
  Administered 2015-05-18 (×2): 25 mL via ORAL

## 2015-05-18 MED ORDER — MAGNESIUM SULFATE 50 % IJ SOLN
3.0000 g | Freq: Once | INTRAVENOUS | Status: AC
  Administered 2015-05-18: 3 g via INTRAVENOUS
  Filled 2015-05-18: qty 6

## 2015-05-18 MED ORDER — MIRTAZAPINE 15 MG PO TABS
15.0000 mg | ORAL_TABLET | Freq: Every day | ORAL | Status: DC
  Administered 2015-05-18 – 2015-05-21 (×4): 15 mg via ORAL
  Filled 2015-05-18 (×6): qty 1

## 2015-05-18 MED ORDER — POTASSIUM CHLORIDE CRYS ER 20 MEQ PO TBCR
40.0000 meq | EXTENDED_RELEASE_TABLET | Freq: Once | ORAL | Status: AC
  Administered 2015-05-18: 40 meq via ORAL
  Filled 2015-05-18: qty 2

## 2015-05-18 MED ORDER — TRAMADOL HCL 50 MG PO TABS
50.0000 mg | ORAL_TABLET | Freq: Two times a day (BID) | ORAL | Status: DC | PRN
  Administered 2015-05-18 – 2015-05-22 (×7): 50 mg via ORAL
  Filled 2015-05-18 (×9): qty 1

## 2015-05-18 NOTE — Progress Notes (Signed)
Clinical Social Work  CSW to assist with DC planning. Per chart review, PT recommends HH/24supervision vs SNF placement. CSW went to room but patient sleeping and no family present. Per chart review, granddtr Olin Hauser) is decision maker for patient. CSW called and left a message with family and left SNF list with CSW contact information in room as well. FL2 completed and placed on chart for MD signature. CSW faxed clinicals to [facility] in case SNF placement is desired.  CSW will continue to follow and full assessment to follow.  [place], [place] [phone]

## 2015-05-18 NOTE — Progress Notes (Signed)
[facility] Consult progress note                                                                               Patient Demographics  Elizabeth Peterson, is a 81 y.o. female, DOB - [birth year], WCB:[id number]  Admit date - 05/11/2015   Admitting Physician Leighton Ruff, MD  Outpatient Primary MD for the patient is REED, TIFFANY, DO  LOS - 7   Chief Complaint  Patient presents with  . Abdominal Pain       Brief HPI   Patient is a 82 year old female with hypertension, hyperlipidemia, dementia (confirmed with granddaughter at the bedside), GERD, anemi, wheelchair-bound due to osteoarthritis a had originally presented on 8/18 to the ED with moderate generalized abdominal pain for several weeks which worsened her day before the admission. She also had associated nausea and vomiting on the day of admission. Patient had significant tenderness with rebound and guarding and was found to have perforated gastric ulcer. Patient underwent laparoscopic repair of the gastric ulcer and an omental patch. Postoperative care was managed by critical care service, postop period was complicated by ongoing hypotension, acute kidney injury, worsening mental status superimposed on dementia. She was also noticed to have small pleural effusion with wheezing.  Palliative medicine was also consulted for goals of care. Internal medicine consulted today for medical comanagement.    Assessment & Plan    Active Problems: Dyspnea with pulmonary edema- shortness of breath has significantly improved - Continue IV Lasix until euvolemic and balanced I's and O's. Still 10.7 L positive - Reviewed pulmonology note, did not need thoracentesis - Swallow evaluation done, patient has aspiration, recommending dysphagia 3 diet. Lungs however are clear.  - Given worsening leukocytosis, I have also added two-view chest x-ray to rule out any pneumonia  - Minimize narcotics  Gastric ulcer with perforation and open  repair - Management per the primary team  - continue IV PPI  Altered mental status with underlying Alzheimer's dementia- overall improving  - UA negative for any UTI, B12, folate, TSH all normal. Did not obtain CT head as mental status is improving.    Leukocytosis -  blood cultures negative to date,  ordered two-view chest x-ray - Continue IV Cipro and Flagyl, follow UA, surgery team planning CT of the abdomen   Pleural effusion, moderate right-sided, Asthmatic bronchitis Reviewed pulmonology recommendations, did not need thoracentesis  Anemia Postop H&H is stable, 8.6  Hypokalemia On daily replacement, added extra dose   disposition Plan: Per surgery   Time Spent in minutes 26mins   Procedures  Chest x-ray  DVT Prophylaxis  heparin subcutaneous  Medications  Scheduled Meds: . albuterol  2.5 mg Nebulization Q12H  . antiseptic oral rinse  7 mL Mouth Rinse q12n4p  . chlorhexidine  15 mL Mouth Rinse BID  . ciprofloxacin  400 mg Intravenous Q24H  . furosemide  40 mg Intravenous Daily  . heparin  5,000 Units Subcutaneous 3 times per day  . iohexol  25 mL Oral Q1 Hr x 2  . magnesium sulfate LVP 250-500 ml  3 g Intravenous Once  . metronidazole  500 mg Intravenous Q8H  .  mirtazapine  15 mg Oral QHS  . pantoprazole (PROTONIX) IV  80 mg Intravenous Q12H  . potassium chloride  20 mEq Oral BID WC   Continuous Infusions:  PRN Meds:.acetaminophen, albuterol, fentaNYL (SUBLIMAZE) injection, iohexol, ondansetron **OR** ondansetron (ZOFRAN) IV, sodium chloride, traMADol   Antibiotics   Anti-infectives    Start     Dose/Rate Route Frequency Ordered Stop   05/13/15 0600  ciprofloxacin (CIPRO) IVPB 400 mg     400 mg 200 mL/hr over 60 Minutes Intravenous Every 24 hours 05/12/15 0757 05/20/15 0559   05/12/15 0800  metroNIDAZOLE (FLAGYL) IVPB 500 mg     500 mg 100 mL/hr over 60 Minutes Intravenous Every 8 hours 05/12/15 0725 05/19/15 0759   05/12/15 0730  ciprofloxacin (CIPRO)  IVPB 400 mg  Status:  Discontinued     400 mg 200 mL/hr over 60 Minutes Intravenous Every 12 hours 05/12/15 0725 05/12/15 0756   05/12/15 0600  ciprofloxacin (CIPRO) IVPB 400 mg     400 mg 200 mL/hr over 60 Minutes Intravenous Every 24 hours 05/11/15 1118 05/12/15 0648   05/11/15 1100  metroNIDAZOLE (FLAGYL) IVPB 500 mg     500 mg 100 mL/hr over 60 Minutes Intravenous Every 8 hours 05/11/15 1036 05/11/15 1227   05/11/15 1045  ciprofloxacin (CIPRO) IVPB 400 mg  Status:  Discontinued     400 mg 200 mL/hr over 60 Minutes Intravenous Every 12 hours 05/11/15 1036 05/11/15 1117   05/11/15 0515  ciprofloxacin (CIPRO) IVPB 400 mg     400 mg 200 mL/hr over 60 Minutes Intravenous  Once 05/11/15 0503 05/11/15 0656   05/11/15 0515  metroNIDAZOLE (FLAGYL) IVPB 500 mg     500 mg 100 mL/hr over 60 Minutes Intravenous  Once 05/11/15 0503 05/11/15 9485        Subjective:   Elizabeth Peterson was seen and examined  earlier  this exam.  somewhat sleepy and lethargic, no fevers or chills, no nausea or vomiting, lungs clear. Patient denies dizziness, chest pain, N/V, new weakness, numbess, tingling. No acute events overnight.No fevers.    Objective:   Blood pressure 116/56, pulse 96, temperature 98.8 F (37.1 C), temperature source Oral, resp. rate 19, height 5\' 3"  (1.6 m), weight 53.3 kg (117 lb 8.1 oz), SpO2 100 %.  Wt Readings from Last 3 Encounters:  05/12/15 53.3 kg (117 lb 8.1 oz)  12/06/13 50.44 kg (111 lb 3.2 oz)  09/06/13 48.988 kg (108 lb)     Intake/Output Summary (Last 24 hours) at 05/18/15 1102 Last data filed at 05/18/15 1031  Gross per 24 hour  Intake 1231.07 ml  Output   3000 ml  Net -1768.93 ml    Exam  General:  somewhat sleepy and lethargic today  HEENT:  PERRLA, EOMI  Neck: Supple, no JVD, no masses  CVS: S1 S2clear RRR   Respiratory:  fairly clear to auscultation bilaterally   Abdomen: Soft, dressing intact  Ext: no cyanosis clubbing or edema  Neuro: no new  deficits  Skin: No rashes  Psych: somewhat lethargic   Data Review   Micro Results Recent Results (from the past 240 hour(s))  MRSA PCR Screening     Status: None   Collection Time: 05/11/15  9:55 AM  Result Value Ref Range Status   MRSA by PCR NEGATIVE NEGATIVE Final    Comment:        The GeneXpert MRSA Assay (FDA approved for NASAL specimens only), is one component of a comprehensive MRSA colonization surveillance  program. It is not intended to diagnose MRSA infection nor to guide or monitor treatment for MRSA infections.   Culture, blood (routine x 2)     Status: None (Preliminary result)   Collection Time: 05/15/15 10:15 PM  Result Value Ref Range Status   Specimen Description BLOOD LEFT ANTECUBITAL  Final   Special Requests IN PEDIATRIC BOTTLE [place]  Final   Culture   Final    NO GROWTH 2 DAYS Performed at [facility]    Report Status PENDING  Incomplete  Culture, blood (routine x 2)     Status: None (Preliminary result)   Collection Time: 05/16/15  7:05 AM  Result Value Ref Range Status   Specimen Description BLOOD RIGHT ARM  Final   Special Requests IN PEDIATRIC BOTTLE 3CC  Final   Culture   Final    NO GROWTH 1 DAY Performed at [facility]    Report Status PENDING  Incomplete    Radiology Reports Ct Abdomen Pelvis Wo Contrast  05/11/2015   CLINICAL DATA:  Generalized abdominal pain, worse last night.  EXAM: CT ABDOMEN AND PELVIS WITHOUT CONTRAST  TECHNIQUE: Multidetector CT imaging of the abdomen and pelvis was performed following the standard protocol without IV contrast.  COMPARISON:  None.  FINDINGS: BODY WALL: No contributory findings.  LOWER CHEST: Small bilateral pleural effusion with right basilar atelectasis. Small lower pneumomediastinum through hiatal hernia.  ABDOMEN/PELVIS:  Liver: Few sub cm low densities favor cysts.  Biliary: Haziness of fat around the gallbladder is likely secondary. No primary inflammation suspected.  Pancreas:  Unremarkable.  Spleen: Unremarkable.  Adrenals: Unremarkable.  Kidneys and ureters: Mild bilateral renal cortical atrophy. No hydronephrosis.  Bladder: Distended without wall thickening.  Reproductive: Hysterectomy.  No adnexal mass.  Bowel: There is a large volume of pneumoperitoneum distributed throughout the abdomen. Given the large volume, distribution is not helpful in identifying a source. Small volume ascites accompanies the fluid, with probable peritoneal thickening in the pelvic recesses. The source of perforation is uncertain; there is no definitive pneumatosis or ulceration. No inflammatory wall thickening around the gastric antrum or duodenum. The cecum is gas filled and ventrally rotated, but there is no bascule or volvulus and no associated pneumatosis. Oral contrast reaches the third portion duodenum and there is no contrast extravasation.  Vascular: No acute abnormality.  OSSEOUS: No acute abnormalities.  These results were called by telephone at the time of interpretation on 05/11/2015 at 5:02 am to Dr. Julianne Rice , who verbally acknowledged these results.  IMPRESSION: 1. Perforated bowel with large pneumoperitoneum and small ascites. The site of perforation is uncertain; no extravasation of oral contrast which reaches the third portion duodenum. Colonic diverticulosis is present. 2. Atelectasis and small bilateral pleural effusion.   Electronically Signed   By: Monte Fantasia M.D.   On: 05/11/2015 05:07   Dg Chest Port 1 View  05/16/2015   CLINICAL DATA:  Increasing short of breath  EXAM: PORTABLE CHEST - 1 VIEW  COMPARISON:  05/14/2015  FINDINGS: The heart is moderately enlarged. Pulmonary edema has developed. Opacity at the right base is stable likely a combination of volume loss, consolidation, and pleural fluid. Atelectasis at the left base is stable. Tiny left pleural effusion is not excluded.  IMPRESSION: New onset of pulmonary edema. Bibasilar atelectasis and right pleural effusion  are stable.   Electronically Signed   By: Marybelle Killings M.D.   On: 05/16/2015 08:19   Dg Chest Port 1 View  05/14/2015  CLINICAL DATA:  Shortness of breath  EXAM: PORTABLE CHEST - 1 VIEW  COMPARISON:  05/12/2015  FINDINGS: Nasogastric tube tip has been removed. Dilated colon again seen in the left abdomen.  Bilateral basilar opacification, greater on the right. Appearance suggests associated pleural fluid. Unchanged cardiopericardial enlargement. Stable upper mediastinal contours.  IMPRESSION: Stable bibasilar lung opacity, greater on the right. Admission abdominal CT favors pleural fluid and atelectasis.   Electronically Signed   By: Monte Fantasia M.D.   On: 05/14/2015 06:14   Dg Chest Port 1 View  05/12/2015   CLINICAL DATA:  Respiratory failure.  EXAM: PORTABLE CHEST - 1 VIEW  COMPARISON:  05/11/2015.  FINDINGS: NG tube noted coiled in stomach. Mediastinum hilar structures are normal. Heart size stable. Right lower lobe infiltrate consistent pneumonia. Right pleural effusion. Subsegmental atelectasis left lung base. No pneumothorax. Surgical clip left upper abdomen.  IMPRESSION: 1.  NG tube noted coiled in stomach.  2. Right lower lobe infiltrate consistent pneumonia. Moderate right pleural effusion.   Electronically Signed   By: Marcello Moores  Register   On: 05/12/2015 07:10   Dg Chest Port 1 View  05/11/2015   CLINICAL DATA:  Hypoxia  EXAM: PORTABLE CHEST - 1 VIEW  COMPARISON:  02/28/2013  FINDINGS: Normal heart size and aortic contours.  Atelectasis in the right upper lobe seen on the previous study has resolved. There is an indistinct opacity at the right base obscuring the diaphragm which is elevated compared to prior. No edema, effusion, or air leak.  IMPRESSION: Hazy opacity at the right base favoring atelectasis. This area should be seen on abdominal CT scheduled for later today.   Electronically Signed   By: Monte Fantasia M.D.   On: 05/11/2015 03:41   Dg Duanne Limerick W/water Sol Cm  05/16/2015   CLINICAL  DATA:  Patient status post repair per stroke perforated gastric ulcer with omental patch.  EXAM: WATER SOLUBLE UPPER GI SERIES  TECHNIQUE: Single-column upper GI series was performed using water soluble contrast.  CONTRAST:  49mL OMNIPAQUE IOHEXOL 300 MG/ML  SOLN  COMPARISON:  None.  FLUOROSCOPY TIME:  Radiation Exposure Index (as provided by the fluoroscopic device):  If the device does not provide the exposure index:  Fluoroscopy Time (in minutes and seconds):  1 minutes 52 seconds  Number of Acquired Images:  8  FINDINGS: Exam is suboptimal due to limited intake of the oral contrast as well as limited patient mobility. The contrast does flow easily into the stomach and ultimately passes into the first and second second portion of the duodenum. Stomach is gas-filled. No evidence of leak of the oral contrast.  IMPRESSION: No evidence of leak of oral contrast from the stomach on limited exam.   Electronically Signed   By: Suzy Bouchard M.D.   On: 05/16/2015 15:04    CBC  Recent Labs Lab 05/13/15 0350 05/14/15 1028 05/16/15 0705 05/17/15 0513 05/18/15 0605  WBC 18.8* 20.2* 11.7* 12.8* 14.9*  HGB 7.8* 8.6* 7.9* 7.0* 8.6*  HCT 24.1* 26.9* 23.6* 21.2* 25.1*  PLT 267 262 266 268 278  MCV 92.3 93.7 91.8 91.8 90.0  MCH 29.9 30.0 30.7 30.3 30.8  MCHC 32.4 32.0 33.5 33.0 34.3  RDW 14.4 15.0 15.2 15.3 14.9    Chemistries   Recent Labs Lab 05/13/15 0350 05/14/15 1028 05/16/15 0705 05/17/15 0513 05/18/15 0605  NA 141 142 144 145 141  K 3.2* 3.7 3.2* 3.1* 3.0*  CL 121* 123* 120* 119* 115*  CO2 15* 15*  15* 18* 21*  GLUCOSE 91 90 111* 83 88  BUN 27* 17 13 13 11   CREATININE 1.37* 1.18* 1.04* 1.02* 1.04*  CALCIUM 7.1* 7.6* 7.5* 7.3* 7.1*  MG 1.4*  --   --  1.3* 1.6*   ------------------------------------------------------------------------------------------------------------------ estimated creatinine clearance is 27.4 mL/min (by C-G formula based on Cr of  1.04). ------------------------------------------------------------------------------------------------------------------ No results for input(s): HGBA1C in the last 72 hours. ------------------------------------------------------------------------------------------------------------------ No results for input(s): CHOL, HDL, LDLCALC, TRIG, CHOLHDL, LDLDIRECT in the last 72 hours. ------------------------------------------------------------------------------------------------------------------  Recent Labs  05/15/15 2215  TSH 3.488   ------------------------------------------------------------------------------------------------------------------  Recent Labs  05/15/15 2215  VITAMINB12 2124*  FOLATE 6.7    Coagulation profile No results for input(s): INR, PROTIME in the last 168 hours.  No results for input(s): DDIMER in the last 72 hours.  Cardiac Enzymes No results for input(s): CKMB, TROPONINI, MYOGLOBIN in the last 168 hours.  Invalid input(s): CK ------------------------------------------------------------------------------------------------------------------ Invalid input(s): [place]  05/17/15 1217 05/17/15 1647 05/17/15 2000 05/18/15 0004 05/18/15 0358 05/18/15 0741  GLUCAP 39 77 113* 58 64* 18     Narjis Mira M.D. [facility] 05/18/2015, 11:02 AM  Pager: [phone] Between 7am to 7pm - call Pager - [phone]  After 7pm go to [email or web address] - password [email or web address]  Call night coverage person covering after 7pm

## 2015-05-18 NOTE — Progress Notes (Addendum)
Physical Therapy Treatment Patient Details Name: Elizabeth Peterson MRN: [id number] DOB: 09/30/1918 Today's Date: 05/18/2015    History of Present Illness LAPAROSCOPY DIAGNOSTIC converted open repair gastric ulcer and omental patch on 8/18.    PT Comments    Pt continues to require max-total assist +2 for mobility. Family will need to be able to provide increased level of assistance if pt returns home. If family is unable, then will need SNF placement.  Follow Up Recommendations  Home health PT;Supervision/Assistance - 24 hour vs SNF      Equipment Recommendations  Hospital bed    Recommendations for Other Services       Precautions / Restrictions Precautions Precautions: Fall Precaution Comments: abd. incision, knees contracted Restrictions Weight Bearing Restrictions: No    Mobility  Bed Mobility Overal bed mobility: Needs Assistance Bed Mobility: Supine to Sit     Supine to sit: Max assist;+2 for physical assistance;+2 for safety/equipment     General bed mobility comments: Assist for trunk to upright and bil LEs off bed. Utilized bedpad for positioning. Increased time. Pt c/o bil LE pain mainly. Multimodal cues for participation.  Transfers Overall transfer level: Needs assistance   Transfers: Lateral/Scoot Transfers          Lateral/Scoot Transfers: Total assist;+2 physical assistance;+2 safety/equipment General transfer comment: Lateral scoot, bed to recliner, with dropped arm. Increased time.   Ambulation/Gait             General Gait Details: NT-pt non ambulatory   Stairs            Wheelchair Mobility    Modified Rankin (Stroke Patients Only)       Balance   Sitting-balance support: Bilateral upper extremity supported Sitting balance-Leahy Scale: Fair                              Cognition Arousal/Alertness: Awake/alert Behavior During Therapy: WFL for tasks assessed/performed Overall Cognitive Status: History of  cognitive impairments - at baseline                      Exercises      General Comments        Pertinent Vitals/Pain Pain Assessment: Faces Faces Pain Scale: Hurts even more Pain Location: legs Pain Descriptors / Indicators: Aching;Sore Pain Intervention(s): Monitored during session;Repositioned    Home Living                      Prior Function            PT Goals (current goals can now be found in the care plan section) Progress towards PT goals: Not progressing toward goals - comment (pt continues to require Max-total assist +2)    Frequency  Min 2X/week    PT Plan Current plan remains appropriate    Co-evaluation             End of Session   Activity Tolerance: Patient limited by pain Patient left: in chair;with call bell/phone within reach (with nursing in room)     Time: 9373-4287 PT Time Calculation (min) (ACUTE ONLY): 15 min  Charges:  $Therapeutic Activity: 8-22 mins                    G Codes:      Weston Anna, MPT Pager: [phone]

## 2015-05-18 NOTE — Evaluation (Signed)
Clinical/Bedside Swallow Evaluation Patient Details  Name: Elizabeth Peterson MRN: [id number] Date of Birth: 25-Mar-1919  Today's Date: 05/18/2015 Time: SLP Start Time (ACUTE ONLY): 1010 SLP Stop Time (ACUTE ONLY): 1036 SLP Time Calculation (min) (ACUTE ONLY): 26 min  Past Medical History:  Past Medical History  Diagnosis Date  . HYPERLIPIDEMIA 04/16/2007  . ANEMIA-NOS 04/16/2007  . ANXIETY 04/16/2007  . DEPRESSION 09/03/2007  . HYPERTENSION 04/16/2007  . ATHEROSLERO NATV ART EXTREM W/INTERMIT CLAUDICAT 06/05/2010  . ALLERGIC RHINITIS 02/27/2009  . PNEUMONIA, COMMUNITY ACQUIRED, PNEUMOCOCCAL 12/07/2008  . ASTHMA 09/03/2007  . GERD 04/16/2007  . DIVERTICULOSIS, COLON 09/03/2007  . MENOPAUSAL DISORDER 04/18/2008  . OSTEOARTHRITIS, KNEE, LEFT 11/24/2009  . JOINT EFFUSION, LEFT KNEE 02/27/2009  . KNEE PAIN, BILATERAL 06/01/2010  . LOW BACK PAIN 04/16/2007  . LEG PAIN, LEFT 06/01/2010  . WEIGHT LOSS 07/25/2008  . RASH-NONVESICULAR 07/25/2008  . Abdominal pain, generalized 09/03/2007  . BREAST CANCER, HX OF 04/16/2007  . COLONIC POLYPS, HX OF 09/03/2007  . Dementia 06/17/2012    mild  . Peripheral neuropathy 06/17/2012   Past Surgical History:  Past Surgical History  Procedure Laterality Date  . Abdominal hysterectomy  1966  . Tubal ligation    . S/p left mastectomy  2011  . Laparoscopy N/A 05/11/2015    Procedure: LAPAROSCOPY DIAGNOSTIC converted open repair gastric ulcerand omental patch;  Surgeon: Leighton Ruff, MD;  Location: [facility];  Service: General;  Laterality: N/A;   HPI:  81 yo female adm to [facility] with h/o dementia adm for abdominal pain - perforated ulcer.  Pt is s/p surgical repair. Swallow evaluation ordered to evaluate for possible aspiration risk.  Pt currently on full liquid diet with good tolerance per RN.    Assessment / Plan / Recommendation Clinical Impression  Pt without CN deficit nor indication of airway compromise/pharyngeal residuals with contrast consumption via straw.  Minimal  delay in swallow noted suspected due to cognitive deficit.  SLP did not test solids due to abd CT pending, however pt's speech is intelligible without significant dysarthria.  As pt is edentulous, recommend dys3/thin when md deems appropriate.  Of note, pt has h/o presbyesophagus with mod CP defect on UGI in 2008 - therefore strict reflux/esophageal precautions recommended.  Educated pt to compensation strategies to use if dysphagia is apparent - she denies dysphagia symptoms.  Pt and RN, nursing student/instructor informed to findings of prior testing and indication for precautions.  Signs placed in [facility] for pt's room when diet advanced. SLP to sign off, thanks for this order.     Aspiration Risk  Mild    Diet Recommendation Dysphagia 3 (Mech soft);Thin   Medication Administration: Whole meds with liquid Compensations: Slow rate;Small sips/bites    Other  Recommendations Oral Care Recommendations: Oral care BID   Follow Up Recommendations       Frequency and Duration        Pertinent Vitals/Pain Afebrile, decreased      Swallow Study Prior Functional Status   eats soft foods due to edentulous    General Date of Onset: 05/18/15 Other Pertinent Information: 81 yo female adm to [facility] with h/o dementia adm for abdominal pain - perforated ulcer.  Pt is s/p surgical repair. Swallow evaluation ordered to evaluate for possible aspiration risk.  Pt currently on full liquid diet with good tolerance per RN.  Type of Study: Bedside swallow evaluation Diet Prior to this Study:  (full liquid, but pt able to consume contrast only currently for CT Abdomen)  Temperature Spikes Noted: No Respiratory Status: Room air History of Recent Intubation: No Behavior/Cognition: Alert;Cooperative;Pleasant mood Oral Cavity - Dentition: Edentulous (pt reports she does not use dentures) Self-Feeding Abilities: Needs assist Patient Positioning: Upright in bed (as much as able due to pain) Baseline Vocal  Quality: Normal Volitional Cough: Other (Comment) (did not test due to pt abdomen sx and pain) Volitional Swallow: Able to elicit    Oral/Motor/Sensory Function Overall Oral Motor/Sensory Function: Appears within functional limits for tasks assessed   Ice Chips Ice chips: Not tested   Thin Liquid Thin Liquid: Within functional limits Presentation: Straw Other Comments: minimal delay in swallow - suspect oral due to cognitive deficit -but no indications of airway compromise or residuals    Nectar Thick Nectar Thick Liquid: Not tested   Honey Thick Honey Thick Liquid: Not tested   Puree Puree: Not tested   Solid   GO    Solid: Not tested       Claudie Fisherman, [facility] SLP [phone]

## 2015-05-18 NOTE — Progress Notes (Signed)
Hypoglycemic Event  CBG: 69 @1640   Treatment: 15 GM carbohydrate snack  Symptoms: None  Follow-up CBG: 67 Time:1705     Another 15 GM carbohydrate snack given  Follow up CBG:  78      Times:1735  Possible Reasons for Event: Unknown  Comments/MD notified: Dr. Harlow Asa notified /no new orders received  Pt asymptomatic  Elizabeth Peterson  Remember to initiate Hypoglycemia Order Set & complete

## 2015-05-18 NOTE — Clinical Documentation Improvement (Signed)
Trauma  Can the diagnosis of anemia be further specified?   Acute on Chronic Blood Loss Anemia  Acute Blood Loss Anemia  Nutritional anemia, including the nutrition or mineral deficits  Chronic Anemia, including the suspected or known cause  Anemia of chronic disease, including the associated chronic disease state  Other  Clinically Undetermined   Supporting Information: Acute on chronic anemia, s/p 1 unit PRBC per 8/25 progress notes.  H/H: 8/24:   7.0/21.2. 8/21:   8.6/26.9. 8/19:  10.2/32.2.   Please exercise your independent, professional judgment when responding. A specific answer is not anticipated or expected.   Thank You,  [place]

## 2015-05-18 NOTE — Progress Notes (Signed)
Patient ID: Elizabeth Peterson, female   DOB: 01/17/1919, 81 y.o.   MRN: [id number]     [facility]      [address] [place], [place] [address]    Phone: [phone] FAX: [phone]     Subjective: Having BMs.  Tolerating liquids.  Afebrile.  WBC up.  Mg and K low.  Getting IV lasix.  Denies sob.  Pt states she feels old.    Objective:  Vital signs:  Filed Vitals:   05/17/15 2137 05/18/15 0136 05/18/15 0546 05/18/15 0747  BP: 109/63 126/56 114/64   Pulse: 93 94 99   Temp: 98.2 F (36.8 C) 99.9 F (37.7 C) 99.5 F (37.5 C)   TempSrc: Oral Oral Oral   Resp: $Remo'19 18 20   'owBku$ Height:      Weight:      SpO2: 100% 100% 100% 99%    Last BM Date: 05/11/15  Intake/Output   Yesterday:  08/24 0701 - 08/25 0700 In: 586 [P.O.:240; Blood:346] Out: 3000 [Urine:3000] This shift: I/O last 3 completed shifts: In: 826 [P.O.:480; Blood:346] Out: 5050 [Urine:5050]    Physical Exam: General: Pt awake/alert/oriented  To person and place.  Chest: cta.  No chest wall pain w good excursion CV:  Pulses intact.  Regular rhythm Abdomen: Soft.  Nondistended.  Mildly tender at incisions only.  Midline incision with staples, no erythema, lap sites with dermabond, no erythema.  No evidence of peritonitis.  No incarcerated hernias. Ext:  SCDs BLE.  No mjr edema.  No cyanosis Skin: No petechiae / purpura   Problem List:   Active Problems:   Anemia   Essential hypertension   KNEE PAIN, BILATERAL   Alzheimer's disease   Gastric ulcer with perforation   Gastric ulcer with perf and open repair   Pleural effusion   Asthmatic bronchitis   Palliative care encounter   Dyspnea   Wheezing    Results:   Labs: Results for orders placed or performed during the hospital encounter of 05/11/15 (from the past 48 hour(s))  Glucose, capillary     Status: None   Collection Time: 05/16/15 12:00 PM  Result Value Ref Range   Glucose-Capillary 71 65 - 99  mg/dL  Glucose, capillary     Status: None   Collection Time: 05/16/15  5:02 PM  Result Value Ref Range   Glucose-Capillary 66 65 - 99 mg/dL   Comment 1 Notify RN    Comment 2 Document in Chart   Glucose, capillary     Status: None   Collection Time: 05/16/15  5:47 PM  Result Value Ref Range   Glucose-Capillary 76 65 - 99 mg/dL  Glucose, capillary     Status: None   Collection Time: 05/16/15  9:32 PM  Result Value Ref Range   Glucose-Capillary 91 65 - 99 mg/dL  Glucose, capillary     Status: None   Collection Time: 05/16/15 11:34 PM  Result Value Ref Range   Glucose-Capillary 92 65 - 99 mg/dL  Glucose, capillary     Status: None   Collection Time: 05/17/15  3:55 AM  Result Value Ref Range   Glucose-Capillary 85 65 - 99 mg/dL  Basic metabolic panel     Status: Abnormal   Collection Time: 05/17/15  5:13 AM  Result Value Ref Range   Sodium 145 135 - 145 mmol/L   Potassium 3.1 (L) 3.5 - 5.1 mmol/L   Chloride 119 (H) 101 - 111 mmol/L  CO2 18 (L) 22 - 32 mmol/L   Glucose, Bld 83 65 - 99 mg/dL   BUN 13 6 - 20 mg/dL   Creatinine, Ser 1.02 (H) 0.44 - 1.00 mg/dL   Calcium 7.3 (L) 8.9 - 10.3 mg/dL   GFR calc non Af Amer 46 (L) >60 mL/min   GFR calc Af Amer 53 (L) >60 mL/min    Comment: (NOTE) The eGFR has been calculated using the CKD EPI equation. This calculation has not been validated in all clinical situations. eGFR's persistently <60 mL/min signify possible Chronic Kidney Disease.    Anion gap 8 5 - 15  CBC     Status: Abnormal   Collection Time: 05/17/15  5:13 AM  Result Value Ref Range   WBC 12.8 (H) 4.0 - 10.5 K/uL   RBC 2.31 (L) 3.87 - 5.11 MIL/uL   Hemoglobin 7.0 (L) 12.0 - 15.0 g/dL   HCT 21.2 (L) 36.0 - 46.0 %   MCV 91.8 78.0 - 100.0 fL   MCH 30.3 26.0 - 34.0 pg   MCHC 33.0 30.0 - 36.0 g/dL   RDW 15.3 11.5 - 15.5 %   Platelets 268 150 - 400 K/uL  Magnesium     Status: Abnormal   Collection Time: 05/17/15  5:13 AM  Result Value Ref Range   Magnesium 1.3 (L)  1.7 - 2.4 mg/dL  Glucose, capillary     Status: None   Collection Time: 05/17/15  7:52 AM  Result Value Ref Range   Glucose-Capillary 84 65 - 99 mg/dL  Prepare RBC     Status: None   Collection Time: 05/17/15  8:51 AM  Result Value Ref Range   Order Confirmation ORDER PROCESSED BY BLOOD BANK   Glucose, capillary     Status: None   Collection Time: 05/17/15 12:17 PM  Result Value Ref Range   Glucose-Capillary 71 65 - 99 mg/dL  Glucose, capillary     Status: None   Collection Time: 05/17/15  4:47 PM  Result Value Ref Range   Glucose-Capillary 77 65 - 99 mg/dL  Glucose, capillary     Status: Abnormal   Collection Time: 05/17/15  8:00 PM  Result Value Ref Range   Glucose-Capillary 113 (H) 65 - 99 mg/dL  Glucose, capillary     Status: None   Collection Time: 05/18/15 12:04 AM  Result Value Ref Range   Glucose-Capillary 82 65 - 99 mg/dL  Glucose, capillary     Status: Abnormal   Collection Time: 05/18/15  3:58 AM  Result Value Ref Range   Glucose-Capillary 64 (L) 65 - 99 mg/dL  Basic metabolic panel     Status: Abnormal   Collection Time: 05/18/15  6:05 AM  Result Value Ref Range   Sodium 141 135 - 145 mmol/L   Potassium 3.0 (L) 3.5 - 5.1 mmol/L   Chloride 115 (H) 101 - 111 mmol/L   CO2 21 (L) 22 - 32 mmol/L   Glucose, Bld 88 65 - 99 mg/dL   BUN 11 6 - 20 mg/dL   Creatinine, Ser 1.04 (H) 0.44 - 1.00 mg/dL   Calcium 7.1 (L) 8.9 - 10.3 mg/dL   GFR calc non Af Amer 45 (L) >60 mL/min   GFR calc Af Amer 52 (L) >60 mL/min    Comment: (NOTE) The eGFR has been calculated using the CKD EPI equation. This calculation has not been validated in all clinical situations. eGFR's persistently <60 mL/min signify possible Chronic Kidney Disease.    Anion  gap 5 5 - 15  CBC     Status: Abnormal   Collection Time: 05/18/15  6:05 AM  Result Value Ref Range   WBC 14.9 (H) 4.0 - 10.5 K/uL   RBC 2.79 (L) 3.87 - 5.11 MIL/uL   Hemoglobin 8.6 (L) 12.0 - 15.0 g/dL   HCT 25.1 (L) 36.0 - 46.0 %    MCV 90.0 78.0 - 100.0 fL   MCH 30.8 26.0 - 34.0 pg   MCHC 34.3 30.0 - 36.0 g/dL   RDW 14.9 11.5 - 15.5 %   Platelets 278 150 - 400 K/uL  Magnesium     Status: Abnormal   Collection Time: 05/18/15  6:05 AM  Result Value Ref Range   Magnesium 1.6 (L) 1.7 - 2.4 mg/dL  Glucose, capillary     Status: None   Collection Time: 05/18/15  7:41 AM  Result Value Ref Range   Glucose-Capillary 80 65 - 99 mg/dL    Imaging / Studies: Dg Ugi W/water Sol Cm  05/16/2015   CLINICAL DATA:  Patient status post repair per stroke perforated gastric ulcer with omental patch.  EXAM: WATER SOLUBLE UPPER GI SERIES  TECHNIQUE: Single-column upper GI series was performed using water soluble contrast.  CONTRAST:  37mL OMNIPAQUE IOHEXOL 300 MG/ML  SOLN  COMPARISON:  None.  FLUOROSCOPY TIME:  Radiation Exposure Index (as provided by the fluoroscopic device):  If the device does not provide the exposure index:  Fluoroscopy Time (in minutes and seconds):  1 minutes 52 seconds  Number of Acquired Images:  8  FINDINGS: Exam is suboptimal due to limited intake of the oral contrast as well as limited patient mobility. The contrast does flow easily into the stomach and ultimately passes into the first and second second portion of the duodenum. Stomach is gas-filled. No evidence of leak of the oral contrast.  IMPRESSION: No evidence of leak of oral contrast from the stomach on limited exam.   Electronically Signed   By: Suzy Bouchard M.D.   On: 05/16/2015 15:04    Medications / Allergies:  Scheduled Meds: . albuterol  2.5 mg Nebulization Q12H  . antiseptic oral rinse  7 mL Mouth Rinse q12n4p  . chlorhexidine  15 mL Mouth Rinse BID  . ciprofloxacin  400 mg Intravenous Q24H  . furosemide  40 mg Intravenous Daily  . heparin  5,000 Units Subcutaneous 3 times per day  . magnesium sulfate LVP 250-500 ml  3 g Intravenous Once  . metronidazole  500 mg Intravenous Q8H  . pantoprazole (PROTONIX) IV  80 mg Intravenous Q12H  .  potassium chloride  20 mEq Oral BID WC   Continuous Infusions:  PRN Meds:.oxyCODONE **AND** acetaminophen, albuterol, fentaNYL (SUBLIMAZE) injection, iohexol, ondansetron **OR** ondansetron (ZOFRAN) IV, sodium chloride  Antibiotics: Anti-infectives    Start     Dose/Rate Route Frequency Ordered Stop   05/13/15 0600  ciprofloxacin (CIPRO) IVPB 400 mg     400 mg 200 mL/hr over 60 Minutes Intravenous Every 24 hours 05/12/15 0757 05/20/15 0559   05/12/15 0800  metroNIDAZOLE (FLAGYL) IVPB 500 mg     500 mg 100 mL/hr over 60 Minutes Intravenous Every 8 hours 05/12/15 0725 05/19/15 0759   05/12/15 0730  ciprofloxacin (CIPRO) IVPB 400 mg  Status:  Discontinued     400 mg 200 mL/hr over 60 Minutes Intravenous Every 12 hours 05/12/15 0725 05/12/15 0756   05/12/15 0600  ciprofloxacin (CIPRO) IVPB 400 mg     400 mg 200 mL/hr  over 60 Minutes Intravenous Every 24 hours 05/11/15 1118 05/12/15 0648   05/11/15 1100  metroNIDAZOLE (FLAGYL) IVPB 500 mg     500 mg 100 mL/hr over 60 Minutes Intravenous Every 8 hours 05/11/15 1036 05/11/15 1227   05/11/15 1045  ciprofloxacin (CIPRO) IVPB 400 mg  Status:  Discontinued     400 mg 200 mL/hr over 60 Minutes Intravenous Every 12 hours 05/11/15 1036 05/11/15 1117   05/11/15 0515  ciprofloxacin (CIPRO) IVPB 400 mg     400 mg 200 mL/hr over 60 Minutes Intravenous  Once 05/11/15 0503 05/11/15 0656   05/11/15 0515  metroNIDAZOLE (FLAGYL) IVPB 500 mg     500 mg 100 mL/hr over 60 Minutes Intravenous  Once 05/11/15 0503 05/11/15 6151        Assessment/Plan POD#7 diagnostic laparoscopy converted to open repair gastric ulcer and omental patch---Dr. Joyice Faster -UGI negative for a leak.  May advance diet, but concerns for aspiration.  SLP to eval. -minimize narcotics, change to home tramadol -continue PPI, ?when to change to $RemoveB'40mg'eIjhrTWR$  BID ID-cipro/flagyl D#6/7.  H pylori is pending.  WBC up.   Acute on chronic anemia -s/p 1u PRBCs.  B12, folate normal Acute on chronic  kidney disease, stage III-stable.  Adjust tramadol per pharm Dyspnea and pulmonary edema-diuresing.  Thoracentesis cancelled as effusion has nearly resolved.  Appreciate IM/pulm management  HTN-stable.  Home meds on hold VTE prophylaxis-SCD/heparin FEN-advance to fulls.  Await SLP eval before advancing.  K and Mg supplemented  Dementia/AMS-resume remeron.   Dispo-CT today.  SNF when medically stable   Erby Pian, [facility] Pager [phone])   05/18/2015 8:54 AM

## 2015-05-19 ENCOUNTER — Inpatient Hospital Stay (HOSPITAL_COMMUNITY): Payer: Medicare Other

## 2015-05-19 DIAGNOSIS — K913 Postprocedural intestinal obstruction: Secondary | ICD-10-CM

## 2015-05-19 DIAGNOSIS — Z993 Dependence on wheelchair: Secondary | ICD-10-CM

## 2015-05-19 DIAGNOSIS — F0280 Dementia in other diseases classified elsewhere without behavioral disturbance: Secondary | ICD-10-CM

## 2015-05-19 DIAGNOSIS — Z9889 Other specified postprocedural states: Secondary | ICD-10-CM

## 2015-05-19 DIAGNOSIS — D72829 Elevated white blood cell count, unspecified: Secondary | ICD-10-CM

## 2015-05-19 LAB — URINALYSIS, ROUTINE W REFLEX MICROSCOPIC
BILIRUBIN URINE: NEGATIVE
GLUCOSE, UA: NEGATIVE mg/dL
Hgb urine dipstick: NEGATIVE
KETONES UR: NEGATIVE mg/dL
Nitrite: NEGATIVE
PH: 5.5 (ref 5.0–8.0)
PROTEIN: NEGATIVE mg/dL
Specific Gravity, Urine: 1.016 (ref 1.005–1.030)
Urobilinogen, UA: 1 mg/dL (ref 0.0–1.0)

## 2015-05-19 LAB — PROTEIN, BODY FLUID

## 2015-05-19 LAB — PROTIME-INR
INR: 1.48 (ref 0.00–1.49)
Prothrombin Time: 18 seconds — ABNORMAL HIGH (ref 11.6–15.2)

## 2015-05-19 LAB — BASIC METABOLIC PANEL
ANION GAP: 5 (ref 5–15)
BUN: 11 mg/dL (ref 6–20)
CALCIUM: 7.6 mg/dL — AB (ref 8.9–10.3)
CO2: 23 mmol/L (ref 22–32)
CREATININE: 0.86 mg/dL (ref 0.44–1.00)
Chloride: 112 mmol/L — ABNORMAL HIGH (ref 101–111)
GFR calc Af Amer: >60 mL/min (ref >60)
GFR, EST NON AFRICAN AMERICAN: 56 mL/min — AB (ref >60)
GLUCOSE: 94 mg/dL (ref 65–99)
Potassium: 4.2 mmol/L (ref 3.5–5.1)
Sodium: 140 mmol/L (ref 135–145)

## 2015-05-19 LAB — GLUCOSE, CAPILLARY
GLUCOSE-CAPILLARY: 119 mg/dL — AB (ref 65–99)
GLUCOSE-CAPILLARY: 89 mg/dL (ref 65–99)
GLUCOSE-CAPILLARY: 98 mg/dL (ref 65–99)
Glucose-Capillary: 102 mg/dL — ABNORMAL HIGH (ref 65–99)
Glucose-Capillary: 116 mg/dL — ABNORMAL HIGH (ref 65–99)
Glucose-Capillary: 134 mg/dL — ABNORMAL HIGH (ref 65–99)

## 2015-05-19 LAB — BODY FLUID CELL COUNT WITH DIFFERENTIAL
Eos, Fluid: 0 %
LYMPHS FL: 32 %
Monocyte-Macrophage-Serous Fluid: 26 % — ABNORMAL LOW (ref 50–90)
Neutrophil Count, Fluid: 42 % — ABNORMAL HIGH (ref 0–25)
Total Nucleated Cell Count, Fluid: 250 cu mm (ref 0–1000)

## 2015-05-19 LAB — URINE MICROSCOPIC-ADD ON

## 2015-05-19 LAB — CBC
HCT: 27.6 % — ABNORMAL LOW (ref 36.0–46.0)
HEMOGLOBIN: 9.5 g/dL — AB (ref 12.0–15.0)
MCH: 30.8 pg (ref 26.0–34.0)
MCHC: 34.4 g/dL (ref 30.0–36.0)
MCV: 89.6 fL (ref 78.0–100.0)
PLATELETS: 347 10*3/uL (ref 150–400)
RBC: 3.08 MIL/uL — ABNORMAL LOW (ref 3.87–5.11)
RDW: 14.9 % (ref 11.5–15.5)
WBC: 18.2 10*3/uL — ABNORMAL HIGH (ref 4.0–10.5)

## 2015-05-19 LAB — LACTATE DEHYDROGENASE: LDH: 327 U/L — ABNORMAL HIGH (ref 98–192)

## 2015-05-19 LAB — PROTEIN, TOTAL: Total Protein: 5.4 g/dL — ABNORMAL LOW (ref 6.5–8.1)

## 2015-05-19 LAB — LACTATE DEHYDROGENASE, PLEURAL OR PERITONEAL FLUID: LD, Fluid: 128 U/L — ABNORMAL HIGH (ref 3–23)

## 2015-05-19 MED ORDER — INFLUENZA VAC SPLIT QUAD 0.5 ML IM SUSY
0.5000 mL | PREFILLED_SYRINGE | INTRAMUSCULAR | Status: AC
  Administered 2015-05-21: 0.5 mL via INTRAMUSCULAR
  Filled 2015-05-19 (×3): qty 0.5

## 2015-05-19 MED ORDER — VANCOMYCIN HCL IN DEXTROSE 1-5 GM/200ML-% IV SOLN
1000.0000 mg | Freq: Once | INTRAVENOUS | Status: AC
  Administered 2015-05-19: 1000 mg via INTRAVENOUS
  Filled 2015-05-19: qty 200

## 2015-05-19 MED ORDER — DEXTROSE 5 % IV SOLN
1.0000 g | Freq: Three times a day (TID) | INTRAVENOUS | Status: DC
  Administered 2015-05-19 – 2015-05-21 (×7): 1 g via INTRAVENOUS
  Filled 2015-05-19 (×8): qty 1

## 2015-05-19 MED ORDER — VANCOMYCIN HCL 500 MG IV SOLR
500.0000 mg | Freq: Two times a day (BID) | INTRAVENOUS | Status: DC
  Administered 2015-05-19 – 2015-05-21 (×4): 500 mg via INTRAVENOUS
  Filled 2015-05-19 (×4): qty 500

## 2015-05-19 NOTE — Consult Note (Addendum)
[facility]  Total days of antibiotics 10        Day 1 vanco        Day 1 aztreo        (9 d of cipro/metro)       Reason for Consult: leukocytosis s/p POD#8 open  Gastric ulcer repair   Referring Physician: CCS  Active Problems:   Anemia   Essential hypertension   KNEE PAIN, BILATERAL   Alzheimer's disease   Gastric ulcer with perf and open repair   Pleural effusion   Asthmatic bronchitis   Palliative care encounter   Dyspnea   Wheezing    HPI: Elizabeth Peterson is a 81 y.o. female with hypertension, hyperlipidemia, dementia, GERD, wheelchair-bound due to osteoarthritis a had originally presented on 8/18 to the ED with moderate generalized abdominal pain for several weeks which worsened her day before the admission. seh reports taking nsaids for peripheral neuropathy. She was found to have significant tenderness with rebound and guarding and imaging revealed GI perforation.She was taken to OR found to have perforated gastric ulcer. She started as lap converted to open repair of the gastric ulcer and an omental patch. She was placed on cipro and metronidazole but not having much improvement. Post opcomplicated by ongoing hypotension, acute kidney injury, worsening mental status superimposed on dementia. She was found to have moderate pleural effusion. She underwent right sided 461m clear thoracentesis that was thought to be transudative process. Fluids sent for cell count and culture. Since 8/24, her fever curve has been trending out but not having temp >100.3. In addition, still having elevated WBC. Her antibiotics have been changed to vanco and aztreonam today. Repeat abd ct did not show evidence of intra-abdominal fluid collection.  She is surrounded by her family this afternoon who i have spoken to regarding her overall health. The patient is sleepy and less interactive today since having her thoracentesis procedure  Past Medical History  Diagnosis Date    . HYPERLIPIDEMIA 04/16/2007  . ANEMIA-NOS 04/16/2007  . ANXIETY 04/16/2007  . DEPRESSION 09/03/2007  . HYPERTENSION 04/16/2007  . ATHEROSLERO NATV ART EXTREM W/INTERMIT CLAUDICAT 06/05/2010  . ALLERGIC RHINITIS 02/27/2009  . PNEUMONIA, COMMUNITY ACQUIRED, PNEUMOCOCCAL 12/07/2008  . ASTHMA 09/03/2007  . GERD 04/16/2007  . DIVERTICULOSIS, COLON 09/03/2007  . MENOPAUSAL DISORDER 04/18/2008  . OSTEOARTHRITIS, KNEE, LEFT 11/24/2009  . JOINT EFFUSION, LEFT KNEE 02/27/2009  . KNEE PAIN, BILATERAL 06/01/2010  . LOW BACK PAIN 04/16/2007  . LEG PAIN, LEFT 06/01/2010  . WEIGHT LOSS 07/25/2008  . RASH-NONVESICULAR 07/25/2008  . Abdominal pain, generalized 09/03/2007  . BREAST CANCER, HX OF 04/16/2007  . COLONIC POLYPS, HX OF 09/03/2007  . Dementia 06/17/2012    mild  . Peripheral neuropathy 06/17/2012    Allergies:  Allergies  Allergen Reactions  . Amlodipine Besylate     REACTION: rash and itch  . Benzodiazepines Other (See Comments)    Hx of overuse/memory issue  . Penicillins   . Sulfonamide Derivatives      MEDICATIONS: . albuterol  2.5 mg Nebulization Q12H  . antiseptic oral rinse  7 mL Mouth Rinse q12n4p  . aztreonam  1 g Intravenous Q8H  . chlorhexidine  15 mL Mouth Rinse BID  . furosemide  40 mg Intravenous Daily  . heparin  5,000 Units Subcutaneous 3 times per day  . [START ON 05/20/2015] Influenza vac split quadrivalent PF  0.5 mL Intramuscular Tomorrow-1000  . mirtazapine  15 mg Oral QHS  .  pantoprazole (PROTONIX) IV  80 mg Intravenous Q12H  . potassium chloride  20 mEq Oral BID WC  . vancomycin  500 mg Intravenous Q12H    Social History  Substance Use Topics  . Smoking status: Never Smoker   . Smokeless tobacco: Never Used  . Alcohol Use: No    Family History  Problem Relation Age of Onset  . Cancer Sister     Breast  . Hypertension Other   . Diabetes Other     1st degree relative  . Hypertension Maternal Grandmother     Review of Systems - No fever, having lower  extremitiy pain and abdominal pain. Otherwise 10 point ros is negative  OBJECTIVE: Temp:  [99 F (37.2 C)-99.8 F (37.7 C)] 99 F (37.2 C) (08/26 1439) Pulse Rate:  [79-108] 98 (08/26 1439) Resp:  [18] 18 (08/26 1439) BP: (125-134)/(67-86) 130/68 mmHg (08/26 1439) SpO2:  [95 %-100 %] 95 % (08/26 1439) Physical Exam  Constitutional:  oriented to person, place, and time. appears well-developed and well-nourished. No distress.  HENT: [place]/AT, PERRLA, no scleral icterus Mouth/Throat: Oropharynx is clear and moist. No oropharyngeal exudate. edentulous Cardiovascular: Normal rate, regular rhythm and normal heart sounds. Exam reveals no gallop and no friction rub.  No murmur heard.  Pulmonary/Chest: Effort normal and breath sounds normal. No respiratory distress.  has no wheezes.  Neck = supple, no nuchal rigidity Abdominal: Soft. Bowel sounds are decreased. Mildly distended abdomen, with midline bandaged Lymphadenopathy: no cervical adenopathy. No axillary adenopathy Neurological: alert and oriented to person, place, and time.  Skin: Skin is warm and dry. No rash noted. No erythema.  Ext: +1 edema from anasarca bilaterally lower extremities Psychiatric: a normal mood and affect are flat  LABS: Results for orders placed or performed during the hospital encounter of 05/11/15 (from the past 48 hour(s))  Glucose, capillary     Status: None   Collection Time: 05/17/15  4:47 PM  Result Value Ref Range   Glucose-Capillary 77 65 - 99 mg/dL  Glucose, capillary     Status: Abnormal   Collection Time: 05/17/15  8:00 PM  Result Value Ref Range   Glucose-Capillary 113 (H) 65 - 99 mg/dL  Glucose, capillary     Status: None   Collection Time: 05/18/15 12:04 AM  Result Value Ref Range   Glucose-Capillary 82 65 - 99 mg/dL  Glucose, capillary     Status: Abnormal   Collection Time: 05/18/15  3:58 AM  Result Value Ref Range   Glucose-Capillary 64 (L) 65 - 99 mg/dL  Basic metabolic panel     Status:  Abnormal   Collection Time: 05/18/15  6:05 AM  Result Value Ref Range   Sodium 141 135 - 145 mmol/L   Potassium 3.0 (L) 3.5 - 5.1 mmol/L   Chloride 115 (H) 101 - 111 mmol/L   CO2 21 (L) 22 - 32 mmol/L   Glucose, Bld 88 65 - 99 mg/dL   BUN 11 6 - 20 mg/dL   Creatinine, Ser 1.04 (H) 0.44 - 1.00 mg/dL   Calcium 7.1 (L) 8.9 - 10.3 mg/dL   GFR calc non Af Amer 45 (L) >60 mL/min   GFR calc Af Amer 52 (L) >60 mL/min    Comment: (NOTE) The eGFR has been calculated using the CKD EPI equation. This calculation has not been validated in all clinical situations. eGFR's persistently <60 mL/min signify possible Chronic Kidney Disease.    Anion gap 5 5 - 15  CBC  Status: Abnormal   Collection Time: 05/18/15  6:05 AM  Result Value Ref Range   WBC 14.9 (H) 4.0 - 10.5 K/uL   RBC 2.79 (L) 3.87 - 5.11 MIL/uL   Hemoglobin 8.6 (L) 12.0 - 15.0 g/dL   HCT 25.1 (L) 36.0 - 46.0 %   MCV 90.0 78.0 - 100.0 fL   MCH 30.8 26.0 - 34.0 pg   MCHC 34.3 30.0 - 36.0 g/dL   RDW 14.9 11.5 - 15.5 %   Platelets 278 150 - 400 K/uL  Magnesium     Status: Abnormal   Collection Time: 05/18/15  6:05 AM  Result Value Ref Range   Magnesium 1.6 (L) 1.7 - 2.4 mg/dL  Glucose, capillary     Status: None   Collection Time: 05/18/15  7:41 AM  Result Value Ref Range   Glucose-Capillary 80 65 - 99 mg/dL  Glucose, capillary     Status: None   Collection Time: 05/18/15 11:41 AM  Result Value Ref Range   Glucose-Capillary 75 65 - 99 mg/dL  Glucose, capillary     Status: None   Collection Time: 05/18/15  4:30 PM  Result Value Ref Range   Glucose-Capillary 69 65 - 99 mg/dL  Glucose, capillary     Status: None   Collection Time: 05/18/15  4:45 PM  Result Value Ref Range   Glucose-Capillary 66 65 - 99 mg/dL  Glucose, capillary     Status: None   Collection Time: 05/18/15  5:08 PM  Result Value Ref Range   Glucose-Capillary 67 65 - 99 mg/dL  Glucose, capillary     Status: None   Collection Time: 05/18/15  5:38 PM    Result Value Ref Range   Glucose-Capillary 78 65 - 99 mg/dL  Glucose, capillary     Status: Abnormal   Collection Time: 05/18/15  8:06 PM  Result Value Ref Range   Glucose-Capillary 105 (H) 65 - 99 mg/dL  Glucose, capillary     Status: Abnormal   Collection Time: 05/19/15 12:00 AM  Result Value Ref Range   Glucose-Capillary 119 (H) 65 - 99 mg/dL  Urinalysis, Routine w reflex microscopic (not at [facility])     Status: Abnormal   Collection Time: 05/19/15 12:08 AM  Result Value Ref Range   Color, Urine AMBER (A) YELLOW    Comment: BIOCHEMICALS MAY BE AFFECTED BY COLOR   APPearance CLEAR CLEAR   Specific Gravity, Urine 1.016 1.005 - 1.030   pH 5.5 5.0 - 8.0   Glucose, UA NEGATIVE NEGATIVE mg/dL   Hgb urine dipstick NEGATIVE NEGATIVE   Bilirubin Urine NEGATIVE NEGATIVE   Ketones, ur NEGATIVE NEGATIVE mg/dL   Protein, ur NEGATIVE NEGATIVE mg/dL   Urobilinogen, UA 1.0 0.0 - 1.0 mg/dL   Nitrite NEGATIVE NEGATIVE   Leukocytes, UA TRACE (A) NEGATIVE  Urine microscopic-add on     Status: Abnormal   Collection Time: 05/19/15 12:08 AM  Result Value Ref Range   WBC, UA 3-6 <3 WBC/hpf   Bacteria, UA FEW (A) RARE   Casts HYALINE CASTS (A) NEGATIVE   Urine-Other FEW YEAST     Comment: MUCOUS PRESENT  Lactate dehydrogenase     Status: Abnormal   Collection Time: 05/19/15  5:00 AM  Result Value Ref Range   LDH 327 (H) 98 - 192 U/L  Protein, total     Status: Abnormal   Collection Time: 05/19/15  5:00 AM  Result Value Ref Range   Total Protein 5.4 (L) 6.5 - 8.1  g/dL  Basic metabolic panel     Status: Abnormal   Collection Time: 05/19/15  5:02 AM  Result Value Ref Range   Sodium 140 135 - 145 mmol/L   Potassium 4.2 3.5 - 5.1 mmol/L    Comment: DELTA CHECK NOTED REPEATED TO VERIFY NO VISIBLE HEMOLYSIS    Chloride 112 (H) 101 - 111 mmol/L   CO2 23 22 - 32 mmol/L   Glucose, Bld 94 65 - 99 mg/dL   BUN 11 6 - 20 mg/dL   Creatinine, Ser 0.86 0.44 - 1.00 mg/dL   Calcium 7.6 (L) 8.9 - 10.3  mg/dL   GFR calc non Af Amer 56 (L) >60 mL/min   GFR calc Af Amer >60 >60 mL/min    Comment: (NOTE) The eGFR has been calculated using the CKD EPI equation. This calculation has not been validated in all clinical situations. eGFR's persistently <60 mL/min signify possible Chronic Kidney Disease.    Anion gap 5 5 - 15  CBC     Status: Abnormal   Collection Time: 05/19/15  5:02 AM  Result Value Ref Range   WBC 18.2 (H) 4.0 - 10.5 K/uL   RBC 3.08 (L) 3.87 - 5.11 MIL/uL   Hemoglobin 9.5 (L) 12.0 - 15.0 g/dL   HCT 27.6 (L) 36.0 - 46.0 %   MCV 89.6 78.0 - 100.0 fL   MCH 30.8 26.0 - 34.0 pg   MCHC 34.4 30.0 - 36.0 g/dL   RDW 14.9 11.5 - 15.5 %   Platelets 347 150 - 400 K/uL  Glucose, capillary     Status: Abnormal   Collection Time: 05/19/15  7:55 AM  Result Value Ref Range   Glucose-Capillary 102 (H) 65 - 99 mg/dL  Protime-INR     Status: Abnormal   Collection Time: 05/19/15  8:20 AM  Result Value Ref Range   Prothrombin Time 18.0 (H) 11.6 - 15.2 seconds   INR 1.48 0.00 - 1.49  Glucose, capillary     Status: None   Collection Time: 05/19/15 11:54 AM  Result Value Ref Range   Glucose-Capillary 89 65 - 99 mg/dL  Body fluid cell count with differential     Status: Abnormal   Collection Time: 05/19/15 11:56 AM  Result Value Ref Range   Fluid Type-FCT PLEURAL     Comment: CORRECTED ON 08/26 AT 1219: PREVIOUSLY REPORTED AS Pleural R   Color, Fluid YELLOW (A) YELLOW   Appearance, Fluid CLEAR CLEAR   WBC, Fluid 250 0 - 1000 cu mm   Neutrophil Count, Fluid 42 (H) 0 - 25 %   Lymphs, Fluid 32 %   Monocyte-Macrophage-Serous Fluid 26 (L) 50 - 90 %   Eos, Fluid 0 %   Other Cells, Fluid OTHER CELLS IDENTIFIED AS MESOTHELIAL CELLS %    Comment: CORRELATE WITH CYTOLOGY.    MICRO: 8/22 blood cx ngtd 8/23 blood cx ngtd 8/26 urine cx pending 8/26 pleural fluid cx pending IMAGING: i reviewed imaging of CT and CXR, agree with findings, residual right sided effusion post paracentesis, CT  -> ileus in small bowel  Ct Abdomen Pelvis Wo Contrast  05/18/2015   CLINICAL DATA:  Leukocytosis. Laparoscopic gastric ulcer surgery 05/11/2015 with persistent abdominal pain.  EXAM: CT ABDOMEN AND PELVIS WITHOUT CONTRAST  TECHNIQUE: Multidetector CT imaging of the abdomen and pelvis was performed following the standard protocol without IV contrast.  COMPARISON:  05/11/2015  FINDINGS: Lung bases demonstrate worsening moderate size right effusion and small left effusion with associated  compressive atelectasis in the right base. Mild stable cardiomegaly with atherosclerotic coronary artery disease unchanged. Central venous line seen with tip over the cavoatrial junction.  Abdominal images demonstrate the stomach to be mildly distended with air and minimal wall thickening over the distal stomach/ proximal duodenum likely postsurgical. This mild wall thickening/ edema could be causing a degree of obstruction. No evidence of free peritoneal air. Small amount of perihepatic fluid. Minimal density just below the splenic flexure likely postsurgical.  There are a couple sub cm stable liver hypodensities likely cysts. Suggestion mild contrast excretion within the gallbladder. The spleen, pancreas and adrenal glands are within normal. Kidneys normal in size without nephrolithiasis. There is subtle symmetric prominence of the intrarenal collecting systems. Appendix not seen.  Contrast and air are present throughout the colon. There is mild diverticulosis over the sigmoid colon. There are several minimally dilated small bowel loops likely postoperative ileus.  Abdominal wall incision anteriorly in the midline. Bilateral subcutaneous edema over the abdominal wall and pelvic soft tissues.  Calcified plaque of the abdominal aorta and iliac arteries.  Pelvic images demonstrate a Foley catheter present within a decompressed bladder. There is mild fecal retention over the rectum. There is a small amount of free pelvic fluid  present.  There are degenerative changes of the spine with multilevel disc disease over the lumbar spine and subtle grade 1 anterolisthesis of L3 on L4 and L4 on L5 unchanged. Mild degenerate change of the hips.  IMPRESSION: Mild gaseous distention of the stomach with minimal wall thickening over the distal stomach/ proximal duodenum likely postsurgical edema and may be causing a mild degree of obstruction. Several mildly dilated small bowel loops likely postoperative ileus. Small amount of peritoneal fluid likely postsurgical. No free peritoneal air.  Worsening moderate size right effusion and small left effusion with associated compressive atelectasis in the right base.  Mild diverticulosis of the sigmoid colon.  Two small subcentimeter liver hypodensities unchanged likely cysts.  Stable cardiomegaly and atherosclerotic coronary artery disease.  Other stable chronic finding as described.   Electronically Signed   By: Marin Olp M.D.   On: 05/18/2015 14:25   Dg Chest 2 View  05/18/2015   CLINICAL DATA:  Leukocytosis. No chest complaints. History of hypertension, asthma, dementia.  EXAM: CHEST  2 VIEW  COMPARISON:  05/16/2015 and earlier  FINDINGS: Right-sided PICC line tip overlies the level of the right atrium. Right scratched of bilateral pleural effusions are present, right greater than left. Bilateral lung opacities obscure the hemidiaphragms bilaterally, right greater than left. Little change since prior study. Residual contrast identified in the colon. Cardiomegaly.  IMPRESSION: 1. Persistent bilateral infiltrates, right greater than left. 2. Bilateral pleural effusions.   Electronically Signed   By: Nolon Nations M.D.   On: 05/18/2015 13:47   Dg Chest Port 1 View  05/19/2015   CLINICAL DATA:  Status post thoracentesis right side. History of asthma, hypertension, pneumonia, left mastectomy.  EXAM: PORTABLE CHEST - 1 VIEW  COMPARISON:  05/18/2015  FINDINGS: Right-sided PICC line tip overlies the  level of the lower superior vena cava, unchanged. There continues be dense opacification at the right lung base obscuring the hemidiaphragm. Horizontal level of the pleural effusion raises the question of air within the pleural space although no definite pneumothorax can be identified. There is persistent opacity in the left lung base as well. EKG lead overlies the left lung apex. Residual contrast is seen the colon.  IMPRESSION: 1. Status post right pneumothorax scratched  a status post right thoracentesis. 2. Question of small right pneumothorax.  Follow-up is recommended. The salient findings were discussed with Noe Gens on 05/19/2015 at 1:41 pm.   Electronically Signed   By: Nolon Nations M.D.   On: 05/19/2015 13:41     Assessment/Plan:  81yo F with gastric perforation POD#8 s/p open repair complicated with post op ileus, leukocytosis, and pleural effusion s/p thoracentesis on 8/26. Light's criteria c/w transudative, though mesothelioma cells seen on differential  - presumed intra-abd infection from gastric perforation s/p repair = for now can continue on vancomycin and aztreonam until more information from recent cultures return, in hopes to narrow treatment - leukocytosis = continue to monitor, would check differential. If she starts to have diarrhea, would have low threshold to test for cdifficile - pleural effusion = appears consistent with trasudative effusion, though mesothelioma cells are found.  Would wait to see if cytology also confirms this. - will need to follow up on culture results to see if can narrow spectrum  Dr. Linus Salmons available for questions this weekend  Spent 45 min with patient, with greater than 50% of time counseling with family  [facility] [phone]

## 2015-05-19 NOTE — Progress Notes (Signed)
ANTIBIOTIC CONSULT NOTE - INITIAL  Pharmacy Consult for Vancomycin & Aztreonam Indication: pneumonia  Allergies  Allergen Reactions  . Amlodipine Besylate     REACTION: rash and itch  . Benzodiazepines Other (See Comments)    Hx of overuse/memory issue  . Penicillins   . Sulfonamide Derivatives    Patient Measurements: Height: 5\' 3"  (160 cm) Weight: 117 lb 8.1 oz (53.3 kg) IBW/kg (Calculated) : 52.4  Vital Signs: Temp: 99.8 F (37.7 C) (08/26 0515) Temp Source: Oral (08/26 0515) BP: 132/67 mmHg (08/26 0515) Pulse Rate: 106 (08/26 0515) Intake/Output from previous day: 08/25 0701 - 08/26 0700 In: 1886.8 [P.O.:1250; I.V.:536.8; IV Piggyback:100] Out: 2650 [Urine:2650] Intake/Output from this shift:    Labs:  Recent Labs  05/17/15 0513 05/18/15 0605 05/19/15 0502  WBC 12.8* 14.9* 18.2*  HGB 7.0* 8.6* 9.5*  PLT 268 278 347  CREATININE 1.02* 1.04* 0.86   Estimated Creatinine Clearance: 33.1 mL/min (by C-G formula based on Cr of 0.86). No results for input(s): VANCOTROUGH, VANCOPEAK, VANCORANDOM, GENTTROUGH, GENTPEAK, GENTRANDOM, TOBRATROUGH, TOBRAPEAK, TOBRARND, AMIKACINPEAK, AMIKACINTROU, AMIKACIN in the last 72 hours.   Microbiology: Recent Results (from the past 720 hour(s))  MRSA PCR Screening     Status: None   Collection Time: 05/11/15  9:55 AM  Result Value Ref Range Status   MRSA by PCR NEGATIVE NEGATIVE Final    Comment:        The GeneXpert MRSA Assay (FDA approved for NASAL specimens only), is one component of a comprehensive MRSA colonization surveillance program. It is not intended to diagnose MRSA infection nor to guide or monitor treatment for MRSA infections.   Culture, blood (routine x 2)     Status: None (Preliminary result)   Collection Time: 05/15/15 10:15 PM  Result Value Ref Range Status   Specimen Description BLOOD LEFT ANTECUBITAL  Final   Special Requests IN PEDIATRIC BOTTLE 1CC  Final   Culture   Final    NO GROWTH 3  DAYS Performed at [facility]    Report Status PENDING  Incomplete  Culture, blood (routine x 2)     Status: None (Preliminary result)   Collection Time: 05/16/15  7:05 AM  Result Value Ref Range Status   Specimen Description BLOOD RIGHT ARM  Final   Special Requests IN PEDIATRIC BOTTLE 3CC  Final   Culture   Final    NO GROWTH 2 DAYS Performed at [facility]    Report Status PENDING  Incomplete   Medical History: Past Medical History  Diagnosis Date  . HYPERLIPIDEMIA 04/16/2007  . ANEMIA-NOS 04/16/2007  . ANXIETY 04/16/2007  . DEPRESSION 09/03/2007  . HYPERTENSION 04/16/2007  . ATHEROSLERO NATV ART EXTREM W/INTERMIT CLAUDICAT 06/05/2010  . ALLERGIC RHINITIS 02/27/2009  . PNEUMONIA, COMMUNITY ACQUIRED, PNEUMOCOCCAL 12/07/2008  . ASTHMA 09/03/2007  . GERD 04/16/2007  . DIVERTICULOSIS, COLON 09/03/2007  . MENOPAUSAL DISORDER 04/18/2008  . OSTEOARTHRITIS, KNEE, LEFT 11/24/2009  . JOINT EFFUSION, LEFT KNEE 02/27/2009  . KNEE PAIN, BILATERAL 06/01/2010  . LOW BACK PAIN 04/16/2007  . LEG PAIN, LEFT 06/01/2010  . WEIGHT LOSS 07/25/2008  . RASH-NONVESICULAR 07/25/2008  . Abdominal pain, generalized 09/03/2007  . BREAST CANCER, HX OF 04/16/2007  . COLONIC POLYPS, HX OF 09/03/2007  . Dementia 06/17/2012    mild  . Peripheral neuropathy 06/17/2012   Medications:  Scheduled:  . albuterol  2.5 mg Nebulization Q12H  . antiseptic oral rinse  7 mL Mouth Rinse q12n4p  . chlorhexidine  15 mL Mouth  Rinse BID  . furosemide  40 mg Intravenous Daily  . heparin  5,000 Units Subcutaneous 3 times per day  . mirtazapine  15 mg Oral QHS  . pantoprazole (PROTONIX) IV  80 mg Intravenous Q12H  . potassium chloride  20 mEq Oral BID WC   Anti-infectives    Start     Dose/Rate Route Frequency Ordered Stop   05/13/15 0600  ciprofloxacin (CIPRO) IVPB 400 mg     400 mg 200 mL/hr over 60 Minutes Intravenous Every 24 hours 05/12/15 0757 05/19/15 0734   05/12/15 0800  metroNIDAZOLE (FLAGYL) IVPB 500 mg      500 mg 100 mL/hr over 60 Minutes Intravenous Every 8 hours 05/12/15 0725 05/19/15 0759   05/12/15 0730  ciprofloxacin (CIPRO) IVPB 400 mg  Status:  Discontinued     400 mg 200 mL/hr over 60 Minutes Intravenous Every 12 hours 05/12/15 0725 05/12/15 0756   05/12/15 0600  ciprofloxacin (CIPRO) IVPB 400 mg     400 mg 200 mL/hr over 60 Minutes Intravenous Every 24 hours 05/11/15 1118 05/12/15 0648   05/11/15 1100  metroNIDAZOLE (FLAGYL) IVPB 500 mg     500 mg 100 mL/hr over 60 Minutes Intravenous Every 8 hours 05/11/15 1036 05/11/15 1227   05/11/15 1045  ciprofloxacin (CIPRO) IVPB 400 mg  Status:  Discontinued     400 mg 200 mL/hr over 60 Minutes Intravenous Every 12 hours 05/11/15 1036 05/11/15 1117   05/11/15 0515  ciprofloxacin (CIPRO) IVPB 400 mg     400 mg 200 mL/hr over 60 Minutes Intravenous  Once 05/11/15 0503 05/11/15 0656   05/11/15 0515  metroNIDAZOLE (FLAGYL) IVPB 500 mg     500 mg 100 mL/hr over 60 Minutes Intravenous  Once 05/11/15 0503 05/11/15 0619     Assessment: 89 yoF to ED 8/18 with hx of several weeks abd pain, worsened night before admission. Perforated viscus on CT, diagnostic lap to open repair gastric ulcer with omental patch.  - CT 8/25: worsening moderate R effusion, small L effusion. No abd abscess, free air. Possible thoracentesis needed (8/24 lung U/S with small effusion, unable to drain).  - completed 7 days Cipro/Flagyl. Begin Vancomycin/Aztreonam for r/o HCAP  Goal of Therapy:  Vancomycin trough level 15-20 mcg/ml  Plan:   Aztreonam 1gm q8hr  Vancomycin 1gm x1, then 500mg  q12hr  Thank you,  Minda Ditto PharmD Pager [phone] 05/19/2015, 9:33 AM

## 2015-05-19 NOTE — Progress Notes (Signed)
PULMONARY / CRITICAL CARE MEDICINE   Name: Elizabeth Peterson MRN: [id number] DOB: 1918/09/27    ADMISSION DATE:  05/11/2015 CONSULTATION DATE: 8/18  REFERRING MD : CCS  CHIEF COMPLAINT:  Perfed gastric ulcer  INITIAL PRESENTATION: 81 y/o female with dementia.  Abd pain> perforated gastric ulcer.  She had an open repair on 8/18.    STUDIES:    SIGNIFICANT EVENTS: 8/18 p lap 8/26 R thoracentesis> 400cc clear fluid removed   HISTORY OF PRESENT ILLNESS:   Post exp lap   SUBJECTIVE: called back for fever, enlarged fluid collection per CT chest  VITAL SIGNS: Temp:  [99 F (37.2 C)-99.8 F (37.7 C)] 99.8 F (37.7 C) (08/26 0515) Pulse Rate:  [79-108] 106 (08/26 0515) Resp:  [18-19] 18 (08/26 0515) BP: (114-134)/(53-86) 132/67 mmHg (08/26 0515) SpO2:  [95 %-100 %] 95 % (08/26 0958) HEMODYNAMICS:     INTAKE / OUTPUT:  Intake/Output Summary (Last 24 hours) at 05/19/15 1154 Last data filed at 05/19/15 1022  Gross per 24 hour  Intake 1156.75 ml  Output   2525 ml  Net -1368.25 ml    PHYSICAL EXAMINATION: General:  Frail elderly female in no distress HENT: NCAT OP clear PULM: no wheezing, breathing comfortable, diminished R base CV: RRR, S1/S2 WNL GI: BS+, soft, nontender MSK: normal bulk and tone Neuro: Awake, confused but conversant  LABS:  CBC  Recent Labs Lab 05/17/15 0513 05/18/15 0605 05/19/15 0502  WBC 12.8* 14.9* 18.2*  HGB 7.0* 8.6* 9.5*  HCT 21.2* 25.1* 27.6*  PLT 268 278 347   Coag's  Recent Labs Lab 05/19/15 0820  INR 1.48   BMET  Recent Labs Lab 05/17/15 0513 05/18/15 0605 05/19/15 0502  NA 145 141 140  K 3.1* 3.0* 4.2  CL 119* 115* 112*  CO2 18* 21* 23  BUN 13 11 11   CREATININE 1.02* 1.04* 0.86  GLUCOSE 83 88 94   Electrolytes  Recent Labs Lab 05/13/15 0350  05/17/15 0513 05/18/15 0605 05/19/15 0502  CALCIUM 7.1*  < > 7.3* 7.1* 7.6*  MG 1.4*  --  1.3* 1.6*  --   PHOS 2.5  --   --   --   --   < > = values in this  interval not displayed. Sepsis Markers No results for input(s): LATICACIDVEN, PROCALCITON, O2SATVEN in the last 168 hours. ABG No results for input(s): PHART, PCO2ART, PO2ART in the last 168 hours. Liver Enzymes No results for input(s): AST, ALT, ALKPHOS, BILITOT, ALBUMIN in the last 168 hours. Cardiac Enzymes No results for input(s): TROPONINI, PROBNP in the last 168 hours. Glucose  Recent Labs Lab 05/18/15 1645 05/18/15 1708 05/18/15 1738 05/18/15 2006 05/19/15 05/19/15 0755  GLUCAP 66 67 78 105* 119* 102*    Imaging CXR from 8/21 reviwed> modearate R effusion CT abdomen 8/25 images personally reviewed> (lung images) enlarging R pleural effusion, atelectasis noted, no mass or infiltrate  ASSESSMENT / PLAN:  PULMONARY Moderate R sided pleural effusion > now s/p thoracentesis> fluid very clear; suspect this is transudate or sympathetic effusion, do not think it is infected Asthmatic bronchitis > improved with albuterol; agree ddx fluid overload but has history of asthma and now better with albuterol Likely acute pulmonary edema A: P:   Thoracentesis studies sent, will f/u results O2 as needed to maintain O2 sat > 92% Continue gentle diuresis, goal daily -500cc Continue to plan on albuterol bid and q4h prn for about a week, then prn, will need albuterol and a nebulizer machine at  home  FAMILY  - Updates: Updated Daughter at bedside - Inter-disciplinary family meet or Palliative Care meeting due by:  day 7   PCCM to sign off  Roselie Awkward, MD [place] [facility] Pager: [phone] Cell: [phone] After 3pm or if no response, call [phone]

## 2015-05-19 NOTE — Care Management Important Message (Signed)
Important Message  Patient Details  Name: Elizabeth Peterson MRN: [id number] Date of Birth: 26-Oct-1918   Medicare Important Message Given:  Yes-third notification given    Camillo Flaming 05/19/2015, 1:35 [place] Message  Patient Details  Name: Elizabeth Peterson MRN: [id number] Date of Birth: [birth year]   Medicare Important Message Given:  Yes-third notification given    Camillo Flaming 05/19/2015, 1:35 PM

## 2015-05-19 NOTE — Progress Notes (Signed)
OT Cancellation Note  Patient Details Name: Miel Wisener MRN: [id number] DOB: 17-Apr-1919   Cancelled Treatment:    Reason Eval/Treat Not Completed: Other (comment).  Pt is getting ready for thoracentesis. Will check back as schedule permits.  Selma Mink 05/19/2015, 11:16 AM  Lesle Chris, OTR/L [phone] 05/19/2015

## 2015-05-19 NOTE — Progress Notes (Signed)
Patient ID: Elizabeth Peterson, female   DOB: [birth year], 81 y.o.   MRN: [id number]     [facility]      [address] [place]   [place], [place] [address]    Phone: [phone] FAX: [phone]     Subjective: Diuresed 2650.  Still +9.5l.  Sob unchanged.  CT reviewed.  WBC continues to go up.  Temp max 99.8.  UA negative.   Objective:  Vital signs:  Filed Vitals:   05/18/15 2011 05/18/15 2157 05/19/15 0116 05/19/15 0515  BP:  125/70 134/86 132/67  Pulse:  79 108 106  Temp:  99 F (37.2 C) 99.6 F (37.6 C) 99.8 F (37.7 C)  TempSrc:  Oral Oral Oral  Resp:  18 18 18   Height:      Weight:      SpO2: 98% 99% 99% 100%    Last BM Date: 05/11/15  Intake/Output   Yesterday:  08/25 0701 - 08/26 0700 In: 1886.8 [P.O.:1250; I.V.:536.8; IV Piggyback:100] Out: 2650 [Urine:2650] This shift:    I/O last 3 completed shifts: In: 1886.8 [P.O.:1250; I.V.:536.8; IV Piggyback:100] Out: 3800 [Urine:3800]    Physical Exam: General: Pt awake/alert/oriented To person and place.  Chest: cta.pulling 2651 on IS.  CV: Pulses intact. Regular rhythm Abdomen: Soft. Nondistended. Mildly tender at incisions only.  Wound is open, clean, fascia is intact.   No evidence of peritonitis. No incarcerated hernias. Ext: SCDs BLE. No mjr edema. No cyanosis Skin: No petechiae / purpura   Problem List:   Active Problems:   Anemia   Essential hypertension   KNEE PAIN, BILATERAL   Alzheimer's disease   Gastric ulcer with perforation   Gastric ulcer with perf and open repair   Pleural effusion   Asthmatic bronchitis   Palliative care encounter   Dyspnea   Wheezing    Results:   Labs: Results for orders placed or performed during the hospital encounter of 05/11/15 (from the past 48 hour(s))  Glucose, capillary     Status: None   Collection Time: 05/17/15  7:52 AM  Result Value Ref Range   Glucose-Capillary 84 65 - 99 mg/dL  Prepare RBC      Status: None   Collection Time: 05/17/15  8:51 AM  Result Value Ref Range   Order Confirmation ORDER PROCESSED BY BLOOD BANK   Glucose, capillary     Status: None   Collection Time: 05/17/15 12:17 PM  Result Value Ref Range   Glucose-Capillary 71 65 - 99 mg/dL  Glucose, capillary     Status: None   Collection Time: 05/17/15  4:47 PM  Result Value Ref Range   Glucose-Capillary 77 65 - 99 mg/dL  Glucose, capillary     Status: Abnormal   Collection Time: 05/17/15  8:00 PM  Result Value Ref Range   Glucose-Capillary 113 (H) 65 - 99 mg/dL  Glucose, capillary     Status: None   Collection Time: 05/18/15 12:04 AM  Result Value Ref Range   Glucose-Capillary 82 65 - 99 mg/dL  Glucose, capillary     Status: Abnormal   Collection Time: 05/18/15  3:58 AM  Result Value Ref Range   Glucose-Capillary 64 (L) 65 - 99 mg/dL  Basic metabolic panel     Status: Abnormal   Collection Time: 05/18/15  6:05 AM  Result Value Ref Range   Sodium 141 135 - 145 mmol/L   Potassium 3.0 (L) 3.5 - 5.1 mmol/L   Chloride 115 (H) 101 -  111 mmol/L   CO2 21 (L) 22 - 32 mmol/L   Glucose, Bld 88 65 - 99 mg/dL   BUN 11 6 - 20 mg/dL   Creatinine, Ser 1.04 (H) 0.44 - 1.00 mg/dL   Calcium 7.1 (L) 8.9 - 10.3 mg/dL   GFR calc non Af Amer 45 (L) >60 mL/min   GFR calc Af Amer 52 (L) >60 mL/min    Comment: (NOTE) The eGFR has been calculated using the CKD EPI equation. This calculation has not been validated in all clinical situations. eGFR's persistently <60 mL/min signify possible Chronic Kidney Disease.    Anion gap 5 5 - 15  CBC     Status: Abnormal   Collection Time: 05/18/15  6:05 AM  Result Value Ref Range   WBC 14.9 (H) 4.0 - 10.5 K/uL   RBC 2.79 (L) 3.87 - 5.11 MIL/uL   Hemoglobin 8.6 (L) 12.0 - 15.0 g/dL   HCT 25.1 (L) 36.0 - 46.0 %   MCV 90.0 78.0 - 100.0 fL   MCH 30.8 26.0 - 34.0 pg   MCHC 34.3 30.0 - 36.0 g/dL   RDW 14.9 11.5 - 15.5 %   Platelets 278 150 - 400 K/uL  Magnesium     Status: Abnormal    Collection Time: 05/18/15  6:05 AM  Result Value Ref Range   Magnesium 1.6 (L) 1.7 - 2.4 mg/dL  Glucose, capillary     Status: None   Collection Time: 05/18/15  7:41 AM  Result Value Ref Range   Glucose-Capillary 80 65 - 99 mg/dL  Glucose, capillary     Status: None   Collection Time: 05/18/15 11:41 AM  Result Value Ref Range   Glucose-Capillary 75 65 - 99 mg/dL  Glucose, capillary     Status: None   Collection Time: 05/18/15  4:30 PM  Result Value Ref Range   Glucose-Capillary 69 65 - 99 mg/dL  Glucose, capillary     Status: None   Collection Time: 05/18/15  4:45 PM  Result Value Ref Range   Glucose-Capillary 66 65 - 99 mg/dL  Glucose, capillary     Status: None   Collection Time: 05/18/15  5:08 PM  Result Value Ref Range   Glucose-Capillary 67 65 - 99 mg/dL  Glucose, capillary     Status: None   Collection Time: 05/18/15  5:38 PM  Result Value Ref Range   Glucose-Capillary 78 65 - 99 mg/dL  Glucose, capillary     Status: Abnormal   Collection Time: 05/18/15  8:06 PM  Result Value Ref Range   Glucose-Capillary 105 (H) 65 - 99 mg/dL  Glucose, capillary     Status: Abnormal   Collection Time: 05/19/15 12:00 AM  Result Value Ref Range   Glucose-Capillary 119 (H) 65 - 99 mg/dL  Urinalysis, Routine w reflex microscopic (not at [facility])     Status: Abnormal   Collection Time: 05/19/15 12:08 AM  Result Value Ref Range   Color, Urine AMBER (A) YELLOW    Comment: BIOCHEMICALS MAY BE AFFECTED BY COLOR   APPearance CLEAR CLEAR   Specific Gravity, Urine 1.016 1.005 - 1.030   pH 5.5 5.0 - 8.0   Glucose, UA NEGATIVE NEGATIVE mg/dL   Hgb urine dipstick NEGATIVE NEGATIVE   Bilirubin Urine NEGATIVE NEGATIVE   Ketones, ur NEGATIVE NEGATIVE mg/dL   Protein, ur NEGATIVE NEGATIVE mg/dL   Urobilinogen, UA 1.0 0.0 - 1.0 mg/dL   Nitrite NEGATIVE NEGATIVE   Leukocytes, UA TRACE (A) NEGATIVE  Urine microscopic-add on     Status: Abnormal   Collection Time: 05/19/15 12:08 AM  Result Value  Ref Range   WBC, UA 3-6 <3 WBC/hpf   Bacteria, UA FEW (A) RARE   Casts HYALINE CASTS (A) NEGATIVE   Urine-Other FEW YEAST     Comment: MUCOUS PRESENT  Basic metabolic panel     Status: Abnormal   Collection Time: 05/19/15  5:02 AM  Result Value Ref Range   Sodium 140 135 - 145 mmol/L   Potassium 4.2 3.5 - 5.1 mmol/L    Comment: DELTA CHECK NOTED REPEATED TO VERIFY NO VISIBLE HEMOLYSIS    Chloride 112 (H) 101 - 111 mmol/L   CO2 23 22 - 32 mmol/L   Glucose, Bld 94 65 - 99 mg/dL   BUN 11 6 - 20 mg/dL   Creatinine, Ser 0.86 0.44 - 1.00 mg/dL   Calcium 7.6 (L) 8.9 - 10.3 mg/dL   GFR calc non Af Amer 56 (L) >60 mL/min   GFR calc Af Amer >60 >60 mL/min    Comment: (NOTE) The eGFR has been calculated using the CKD EPI equation. This calculation has not been validated in all clinical situations. eGFR's persistently <60 mL/min signify possible Chronic Kidney Disease.    Anion gap 5 5 - 15  CBC     Status: Abnormal   Collection Time: 05/19/15  5:02 AM  Result Value Ref Range   WBC 18.2 (H) 4.0 - 10.5 K/uL   RBC 3.08 (L) 3.87 - 5.11 MIL/uL   Hemoglobin 9.5 (L) 12.0 - 15.0 g/dL   HCT 27.6 (L) 36.0 - 46.0 %   MCV 89.6 78.0 - 100.0 fL   MCH 30.8 26.0 - 34.0 pg   MCHC 34.4 30.0 - 36.0 g/dL   RDW 14.9 11.5 - 15.5 %   Platelets 347 150 - 400 K/uL    Imaging / Studies: Ct Abdomen Pelvis Wo Contrast  05/18/2015   CLINICAL DATA:  Leukocytosis. Laparoscopic gastric ulcer surgery 05/11/2015 with persistent abdominal pain.  EXAM: CT ABDOMEN AND PELVIS WITHOUT CONTRAST  TECHNIQUE: Multidetector CT imaging of the abdomen and pelvis was performed following the standard protocol without IV contrast.  COMPARISON:  05/11/2015  FINDINGS: Lung bases demonstrate worsening moderate size right effusion and small left effusion with associated compressive atelectasis in the right base. Mild stable cardiomegaly with atherosclerotic coronary artery disease unchanged. Central venous line seen with tip over the  cavoatrial junction.  Abdominal images demonstrate the stomach to be mildly distended with air and minimal wall thickening over the distal stomach/ proximal duodenum likely postsurgical. This mild wall thickening/ edema could be causing a degree of obstruction. No evidence of free peritoneal air. Small amount of perihepatic fluid. Minimal density just below the splenic flexure likely postsurgical.  There are a couple sub cm stable liver hypodensities likely cysts. Suggestion mild contrast excretion within the gallbladder. The spleen, pancreas and adrenal glands are within normal. Kidneys normal in size without nephrolithiasis. There is subtle symmetric prominence of the intrarenal collecting systems. Appendix not seen.  Contrast and air are present throughout the colon. There is mild diverticulosis over the sigmoid colon. There are several minimally dilated small bowel loops likely postoperative ileus.  Abdominal wall incision anteriorly in the midline. Bilateral subcutaneous edema over the abdominal wall and pelvic soft tissues.  Calcified plaque of the abdominal aorta and iliac arteries.  Pelvic images demonstrate a Foley catheter present within a decompressed bladder. There is mild fecal retention over the  rectum. There is a small amount of free pelvic fluid present.  There are degenerative changes of the spine with multilevel disc disease over the lumbar spine and subtle grade 1 anterolisthesis of L3 on L4 and L4 on L5 unchanged. Mild degenerate change of the hips.  IMPRESSION: Mild gaseous distention of the stomach with minimal wall thickening over the distal stomach/ proximal duodenum likely postsurgical edema and may be causing a mild degree of obstruction. Several mildly dilated small bowel loops likely postoperative ileus. Small amount of peritoneal fluid likely postsurgical. No free peritoneal air.  Worsening moderate size right effusion and small left effusion with associated compressive atelectasis in  the right base.  Mild diverticulosis of the sigmoid colon.  Two small subcentimeter liver hypodensities unchanged likely cysts.  Stable cardiomegaly and atherosclerotic coronary artery disease.  Other stable chronic finding as described.   Electronically Signed   By: Marin Olp M.D.   On: 05/18/2015 14:25   Dg Chest 2 View  05/18/2015   CLINICAL DATA:  Leukocytosis. No chest complaints. History of hypertension, asthma, dementia.  EXAM: CHEST  2 VIEW  COMPARISON:  05/16/2015 and earlier  FINDINGS: Right-sided PICC line tip overlies the level of the right atrium. Right scratched of bilateral pleural effusions are present, right greater than left. Bilateral lung opacities obscure the hemidiaphragms bilaterally, right greater than left. Little change since prior study. Residual contrast identified in the colon. Cardiomegaly.  IMPRESSION: 1. Persistent bilateral infiltrates, right greater than left. 2. Bilateral pleural effusions.   Electronically Signed   By: Nolon Nations M.D.   On: 05/18/2015 13:47    Medications / Allergies:  Scheduled Meds: . albuterol  2.5 mg Nebulization Q12H  . antiseptic oral rinse  7 mL Mouth Rinse q12n4p  . chlorhexidine  15 mL Mouth Rinse BID  . furosemide  40 mg Intravenous Daily  . heparin  5,000 Units Subcutaneous 3 times per day  . metronidazole  500 mg Intravenous Q8H  . mirtazapine  15 mg Oral QHS  . pantoprazole (PROTONIX) IV  80 mg Intravenous Q12H  . potassium chloride  20 mEq Oral BID WC   Continuous Infusions:  PRN Meds:.acetaminophen, albuterol, fentaNYL (SUBLIMAZE) injection, iohexol, ondansetron **OR** ondansetron (ZOFRAN) IV, sodium chloride, traMADol  Antibiotics: Anti-infectives    Start     Dose/Rate Route Frequency Ordered Stop   05/13/15 0600  ciprofloxacin (CIPRO) IVPB 400 mg     400 mg 200 mL/hr over 60 Minutes Intravenous Every 24 hours 05/12/15 0757 05/19/15 0734   05/12/15 0800  metroNIDAZOLE (FLAGYL) IVPB 500 mg     500 mg 100 mL/hr  over 60 Minutes Intravenous Every 8 hours 05/12/15 0725 05/19/15 0759   05/12/15 0730  ciprofloxacin (CIPRO) IVPB 400 mg  Status:  Discontinued     400 mg 200 mL/hr over 60 Minutes Intravenous Every 12 hours 05/12/15 0725 05/12/15 0756   05/12/15 0600  ciprofloxacin (CIPRO) IVPB 400 mg     400 mg 200 mL/hr over 60 Minutes Intravenous Every 24 hours 05/11/15 1118 05/12/15 0648   05/11/15 1100  metroNIDAZOLE (FLAGYL) IVPB 500 mg     500 mg 100 mL/hr over 60 Minutes Intravenous Every 8 hours 05/11/15 1036 05/11/15 1227   05/11/15 1045  ciprofloxacin (CIPRO) IVPB 400 mg  Status:  Discontinued     400 mg 200 mL/hr over 60 Minutes Intravenous Every 12 hours 05/11/15 1036 05/11/15 1117   05/11/15 0515  ciprofloxacin (CIPRO) IVPB 400 mg     400  mg 200 mL/hr over 60 Minutes Intravenous  Once 05/11/15 0503 05/11/15 0656   05/11/15 0515  metroNIDAZOLE (FLAGYL) IVPB 500 mg     500 mg 100 mL/hr over 60 Minutes Intravenous  Once 05/11/15 0503 05/11/15 1674         Assessment/Plan POD#8 diagnostic laparoscopy converted to open repair gastric ulcer and omental patch---Dr. Joyice Faster -CT a/p did not reveal an abscess.  Showed a degree of wall thickening distal stomach/small bowel.  No evidence of obstruction, but monitor closely. -plan to resume to D3 diet after pulm eval -continue PPI, ?when to change to $RemoveB'40mg'pEdDlOYf$  BID -minimize narcotics -reinforce IS(only pulling 250 now) ID-cipro/flagyl D#7, continue.  UA is negative.  CT showed a large effusion on the right.  Question whether this could be the source.  No intra-abdominal infection, wound is clean.  Also ask ID to weigh in.  h pylori is indeterminate, she's been treated before. Acute on chronic blood loss anemia -s/p 1u PRBCs 6/24. B12, folate normal Acute on chronic kidney disease, stage III-stable. Dyspnea and pulmonary edema-diuresing. Appreciate IM/pulm management  GU-continue foley for diuresing  HTN-stable. Home meds on hold VTE  prophylaxis-SCD/heparin FEN-no issues Dementia/AMS-remeron.  Dispo-continue inpatient.  SNF when medically stable  Erby Pian, [facility] Pager [phone])   05/19/2015 7:54 AM

## 2015-05-19 NOTE — Procedures (Signed)
Thoracentesis Procedure Note  Pre-operative Diagnosis: Right pleural effusion  Post-operative Diagnosis: same  Indications: Dyspnea, persistent leukocytosis  Procedure Details  Consent: Informed consent was obtained. Risks of the procedure were discussed including: infection, bleeding, pain, pneumothorax.  Ultrasound was used to identify the best pocket of fluid.  The fluid collection was bigger than when we last visualized with ultrasound on 05/17/15.  Under sterile conditions the patient was positioned. Betadine solution and sterile drapes were utilized.  1% buffered lidocaine was used to anesthetize the 5th rib space. Fluid was obtained without any difficulties and minimal blood loss.  A dressing was applied to the wound and wound care instructions were provided.   Findings 400 ml of clear pleural fluid flowed freely from the R pleural space with gentle aspiration, but no more fluid could be obtained. A sample was sent to Pathology for cytogenetics, flow, and cell counts, as well as for infection analysis.  Complications:  None; patient tolerated the procedure well.          Condition: stable  Plan A follow up chest x-ray was ordered. Bed Rest for 0 hours. Tylenol 650 mg. for pain.  Attending Attestation: I performed the procedure.  Roselie Awkward, MD [place] [facility] Pager: [phone] Cell: [phone] After 3pm or if no response, call [phone]

## 2015-05-19 NOTE — Progress Notes (Signed)
[facility] Consult progress note                                                                               Patient Demographics  Elizabeth Peterson, is a 81 y.o. female, DOB - Feb 02, 1919, IOE:[id number]  Admit date - 05/11/2015   Admitting Physician Leighton Ruff, MD  Outpatient Primary MD for the patient is REED, TIFFANY, DO  LOS - 8   Chief Complaint  Patient presents with  . Abdominal Pain       Brief HPI   Patient is a 81 year old female with hypertension, hyperlipidemia, dementia (confirmed with granddaughter at the bedside), GERD, anemi, wheelchair-bound due to osteoarthritis a had originally presented on 8/18 to the ED with moderate generalized abdominal pain for several weeks which worsened her day before the admission. She also had associated nausea and vomiting on the day of admission. Patient had significant tenderness with rebound and guarding and was found to have perforated gastric ulcer. Patient underwent laparoscopic repair of the gastric ulcer and an omental patch. Postoperative care was managed by critical care service, postop period was complicated by ongoing hypotension, acute kidney injury, worsening mental status superimposed on dementia. She was also noticed to have small pleural effusion with wheezing.  Palliative medicine was also consulted for goals of care. Internal medicine consulted today for medical comanagement.    Assessment & Plan    Active Problems: Dyspnea with pulmonary edema- shortness of breath has significantly improved - Continue IV Lasix until euvolemic and balanced I's and O's. 9.5 L net positive - Swallow evaluation done, patient has aspiration, recommending dysphagia 3 diet. Lungs however are clear.   Right-sided pleural effusion worsening, increasing leukocytosis - Discussed with surgery team, I have broadened antibiotics coverage to vancomycin and aztreonam (patient is allergic to penicillin) - Blood cultures  negative so far, recommend thoracentesis to rule out empyema or an abscess, ID input - No intra-abdominal abscess seen on the CT abdomen - Chest x-ray showed persistent bilateral infiltrates right greater than left, possibly has underlying aspiration   Gastric ulcer with perforation and open repair - Management per the primary team  - continue IV PPI  Altered mental status with underlying Alzheimer's dementia- overall mental status is  improving  - UA negative for any UTI, B12, folate, TSH all normal. Did not obtain CT head as mental status is improving.    Anemia Postop H&H is stable,  hemoglobin 9.5   Hypokalemia On daily replacement  disposition Plan: Per surgery   Time Spent in minutes 52mins   Procedures  Chest x-ray  DVT Prophylaxis  heparin subcutaneous  Medications  Scheduled Meds: . albuterol  2.5 mg Nebulization Q12H  . antiseptic oral rinse  7 mL Mouth Rinse q12n4p  . chlorhexidine  15 mL Mouth Rinse BID  . furosemide  40 mg Intravenous Daily  . heparin  5,000 Units Subcutaneous 3 times per day  . mirtazapine  15 mg Oral QHS  . pantoprazole (PROTONIX) IV  80 mg Intravenous Q12H  . potassium chloride  20 mEq Oral BID WC   Continuous Infusions:  PRN Meds:.acetaminophen, albuterol, fentaNYL (SUBLIMAZE) injection, iohexol,  ondansetron **OR** ondansetron (ZOFRAN) IV, sodium chloride, traMADol   Antibiotics   Anti-infectives    Start     Dose/Rate Route Frequency Ordered Stop   05/13/15 0600  ciprofloxacin (CIPRO) IVPB 400 mg     400 mg 200 mL/hr over 60 Minutes Intravenous Every 24 hours 05/12/15 0757 05/19/15 0734   05/12/15 0800  metroNIDAZOLE (FLAGYL) IVPB 500 mg     500 mg 100 mL/hr over 60 Minutes Intravenous Every 8 hours 05/12/15 0725 05/19/15 0759   05/12/15 0730  ciprofloxacin (CIPRO) IVPB 400 mg  Status:  Discontinued     400 mg 200 mL/hr over 60 Minutes Intravenous Every 12 hours 05/12/15 0725 05/12/15 0756   05/12/15 0600  ciprofloxacin (CIPRO)  IVPB 400 mg     400 mg 200 mL/hr over 60 Minutes Intravenous Every 24 hours 05/11/15 1118 05/12/15 0648   05/11/15 1100  metroNIDAZOLE (FLAGYL) IVPB 500 mg     500 mg 100 mL/hr over 60 Minutes Intravenous Every 8 hours 05/11/15 1036 05/11/15 1227   05/11/15 1045  ciprofloxacin (CIPRO) IVPB 400 mg  Status:  Discontinued     400 mg 200 mL/hr over 60 Minutes Intravenous Every 12 hours 05/11/15 1036 05/11/15 1117   05/11/15 0515  ciprofloxacin (CIPRO) IVPB 400 mg     400 mg 200 mL/hr over 60 Minutes Intravenous  Once 05/11/15 0503 05/11/15 0656   05/11/15 0515  metroNIDAZOLE (FLAGYL) IVPB 500 mg     500 mg 100 mL/hr over 60 Minutes Intravenous  Once 05/11/15 0503 05/11/15 3546        Subjective:   Elizabeth Peterson was seen and examined  earlier  this exam.  Not feeling too good, no chest pain, low-grade temp 99.8. Patient denies dizziness, chest pain, N/V, new weakness, numbess, tingling. No acute events overnight.   Objective:   Blood pressure 132/67, pulse 106, temperature 99.8 F (37.7 C), temperature source Oral, resp. rate 18, height 5\' 3"  (1.6 m), weight 53.3 kg (117 lb 8.1 oz), SpO2 100 %.  Wt Readings from Last 3 Encounters:  05/12/15 53.3 kg (117 lb 8.1 oz)  12/06/13 50.44 kg (111 lb 3.2 oz)  09/06/13 48.988 kg (108 lb)     Intake/Output Summary (Last 24 hours) at 05/19/15 0842 Last data filed at 05/19/15 5681  Gross per 24 hour  Intake 1886.82 ml  Output   2650 ml  Net -763.18 ml    Exam  General:  Awake, NAD  HEENT:  PERRLA, EOMI  Neck: Supple, no JVD, no masses  CVS: S1 S2clear RRR   Respiratory:  Decreased breath sounds at the bases  Abdomen: Soft, dressing intact  Ext: no cyanosis clubbing or edema  Neuro: no new deficits  Skin: No rashes  Psych: awake and oriented x2   Data Review   Micro Results Recent Results (from the past 240 hour(s))  MRSA PCR Screening     Status: None   Collection Time: 05/11/15  9:55 AM  Result Value Ref Range  Status   MRSA by PCR NEGATIVE NEGATIVE Final    Comment:        The GeneXpert MRSA Assay (FDA approved for NASAL specimens only), is one component of a comprehensive MRSA colonization surveillance program. It is not intended to diagnose MRSA infection nor to guide or monitor treatment for MRSA infections.   Culture, blood (routine x 2)     Status: None (Preliminary result)   Collection Time: 05/15/15 10:15 PM  Result Value Ref Range  Status   Specimen Description BLOOD LEFT ANTECUBITAL  Final   Special Requests IN PEDIATRIC BOTTLE [place]  Final   Culture   Final    NO GROWTH 3 DAYS Performed at [facility]    Report Status PENDING  Incomplete  Culture, blood (routine x 2)     Status: None (Preliminary result)   Collection Time: 05/16/15  7:05 AM  Result Value Ref Range Status   Specimen Description BLOOD RIGHT ARM  Final   Special Requests IN PEDIATRIC BOTTLE 3CC  Final   Culture   Final    NO GROWTH 2 DAYS Performed at [facility]    Report Status PENDING  Incomplete    Radiology Reports Ct Abdomen Pelvis Wo Contrast  05/18/2015   CLINICAL DATA:  Leukocytosis. Laparoscopic gastric ulcer surgery 05/11/2015 with persistent abdominal pain.  EXAM: CT ABDOMEN AND PELVIS WITHOUT CONTRAST  TECHNIQUE: Multidetector CT imaging of the abdomen and pelvis was performed following the standard protocol without IV contrast.  COMPARISON:  05/11/2015  FINDINGS: Lung bases demonstrate worsening moderate size right effusion and small left effusion with associated compressive atelectasis in the right base. Mild stable cardiomegaly with atherosclerotic coronary artery disease unchanged. Central venous line seen with tip over the cavoatrial junction.  Abdominal images demonstrate the stomach to be mildly distended with air and minimal wall thickening over the distal stomach/ proximal duodenum likely postsurgical. This mild wall thickening/ edema could be causing a degree of obstruction.  No evidence of free peritoneal air. Small amount of perihepatic fluid. Minimal density just below the splenic flexure likely postsurgical.  There are a couple sub cm stable liver hypodensities likely cysts. Suggestion mild contrast excretion within the gallbladder. The spleen, pancreas and adrenal glands are within normal. Kidneys normal in size without nephrolithiasis. There is subtle symmetric prominence of the intrarenal collecting systems. Appendix not seen.  Contrast and air are present throughout the colon. There is mild diverticulosis over the sigmoid colon. There are several minimally dilated small bowel loops likely postoperative ileus.  Abdominal wall incision anteriorly in the midline. Bilateral subcutaneous edema over the abdominal wall and pelvic soft tissues.  Calcified plaque of the abdominal aorta and iliac arteries.  Pelvic images demonstrate a Foley catheter present within a decompressed bladder. There is mild fecal retention over the rectum. There is a small amount of free pelvic fluid present.  There are degenerative changes of the spine with multilevel disc disease over the lumbar spine and subtle grade 1 anterolisthesis of L3 on L4 and L4 on L5 unchanged. Mild degenerate change of the hips.  IMPRESSION: Mild gaseous distention of the stomach with minimal wall thickening over the distal stomach/ proximal duodenum likely postsurgical edema and may be causing a mild degree of obstruction. Several mildly dilated small bowel loops likely postoperative ileus. Small amount of peritoneal fluid likely postsurgical. No free peritoneal air.  Worsening moderate size right effusion and small left effusion with associated compressive atelectasis in the right base.  Mild diverticulosis of the sigmoid colon.  Two small subcentimeter liver hypodensities unchanged likely cysts.  Stable cardiomegaly and atherosclerotic coronary artery disease.  Other stable chronic finding as described.   Electronically Signed    By: Marin Olp M.D.   On: 05/18/2015 14:25   Ct Abdomen Pelvis Wo Contrast  05/11/2015   CLINICAL DATA:  Generalized abdominal pain, worse last night.  EXAM: CT ABDOMEN AND PELVIS WITHOUT CONTRAST  TECHNIQUE: Multidetector CT imaging of the abdomen and pelvis was  performed following the standard protocol without IV contrast.  COMPARISON:  None.  FINDINGS: BODY WALL: No contributory findings.  LOWER CHEST: Small bilateral pleural effusion with right basilar atelectasis. Small lower pneumomediastinum through hiatal hernia.  ABDOMEN/PELVIS:  Liver: Few sub cm low densities favor cysts.  Biliary: Haziness of fat around the gallbladder is likely secondary. No primary inflammation suspected.  Pancreas: Unremarkable.  Spleen: Unremarkable.  Adrenals: Unremarkable.  Kidneys and ureters: Mild bilateral renal cortical atrophy. No hydronephrosis.  Bladder: Distended without wall thickening.  Reproductive: Hysterectomy.  No adnexal mass.  Bowel: There is a large volume of pneumoperitoneum distributed throughout the abdomen. Given the large volume, distribution is not helpful in identifying a source. Small volume ascites accompanies the fluid, with probable peritoneal thickening in the pelvic recesses. The source of perforation is uncertain; there is no definitive pneumatosis or ulceration. No inflammatory wall thickening around the gastric antrum or duodenum. The cecum is gas filled and ventrally rotated, but there is no bascule or volvulus and no associated pneumatosis. Oral contrast reaches the third portion duodenum and there is no contrast extravasation.  Vascular: No acute abnormality.  OSSEOUS: No acute abnormalities.  These results were called by telephone at the time of interpretation on 05/11/2015 at 5:02 am to Dr. Julianne Rice , who verbally acknowledged these results.  IMPRESSION: 1. Perforated bowel with large pneumoperitoneum and small ascites. The site of perforation is uncertain; no extravasation of oral  contrast which reaches the third portion duodenum. Colonic diverticulosis is present. 2. Atelectasis and small bilateral pleural effusion.   Electronically Signed   By: Monte Fantasia M.D.   On: 05/11/2015 05:07   Dg Chest 2 View  05/18/2015   CLINICAL DATA:  Leukocytosis. No chest complaints. History of hypertension, asthma, dementia.  EXAM: CHEST  2 VIEW  COMPARISON:  05/16/2015 and earlier  FINDINGS: Right-sided PICC line tip overlies the level of the right atrium. Right scratched of bilateral pleural effusions are present, right greater than left. Bilateral lung opacities obscure the hemidiaphragms bilaterally, right greater than left. Little change since prior study. Residual contrast identified in the colon. Cardiomegaly.  IMPRESSION: 1. Persistent bilateral infiltrates, right greater than left. 2. Bilateral pleural effusions.   Electronically Signed   By: Nolon Nations M.D.   On: 05/18/2015 13:47   Dg Chest Port 1 View  05/16/2015   CLINICAL DATA:  Increasing short of breath  EXAM: PORTABLE CHEST - 1 VIEW  COMPARISON:  05/14/2015  FINDINGS: The heart is moderately enlarged. Pulmonary edema has developed. Opacity at the right base is stable likely a combination of volume loss, consolidation, and pleural fluid. Atelectasis at the left base is stable. Tiny left pleural effusion is not excluded.  IMPRESSION: New onset of pulmonary edema. Bibasilar atelectasis and right pleural effusion are stable.   Electronically Signed   By: Marybelle Killings M.D.   On: 05/16/2015 08:19   Dg Chest Port 1 View  05/14/2015   CLINICAL DATA:  Shortness of breath  EXAM: PORTABLE CHEST - 1 VIEW  COMPARISON:  05/12/2015  FINDINGS: Nasogastric tube tip has been removed. Dilated colon again seen in the left abdomen.  Bilateral basilar opacification, greater on the right. Appearance suggests associated pleural fluid. Unchanged cardiopericardial enlargement. Stable upper mediastinal contours.  IMPRESSION: Stable bibasilar lung  opacity, greater on the right. Admission abdominal CT favors pleural fluid and atelectasis.   Electronically Signed   By: Monte Fantasia M.D.   On: 05/14/2015 06:14   Dg Chest [facility]  1 View  05/12/2015   CLINICAL DATA:  Respiratory failure.  EXAM: PORTABLE CHEST - 1 VIEW  COMPARISON:  05/11/2015.  FINDINGS: NG tube noted coiled in stomach. Mediastinum hilar structures are normal. Heart size stable. Right lower lobe infiltrate consistent pneumonia. Right pleural effusion. Subsegmental atelectasis left lung base. No pneumothorax. Surgical clip left upper abdomen.  IMPRESSION: 1.  NG tube noted coiled in stomach.  2. Right lower lobe infiltrate consistent pneumonia. Moderate right pleural effusion.   Electronically Signed   By: Marcello Moores  Register   On: 05/12/2015 07:10   Dg Chest Port 1 View  05/11/2015   CLINICAL DATA:  Hypoxia  EXAM: PORTABLE CHEST - 1 VIEW  COMPARISON:  02/28/2013  FINDINGS: Normal heart size and aortic contours.  Atelectasis in the right upper lobe seen on the previous study has resolved. There is an indistinct opacity at the right base obscuring the diaphragm which is elevated compared to prior. No edema, effusion, or air leak.  IMPRESSION: Hazy opacity at the right base favoring atelectasis. This area should be seen on abdominal CT scheduled for later today.   Electronically Signed   By: Monte Fantasia M.D.   On: 05/11/2015 03:41   Dg Duanne Limerick W/water Sol Cm  05/16/2015   CLINICAL DATA:  Patient status post repair per stroke perforated gastric ulcer with omental patch.  EXAM: WATER SOLUBLE UPPER GI SERIES  TECHNIQUE: Single-column upper GI series was performed using water soluble contrast.  CONTRAST:  59mL OMNIPAQUE IOHEXOL 300 MG/ML  SOLN  COMPARISON:  None.  FLUOROSCOPY TIME:  Radiation Exposure Index (as provided by the fluoroscopic device):  If the device does not provide the exposure index:  Fluoroscopy Time (in minutes and seconds):  1 minutes 52 seconds  Number of Acquired Images:  8   FINDINGS: Exam is suboptimal due to limited intake of the oral contrast as well as limited patient mobility. The contrast does flow easily into the stomach and ultimately passes into the first and second second portion of the duodenum. Stomach is gas-filled. No evidence of leak of the oral contrast.  IMPRESSION: No evidence of leak of oral contrast from the stomach on limited exam.   Electronically Signed   By: Suzy Bouchard M.D.   On: 05/16/2015 15:04    CBC  Recent Labs Lab 05/14/15 1028 05/16/15 0705 05/17/15 0513 05/18/15 0605 05/19/15 0502  WBC 20.2* 11.7* 12.8* 14.9* 18.2*  HGB 8.6* 7.9* 7.0* 8.6* 9.5*  HCT 26.9* 23.6* 21.2* 25.1* 27.6*  PLT 262 266 268 278 347  MCV 93.7 91.8 91.8 90.0 89.6  MCH 30.0 30.7 30.3 30.8 30.8  MCHC 32.0 33.5 33.0 34.3 34.4  RDW 15.0 15.2 15.3 14.9 14.9    Chemistries   Recent Labs Lab 05/13/15 0350 05/14/15 1028 05/16/15 0705 05/17/15 0513 05/18/15 0605 05/19/15 0502  NA 141 142 144 145 141 140  K 3.2* 3.7 3.2* 3.1* 3.0* 4.2  CL 121* 123* 120* 119* 115* 112*  CO2 15* 15* 15* 18* 21* 23  GLUCOSE 91 90 111* 83 88 94  BUN 27* 17 13 13 11 11   CREATININE 1.37* 1.18* 1.04* 1.02* 1.04* 0.86  CALCIUM 7.1* 7.6* 7.5* 7.3* 7.1* 7.6*  MG 1.4*  --   --  1.3* 1.6*  --    ------------------------------------------------------------------------------------------------------------------ estimated creatinine clearance is 33.1 mL/min (by C-G formula based on Cr of 0.86). ------------------------------------------------------------------------------------------------------------------ No results for input(s): HGBA1C in the last 72 hours. ------------------------------------------------------------------------------------------------------------------ No results for input(s): CHOL, HDL, LDLCALC, TRIG,  CHOLHDL, LDLDIRECT in the last 72  hours. ------------------------------------------------------------------------------------------------------------------ No results for input(s): TSH, T4TOTAL, T3FREE, THYROIDAB in the last 72 hours.  Invalid input(s): FREET3 ------------------------------------------------------------------------------------------------------------------ No results for input(s): VITAMINB12, FOLATE, FERRITIN, TIBC, IRON, RETICCTPCT in the last 72 hours.  Coagulation profile  Recent Labs Lab 05/19/15 0820  INR 1.48    No results for input(s): DDIMER in the last 72 hours.  Cardiac Enzymes No results for input(s): CKMB, TROPONINI, MYOGLOBIN in the last 168 hours.  Invalid input(s): CK ------------------------------------------------------------------------------------------------------------------ Invalid input(s): [place]  05/18/15 1645 05/18/15 1708 05/18/15 1738 05/18/15 2006 05/19/15 05/19/15 0755  GLUCAP 66 43 78 105* 119* 102*     Seairra Otani M.D. [facility] 05/19/2015, 8:42 AM  Pager: [phone] Between 7am to 7pm - call Pager - [phone]  After 7pm go to [email or web address] - password [email or web address]  Call night coverage person covering after 7pm

## 2015-05-19 NOTE — Clinical Social Work Note (Addendum)
Clinical Social Work Assessment  Patient Details  Name: Elizabeth Peterson MRN: [id number] Date of Birth: May 27, 1919  Date of referral:  05/19/15               Reason for consult:  Discharge Planning                Permission sought to share information with:  Family Supports Permission granted to share information::  Yes, Verbal Permission Granted  Name::     Insurance account manager::     Relationship::  dtr  Contact Information:     Housing/Transportation Living arrangements for the past 2 months:  Single Family Home Source of Information:  Patient Patient Interpreter Needed:  None Criminal Activity/Legal Involvement Pertinent to Current Situation/Hospitalization:  No - Comment as needed Significant Relationships:  Adult Children, Siblings Lives with:  Adult Children, Siblings Do you feel safe going back to the place where you live?  Yes Need for family participation in patient care:  Yes (Comment)  Care giving concerns:  PT reports patient is requiring +2 assist and might require higher level of care at DC.    Social Worker assessment / plan:  CSW met with patient and sister at bedside. Patient lives at home with dtr, son-in-law, and sister. Patient has 24 hour care and is never left home alone. CSW spoke with patient and sister about DC plans and if SNF placement was desired. Patient reports she would prefer to return home but sister reports that dtr or granddtr would be best to make that decision. CSW left SNF list and explained process.   CSW called granddtr Olin Hauser) but phone was answered and individual hung up on CSW. CSW attempted to call back but phone continued to go to voicemail. CSW will await return call. CSW left a message with dtr Elvin So) as well.   CSW received call from [address] [facility] [address] at [facility] who is requesting patient's prior level of functioning prior to approving SNF placement. CSW to discuss plans with family and will call [place] back at [phone] ext  [phone].   Employment status:  Disabled (Comment on whether or not currently receiving Disability) Insurance information:  Programmer, applications PT Recommendations:  24 Hour Supervision, [place], Home with [place] / Referral to community resources:  [place]  Patient/Family's Response to care:  Patient engaged but sister reports that patient will not have final decision since she lives with family.  Patient/Family's Understanding of and Emotional Response to Diagnosis, Current Treatment, and Prognosis:  Family reports they have been caring for patient for several years and that the family works well together to meet patient's needs. Sister and patient report they are unsure of DC plans and prognosis but that other family would need to be involved.  Emotional Assessment Appearance:  Appears stated age Attitude/Demeanor/Rapport:  Other (Cooperative) Affect (typically observed):  Appropriate Orientation:  Oriented to Self, Oriented to Place Alcohol / Substance use:  Not Applicable Psych involvement (Current and /or in the community):  No (Comment)  Discharge Needs  Concerns to be addressed:  No discharge needs identified Readmission within the last 30 days:  No Current discharge risk:  None Barriers to Discharge:  No Barriers Identified   Boone Master, [place] 05/19/2015, 9:31 AM [phone]   Addendum 1030 CSW received return call from dtr Elvin So) who reports family has been caring for patient and she will return home with 24/7 care. Patient would not want SNF placement and family feels they  can manage patient's needs at home. Family requested hospital bed and [facility]. CSW made CM aware of patient/family's desires. CSW is signing off but available if further needs arise.

## 2015-05-20 DIAGNOSIS — D62 Acute posthemorrhagic anemia: Secondary | ICD-10-CM

## 2015-05-20 DIAGNOSIS — J4540 Moderate persistent asthma, uncomplicated: Secondary | ICD-10-CM

## 2015-05-20 DIAGNOSIS — F418 Other specified anxiety disorders: Secondary | ICD-10-CM

## 2015-05-20 LAB — BASIC METABOLIC PANEL
Anion gap: 3 — ABNORMAL LOW (ref 5–15)
BUN: 11 mg/dL (ref 6–20)
CHLORIDE: 110 mmol/L (ref 101–111)
CO2: 24 mmol/L (ref 22–32)
Calcium: 7.4 mg/dL — ABNORMAL LOW (ref 8.9–10.3)
Creatinine, Ser: 0.82 mg/dL (ref 0.44–1.00)
GFR calc Af Amer: >60 mL/min (ref >60)
GFR calc non Af Amer: 59 mL/min — ABNORMAL LOW (ref >60)
Glucose, Bld: 72 mg/dL (ref 65–99)
POTASSIUM: 4.6 mmol/L (ref 3.5–5.1)
SODIUM: 137 mmol/L (ref 135–145)

## 2015-05-20 LAB — TYPE AND SCREEN
ABO/RH(D): AB POS
Antibody Screen: NEGATIVE
UNIT DIVISION: 0
Unit division: 0

## 2015-05-20 LAB — GLUCOSE, CAPILLARY
GLUCOSE-CAPILLARY: 72 mg/dL (ref 65–99)
GLUCOSE-CAPILLARY: 84 mg/dL (ref 65–99)
Glucose-Capillary: 63 mg/dL — ABNORMAL LOW (ref 65–99)
Glucose-Capillary: 68 mg/dL (ref 65–99)
Glucose-Capillary: 110 mg/dL — ABNORMAL HIGH (ref 65–99)
Glucose-Capillary: 107 mg/dL — ABNORMAL HIGH (ref 65–99)

## 2015-05-20 LAB — CBC
HEMATOCRIT: 25.4 % — AB (ref 36.0–46.0)
HEMOGLOBIN: 8.7 g/dL — AB (ref 12.0–15.0)
MCH: 31.2 pg (ref 26.0–34.0)
MCHC: 34.3 g/dL (ref 30.0–36.0)
MCV: 91 fL (ref 78.0–100.0)
Platelets: 343 10*3/uL (ref 150–400)
RBC: 2.79 MIL/uL — AB (ref 3.87–5.11)
RDW: 15.4 % (ref 11.5–15.5)
WBC: 15.4 10*3/uL — ABNORMAL HIGH (ref 4.0–10.5)

## 2015-05-20 LAB — CULTURE, BLOOD (ROUTINE X 2): CULTURE: NO GROWTH

## 2015-05-20 LAB — FUNGAL STAIN: FUNGAL SMEAR: NONE SEEN

## 2015-05-20 LAB — URINE CULTURE: Culture: 50000

## 2015-05-20 NOTE — Progress Notes (Signed)
SLP Cancellation Note    Patient Details Name: Elizabeth Peterson MRN: [id number] DOB: April 27, 1919   Cancelled treatment:       Reason Eval/Treat Not Completed: New orders received for swallow eval; called MD for clarification - swallow eval completed 8/25 with recs for dysphagia 3 (mechanical soft) due to absence of teeth.  (Apparently pt was eating soft foods PTA per report.) Unless function has declined, repeat assessment not warranted.   Left message with RN re: above;  provided pager #  in case she has questions.    Juan Quam Laurice 05/20/2015, 1:04 PM Pager [phone]

## 2015-05-20 NOTE — Progress Notes (Signed)
General Surgery Note  LOS: 9 days  POD -  9 Days Post-Op  Assessment/Plan: 1.  LAPAROSCOPY DIAGNOSTIC converted open repair gastric ulcerand omental patch - 05/11/2015 - [facility]  Diet - dysphagia 3 - it looks like her evaluation was incomplete on 8/24, but then Ms. Ileana Roup signed off.   I think that she was on reg food prior to admission.  Will reconsult speech therapy.  Will switch Protonix to po.  She has actually done well with perf ulcer.  2.  HTN 3.  Dementia 4.  Leukocytosis]  WBC - 15,400 - 05/20/2015  Vanc/Azactam - 8/26 >>> 5.  Right pleural effusion - followed by Dr. Joya Gaskins (who has signed off)  6. DVT prophylaxis - SQ heparin 7.  Chronic leg pain  She was given NSAID's for this prior to her perforation.  This is going to be a chronic problem.  Currently getting Ultram. 8.  Disposition - she is getting close to be discharged.  I assume she will need SNF.  Social services aware and working on plans 9. Has foley. 10. Anemia - Hgb - 8.7 - 05/20/2015   Principal Problem:   Gastric ulcer with perforation s/p open omental patch 05/11/2015 Active Problems:   Acute blood loss anemia   Essential hypertension   KNEE PAIN, BILATERAL   Anxiety and depression   Severe protein-calorie malnutrition   Alzheimer's disease   Pleural effusion   Asthmatic bronchitis   Palliative care encounter   Dyspnea   Wheezing   Subjective:  Alert and complains of leg pain.  But otherwise, pleasantly demented.  Objective:   Filed Vitals:   05/20/15 0627  BP: 134/71  Pulse: 104  Temp: 99 F (37.2 C)  Resp: 22     Intake/Output from previous day:  08/26 0701 - 08/27 0700 In: 870 [P.O.:420; IV Piggyback:450] Out: 800 [Urine:800]  Intake/Output this shift:  Total I/O In: 200 [P.O.:200] Out: 175 [Urine:175]   Physical Exam:   General: Older AA F who is alert.    HEENT: Normal. Pupils equal. .   Lungs: Clear and equal.   Abdomen: Soft.  BS present   Wound: Her midline incision  looks good.   Lab Results:    Recent Labs  05/19/15 0502 05/20/15 0500  WBC 18.2* 15.4*  HGB 9.5* 8.7*  HCT 27.6* 25.4*  PLT 347 343    BMET   Recent Labs  05/19/15 0502 05/20/15 0500  NA 140 137  K 4.2 4.6  CL 112* 110  CO2 23 24  GLUCOSE 94 72  BUN 11 11  CREATININE 0.86 0.82  CALCIUM 7.6* 7.4*    PT/INR   Recent Labs  05/19/15 0820  LABPROT 18.0*  INR 1.48    ABG  No results for input(s): PHART, HCO3 in the last 72 hours.  Invalid input(s): PCO2, PO2   Studies/Results:  Ct Abdomen Pelvis Wo Contrast  05/18/2015   CLINICAL DATA:  Leukocytosis. Laparoscopic gastric ulcer surgery 05/11/2015 with persistent abdominal pain.  EXAM: CT ABDOMEN AND PELVIS WITHOUT CONTRAST  TECHNIQUE: Multidetector CT imaging of the abdomen and pelvis was performed following the standard protocol without IV contrast.  COMPARISON:  05/11/2015  FINDINGS: Lung bases demonstrate worsening moderate size right effusion and small left effusion with associated compressive atelectasis in the right base. Mild stable cardiomegaly with atherosclerotic coronary artery disease unchanged. Central venous line seen with tip over the cavoatrial junction.  Abdominal images demonstrate the stomach to be mildly distended with air and  minimal wall thickening over the distal stomach/ proximal duodenum likely postsurgical. This mild wall thickening/ edema could be causing a degree of obstruction. No evidence of free peritoneal air. Small amount of perihepatic fluid. Minimal density just below the splenic flexure likely postsurgical.  There are a couple sub cm stable liver hypodensities likely cysts. Suggestion mild contrast excretion within the gallbladder. The spleen, pancreas and adrenal glands are within normal. Kidneys normal in size without nephrolithiasis. There is subtle symmetric prominence of the intrarenal collecting systems. Appendix not seen.  Contrast and air are present throughout the colon. There is mild  diverticulosis over the sigmoid colon. There are several minimally dilated small bowel loops likely postoperative ileus.  Abdominal wall incision anteriorly in the midline. Bilateral subcutaneous edema over the abdominal wall and pelvic soft tissues.  Calcified plaque of the abdominal aorta and iliac arteries.  Pelvic images demonstrate a Foley catheter present within a decompressed bladder. There is mild fecal retention over the rectum. There is a small amount of free pelvic fluid present.  There are degenerative changes of the spine with multilevel disc disease over the lumbar spine and subtle grade 1 anterolisthesis of L3 on L4 and L4 on L5 unchanged. Mild degenerate change of the hips.  IMPRESSION: Mild gaseous distention of the stomach with minimal wall thickening over the distal stomach/ proximal duodenum likely postsurgical edema and may be causing a mild degree of obstruction. Several mildly dilated small bowel loops likely postoperative ileus. Small amount of peritoneal fluid likely postsurgical. No free peritoneal air.  Worsening moderate size right effusion and small left effusion with associated compressive atelectasis in the right base.  Mild diverticulosis of the sigmoid colon.  Two small subcentimeter liver hypodensities unchanged likely cysts.  Stable cardiomegaly and atherosclerotic coronary artery disease.  Other stable chronic finding as described.   Electronically Signed   By: Marin Olp M.D.   On: 05/18/2015 14:25   Dg Chest 2 View  05/18/2015   CLINICAL DATA:  Leukocytosis. No chest complaints. History of hypertension, asthma, dementia.  EXAM: CHEST  2 VIEW  COMPARISON:  05/16/2015 and earlier  FINDINGS: Right-sided PICC line tip overlies the level of the right atrium. Right scratched of bilateral pleural effusions are present, right greater than left. Bilateral lung opacities obscure the hemidiaphragms bilaterally, right greater than left. Little change since prior study. Residual  contrast identified in the colon. Cardiomegaly.  IMPRESSION: 1. Persistent bilateral infiltrates, right greater than left. 2. Bilateral pleural effusions.   Electronically Signed   By: Nolon Nations M.D.   On: 05/18/2015 13:47   Dg Chest Port 1 View  05/19/2015   CLINICAL DATA:  81 year old female with right pleural effusion -status post thoracentesis.  EXAM: PORTABLE CHEST - 1 VIEW  COMPARISON:  05/19/2015 and prior exams  FINDINGS: Right lower lung atelectasis and probable right pleural effusion noted.  There is no evidence of pneumothorax.  Cardiomegaly and right PICC line with tip overlying the superior cavoatrial junction again noted.  Mild left basilar atelectasis is noted.  IMPRESSION: Little significant change in appearance of the chest. No evidence of pneumothorax identified on the study.  Bilateral lower lung atelectasis, right greater than left, and probable right pleural effusion again noted.   Electronically Signed   By: Margarette Canada M.D.   On: 05/19/2015 15:31   Dg Chest Port 1 View  05/19/2015   CLINICAL DATA:  Status post thoracentesis right side. History of asthma, hypertension, pneumonia, left mastectomy.  EXAM: PORTABLE CHEST -  1 VIEW  COMPARISON:  05/18/2015  FINDINGS: Right-sided PICC line tip overlies the level of the lower superior vena cava, unchanged. There continues be dense opacification at the right lung base obscuring the hemidiaphragm. Horizontal level of the pleural effusion raises the question of air within the pleural space although no definite pneumothorax can be identified. There is persistent opacity in the left lung base as well. EKG lead overlies the left lung apex. Residual contrast is seen the colon.  IMPRESSION: 1. Status post right pneumothorax scratched a status post right thoracentesis. 2. Question of small right pneumothorax.  Follow-up is recommended. The salient findings were discussed with Noe Gens on 05/19/2015 at 1:41 pm.   Electronically Signed   By:  Nolon Nations M.D.   On: 05/19/2015 13:41     Anti-infectives:   Anti-infectives    Start     Dose/Rate Route Frequency Ordered Stop   05/19/15 2200  vancomycin (VANCOCIN) 500 mg in sodium chloride 0.9 % 100 mL IVPB     500 mg 100 mL/hr over 60 Minutes Intravenous Every 12 hours 05/19/15 0934     05/19/15 1100  vancomycin (VANCOCIN) IVPB 1000 mg/200 mL premix     1,000 mg 200 mL/hr over 60 Minutes Intravenous  Once 05/19/15 0934 05/19/15 1200   05/19/15 1000  aztreonam (AZACTAM) 1 g in dextrose 5 % 50 mL IVPB     1 g 100 mL/hr over 30 Minutes Intravenous Every 8 hours 05/19/15 0856     05/13/15 0600  ciprofloxacin (CIPRO) IVPB 400 mg     400 mg 200 mL/hr over 60 Minutes Intravenous Every 24 hours 05/12/15 0757 05/19/15 0734   05/12/15 0800  metroNIDAZOLE (FLAGYL) IVPB 500 mg     500 mg 100 mL/hr over 60 Minutes Intravenous Every 8 hours 05/12/15 0725 05/19/15 0759   05/12/15 0730  ciprofloxacin (CIPRO) IVPB 400 mg  Status:  Discontinued     400 mg 200 mL/hr over 60 Minutes Intravenous Every 12 hours 05/12/15 0725 05/12/15 0756   05/12/15 0600  ciprofloxacin (CIPRO) IVPB 400 mg     400 mg 200 mL/hr over 60 Minutes Intravenous Every 24 hours 05/11/15 1118 05/12/15 0648   05/11/15 1100  metroNIDAZOLE (FLAGYL) IVPB 500 mg     500 mg 100 mL/hr over 60 Minutes Intravenous Every 8 hours 05/11/15 1036 05/11/15 1227   05/11/15 1045  ciprofloxacin (CIPRO) IVPB 400 mg  Status:  Discontinued     400 mg 200 mL/hr over 60 Minutes Intravenous Every 12 hours 05/11/15 1036 05/11/15 1117   05/11/15 0515  ciprofloxacin (CIPRO) IVPB 400 mg     400 mg 200 mL/hr over 60 Minutes Intravenous  Once 05/11/15 0503 05/11/15 0656   05/11/15 0515  metroNIDAZOLE (FLAGYL) IVPB 500 mg     500 mg 100 mL/hr over 60 Minutes Intravenous  Once 05/11/15 0503 05/11/15 0619      Alphonsa Overall, MD, FACS Pager: [facility] Office: [phone] 05/20/2015

## 2015-05-20 NOTE — Progress Notes (Signed)
PULMONARY / CRITICAL CARE MEDICINE   Name: Elizabeth Peterson MRN: [id number] DOB: June 02, 1919    ADMISSION DATE:  05/11/2015 CONSULTATION DATE: 8/18  REFERRING MD : CCS  CHIEF COMPLAINT:  Perfed gastric ulcer  INITIAL PRESENTATION: 81 y/o female with dementia.  Abd pain> perforated gastric ulcer.  She had an open repair on 8/18.    STUDIES:    SIGNIFICANT EVENTS: 8/18 p lap   HISTORY OF PRESENT ILLNESS:   Post exp lap   SUBJECTIVE: breathing improved, has been diuresing  VITAL SIGNS: Temp:  [98.3 F (36.8 C)-99 F (37.2 C)] 99 F (37.2 C) (08/27 0627) Pulse Rate:  [98-105] 104 (08/27 0627) Resp:  [18-24] 22 (08/27 0627) BP: (129-134)/(58-71) 134/71 mmHg (08/27 0627) SpO2:  [95 %-99 %] 99 % (08/27 0627) HEMODYNAMICS:     INTAKE / OUTPUT:  Intake/Output Summary (Last 24 hours) at 05/20/15 0747 Last data filed at 05/20/15 4008  Gross per 24 hour  Intake    870 ml  Output    800 ml  Net     70 ml    PHYSICAL EXAMINATION: General:  Frail elderly female in no distress HENT: NCAT OP clear PULM: no wheezing, breathing comfortable CV: RRR, S1/S2 GI: BS+, soft, nontneder MSK: normal bulk and tone Neuro: Awake, confused but conversant  LABS:  CBC  Recent Labs Lab 05/18/15 0605 05/19/15 0502 05/20/15 0500  WBC 14.9* 18.2* 15.4*  HGB 8.6* 9.5* 8.7*  HCT 25.1* 27.6* 25.4*  PLT 278 347 343   Coag's  Recent Labs Lab 05/19/15 0820  INR 1.48   BMET  Recent Labs Lab 05/18/15 0605 05/19/15 0502 05/20/15 0500  NA 141 140 137  K 3.0* 4.2 4.6  CL 115* 112* 110  CO2 21* 23 24  BUN 11 11 11   CREATININE 1.04* 0.86 0.82  GLUCOSE 88 94 72   Electrolytes  Recent Labs Lab 05/17/15 0513 05/18/15 0605 05/19/15 0502 05/20/15 0500  CALCIUM 7.3* 7.1* 7.6* 7.4*  MG 1.3* 1.6*  --   --    Sepsis Markers No results for input(s): LATICACIDVEN, PROCALCITON, O2SATVEN in the last 168 hours. ABG No results for input(s): PHART, PCO2ART, PO2ART in the last  168 hours. Liver Enzymes No results for input(s): AST, ALT, ALKPHOS, BILITOT, ALBUMIN in the last 168 hours. Cardiac Enzymes No results for input(s): TROPONINI, PROBNP in the last 168 hours. Glucose  Recent Labs Lab 05/19/15 05/19/15 0410 05/19/15 0755 05/19/15 1154 05/19/15 1550 05/19/15 2149  GLUCAP 119* 98 102* 89 116* 134*    Imaging CXR from 8/21 reviwed> modearate R effusion   ASSESSMENT / PLAN: Active Problems:   Acute blood loss anemia   Essential hypertension   KNEE PAIN, BILATERAL   Alzheimer's disease   Gastric ulcer with perf and open repair   Pleural effusion   Asthmatic bronchitis   Palliative care encounter   Dyspnea   Wheezing   PULMONARY Moderate R sided pleural effusion > transudate. Slight reaccumulation .  No infection in pleural space.  Likely acute pulmonary edema now improved post diuresis, and reactive to abdominal process  A: P:   O2 as needed to maintain O2 sat > 92% Plan on albuterol bid and q4h prn for about a week, then prn, will need albuterol and a nebulizer machine at home Goal net even, watch fluid status carefully after giving blood  Pulm will sign off , call again PRN   FAMILY  - Updates:noone at bedside - Inter-disciplinary family meet or Palliative Care  meeting due by:  day [place]  [phone]  Cell  [phone]  If no response or cell goes to voicemail, call beeper [phone] 7:49 AM 05/20/2015

## 2015-05-20 NOTE — Progress Notes (Signed)
Hypoglycemic Event  CBG: 63 @0807   Treatment: 15 GM carbohydrate snack  Symptoms: None  Follow-up CBG: Time: 0845 CBG Result: 67  Pt ate breakfast with assist of NT.  Follow up CBG: time: 0923     CBG result: 78   Pt remains asymptomatic.  Possible Reasons for Event: Unknown  Comments/MD notified: Dr. Lucia Gaskins aware of event on rounds/no new orders received.    Derek Jack  Remember to initiate Hypoglycemia Order Set & complete

## 2015-05-20 NOTE — Progress Notes (Signed)
[facility] Consult progress note                                                                               Patient Demographics  Elizabeth Peterson, is a 81 y.o. female, DOB - 1919/08/23, UUV:[id number]  Admit date - 05/11/2015   Admitting Physician Leighton Ruff, MD  Outpatient Primary MD for the patient is REED, TIFFANY, DO  LOS - 9   Chief Complaint  Patient presents with  . Abdominal Pain       Brief HPI   Patient is a 81 year old female with hypertension, hyperlipidemia, dementia (confirmed with granddaughter at the bedside), GERD, anemi, wheelchair-bound due to osteoarthritis a had originally presented on 8/18 to the ED with moderate generalized abdominal pain for several weeks which worsened her day before the admission. She also had associated nausea and vomiting on the day of admission. Patient had significant tenderness with rebound and guarding and was found to have perforated gastric ulcer. Patient underwent laparoscopic repair of the gastric ulcer and an omental patch. Postoperative care was managed by critical care service, postop period was complicated by ongoing hypotension, acute kidney injury, worsening mental status superimposed on dementia. She was also noticed to have small pleural effusion with wheezing.  Palliative medicine was also consulted for goals of care. Internal medicine consulted today for medical comanagement.    Assessment & Plan    Active Problems: Dyspnea with pulmonary edema- shortness of breath has significantly improved - Continue IV Lasix until euvolemic and balanced I's and O's. 9.6 L net positive - Swallow evaluation was done, patient placed on dysphagia 3 diet today by surgery  Right-sided pleural effusion, leukocytosis, possible aspiration pneumonia - Continue vancomycin and aztreonam (patient is allergic to penicillin) - Blood cultures negative so far, thoracentesis done on 8/26, studies consistent with  transudative effusion - No intra-abdominal abscess seen on the CT abdomen - Chest x-ray showed persistent bilateral infiltrates right greater than left, possibly has underlying aspiration   Gastric ulcer with perforation and open repair - Management per the primary team  - continue IV PPI  Altered mental status with underlying Alzheimer's dementia- overall mental status is  improving  - UA negative for any UTI, B12, folate, TSH all normal. Did not obtain CT head as mental status is improving.    Anemia Postop H&H is stable  Hypokalemia On daily replacement  Hypoglycemia: Likely due to poor PO intake  disposition Plan: Per surgery , will likely need skilled nursing facility  Time Spent in minutes 45mins   Procedures  Chest x-ray  DVT Prophylaxis  heparin subcutaneous  Medications  Scheduled Meds: . albuterol  2.5 mg Nebulization Q12H  . antiseptic oral rinse  7 mL Mouth Rinse q12n4p  . aztreonam  1 g Intravenous Q8H  . chlorhexidine  15 mL Mouth Rinse BID  . furosemide  40 mg Intravenous Daily  . heparin  5,000 Units Subcutaneous 3 times per day  . Influenza vac split quadrivalent PF  0.5 mL Intramuscular Tomorrow-1000  . mirtazapine  15 mg Oral QHS  . pantoprazole (PROTONIX) IV  80 mg Intravenous Q12H  . potassium chloride  20 mEq Oral BID WC  . vancomycin  500 mg Intravenous Q12H   Continuous Infusions:  PRN Meds:.acetaminophen, albuterol, fentaNYL (SUBLIMAZE) injection, iohexol, ondansetron **OR** ondansetron (ZOFRAN) IV, sodium chloride, traMADol   Antibiotics   Anti-infectives    Start     Dose/Rate Route Frequency Ordered Stop   05/19/15 2200  vancomycin (VANCOCIN) 500 mg in sodium chloride 0.9 % 100 mL IVPB     500 mg 100 mL/hr over 60 Minutes Intravenous Every 12 hours 05/19/15 0934     05/19/15 1100  vancomycin (VANCOCIN) IVPB 1000 mg/200 mL premix     1,000 mg 200 mL/hr over 60 Minutes Intravenous  Once 05/19/15 0934 05/19/15 1200   05/19/15 1000   aztreonam (AZACTAM) 1 g in dextrose 5 % 50 mL IVPB     1 g 100 mL/hr over 30 Minutes Intravenous Every 8 hours 05/19/15 0856     05/13/15 0600  ciprofloxacin (CIPRO) IVPB 400 mg     400 mg 200 mL/hr over 60 Minutes Intravenous Every 24 hours 05/12/15 0757 05/19/15 0734   05/12/15 0800  metroNIDAZOLE (FLAGYL) IVPB 500 mg     500 mg 100 mL/hr over 60 Minutes Intravenous Every 8 hours 05/12/15 0725 05/19/15 0759   05/12/15 0730  ciprofloxacin (CIPRO) IVPB 400 mg  Status:  Discontinued     400 mg 200 mL/hr over 60 Minutes Intravenous Every 12 hours 05/12/15 0725 05/12/15 0756   05/12/15 0600  ciprofloxacin (CIPRO) IVPB 400 mg     400 mg 200 mL/hr over 60 Minutes Intravenous Every 24 hours 05/11/15 1118 05/12/15 0648   05/11/15 1100  metroNIDAZOLE (FLAGYL) IVPB 500 mg     500 mg 100 mL/hr over 60 Minutes Intravenous Every 8 hours 05/11/15 1036 05/11/15 1227   05/11/15 1045  ciprofloxacin (CIPRO) IVPB 400 mg  Status:  Discontinued     400 mg 200 mL/hr over 60 Minutes Intravenous Every 12 hours 05/11/15 1036 05/11/15 1117   05/11/15 0515  ciprofloxacin (CIPRO) IVPB 400 mg     400 mg 200 mL/hr over 60 Minutes Intravenous  Once 05/11/15 0503 05/11/15 0656   05/11/15 0515  metroNIDAZOLE (FLAGYL) IVPB 500 mg     500 mg 100 mL/hr over 60 Minutes Intravenous  Once 05/11/15 0503 05/11/15 1610        Subjective:   Elizabeth Peterson was seen and examined. Sitting upright, no shortness of breath or any chest pain. Low-grade temp of 99.  Patient denies dizziness, chest pain, N/V, new weakness, numbess, tingling. No acute events overnight.   Objective:   Blood pressure 134/71, pulse 104, temperature 99 F (37.2 C), temperature source Oral, resp. rate 22, height 5\' 3"  (1.6 m), weight 53.3 kg (117 lb 8.1 oz), SpO2 95 %.  Wt Readings from Last 3 Encounters:  05/12/15 53.3 kg (117 lb 8.1 oz)  12/06/13 50.44 kg (111 lb 3.2 oz)  09/06/13 48.988 kg (108 lb)     Intake/Output Summary (Last 24  hours) at 05/20/15 1043 Last data filed at 05/20/15 0908  Gross per 24 hour  Intake    830 ml  Output    900 ml  Net    -70 ml    Exam  General:  Awake, NAD  HEENT:  PERRLA, EOMI  Neck: Supple, no JVD  CVS: S1 S2 clear RRR   Respiratory:  Decreased breath sounds at the bases  Abdomen: Soft, dressing intact  Ext: no cyanosis clubbing or edema  Neuro: no new deficits  Skin: No rashes  Psych: awake and oriented x2   Data Review   Micro Results Recent Results (from the past 240 hour(s))  MRSA PCR Screening     Status: None   Collection Time: 05/11/15  9:55 AM  Result Value Ref Range Status   MRSA by PCR NEGATIVE NEGATIVE Final    Comment:        The GeneXpert MRSA Assay (FDA approved for NASAL specimens only), is one component of a comprehensive MRSA colonization surveillance program. It is not intended to diagnose MRSA infection nor to guide or monitor treatment for MRSA infections.   Culture, blood (routine x 2)     Status: None (Preliminary result)   Collection Time: 05/15/15 10:15 PM  Result Value Ref Range Status   Specimen Description BLOOD LEFT ANTECUBITAL  Final   Special Requests IN PEDIATRIC BOTTLE [place]  Final   Culture   Final    NO GROWTH 4 DAYS Performed at [facility]    Report Status PENDING  Incomplete  Culture, blood (routine x 2)     Status: None (Preliminary result)   Collection Time: 05/16/15  7:05 AM  Result Value Ref Range Status   Specimen Description BLOOD RIGHT ARM  Final   Special Requests IN PEDIATRIC BOTTLE 3CC  Final   Culture   Final    NO GROWTH 3 DAYS Performed at [facility]    Report Status PENDING  Incomplete  Body fluid culture     Status: None (Preliminary result)   Collection Time: 05/19/15 11:56 AM  Result Value Ref Range Status   Specimen Description PLEURAL  Final   Special Requests NONE  Final   Gram Stain   Final    MODERATE WBC PRESENT, PREDOMINANTLY MONONUCLEAR NO ORGANISMS SEEN     Culture   Final    NO GROWTH < 24 HOURS Performed at [facility]    Report Status PENDING  Incomplete    Radiology Reports Ct Abdomen Pelvis Wo Contrast  05/18/2015   CLINICAL DATA:  Leukocytosis. Laparoscopic gastric ulcer surgery 05/11/2015 with persistent abdominal pain.  EXAM: CT ABDOMEN AND PELVIS WITHOUT CONTRAST  TECHNIQUE: Multidetector CT imaging of the abdomen and pelvis was performed following the standard protocol without IV contrast.  COMPARISON:  05/11/2015  FINDINGS: Lung bases demonstrate worsening moderate size right effusion and small left effusion with associated compressive atelectasis in the right base. Mild stable cardiomegaly with atherosclerotic coronary artery disease unchanged. Central venous line seen with tip over the cavoatrial junction.  Abdominal images demonstrate the stomach to be mildly distended with air and minimal wall thickening over the distal stomach/ proximal duodenum likely postsurgical. This mild wall thickening/ edema could be causing a degree of obstruction. No evidence of free peritoneal air. Small amount of perihepatic fluid. Minimal density just below the splenic flexure likely postsurgical.  There are a couple sub cm stable liver hypodensities likely cysts. Suggestion mild contrast excretion within the gallbladder. The spleen, pancreas and adrenal glands are within normal. Kidneys normal in size without nephrolithiasis. There is subtle symmetric prominence of the intrarenal collecting systems. Appendix not seen.  Contrast and air are present throughout the colon. There is mild diverticulosis over the sigmoid colon. There are several minimally dilated small bowel loops likely postoperative ileus.  Abdominal wall incision anteriorly in the midline. Bilateral subcutaneous edema over the abdominal wall and pelvic soft tissues.  Calcified plaque of the abdominal aorta and iliac arteries.  Pelvic images demonstrate a  Foley catheter present within a  decompressed bladder. There is mild fecal retention over the rectum. There is a small amount of free pelvic fluid present.  There are degenerative changes of the spine with multilevel disc disease over the lumbar spine and subtle grade 1 anterolisthesis of L3 on L4 and L4 on L5 unchanged. Mild degenerate change of the hips.  IMPRESSION: Mild gaseous distention of the stomach with minimal wall thickening over the distal stomach/ proximal duodenum likely postsurgical edema and may be causing a mild degree of obstruction. Several mildly dilated small bowel loops likely postoperative ileus. Small amount of peritoneal fluid likely postsurgical. No free peritoneal air.  Worsening moderate size right effusion and small left effusion with associated compressive atelectasis in the right base.  Mild diverticulosis of the sigmoid colon.  Two small subcentimeter liver hypodensities unchanged likely cysts.  Stable cardiomegaly and atherosclerotic coronary artery disease.  Other stable chronic finding as described.   Electronically Signed   By: Marin Olp M.D.   On: 05/18/2015 14:25   Ct Abdomen Pelvis Wo Contrast  05/11/2015   CLINICAL DATA:  Generalized abdominal pain, worse last night.  EXAM: CT ABDOMEN AND PELVIS WITHOUT CONTRAST  TECHNIQUE: Multidetector CT imaging of the abdomen and pelvis was performed following the standard protocol without IV contrast.  COMPARISON:  None.  FINDINGS: BODY WALL: No contributory findings.  LOWER CHEST: Small bilateral pleural effusion with right basilar atelectasis. Small lower pneumomediastinum through hiatal hernia.  ABDOMEN/PELVIS:  Liver: Few sub cm low densities favor cysts.  Biliary: Haziness of fat around the gallbladder is likely secondary. No primary inflammation suspected.  Pancreas: Unremarkable.  Spleen: Unremarkable.  Adrenals: Unremarkable.  Kidneys and ureters: Mild bilateral renal cortical atrophy. No hydronephrosis.  Bladder: Distended without wall thickening.   Reproductive: Hysterectomy.  No adnexal mass.  Bowel: There is a large volume of pneumoperitoneum distributed throughout the abdomen. Given the large volume, distribution is not helpful in identifying a source. Small volume ascites accompanies the fluid, with probable peritoneal thickening in the pelvic recesses. The source of perforation is uncertain; there is no definitive pneumatosis or ulceration. No inflammatory wall thickening around the gastric antrum or duodenum. The cecum is gas filled and ventrally rotated, but there is no bascule or volvulus and no associated pneumatosis. Oral contrast reaches the third portion duodenum and there is no contrast extravasation.  Vascular: No acute abnormality.  OSSEOUS: No acute abnormalities.  These results were called by telephone at the time of interpretation on 05/11/2015 at 5:02 am to Dr. Julianne Rice , who verbally acknowledged these results.  IMPRESSION: 1. Perforated bowel with large pneumoperitoneum and small ascites. The site of perforation is uncertain; no extravasation of oral contrast which reaches the third portion duodenum. Colonic diverticulosis is present. 2. Atelectasis and small bilateral pleural effusion.   Electronically Signed   By: Monte Fantasia M.D.   On: 05/11/2015 05:07   Dg Chest 2 View  05/18/2015   CLINICAL DATA:  Leukocytosis. No chest complaints. History of hypertension, asthma, dementia.  EXAM: CHEST  2 VIEW  COMPARISON:  05/16/2015 and earlier  FINDINGS: Right-sided PICC line tip overlies the level of the right atrium. Right scratched of bilateral pleural effusions are present, right greater than left. Bilateral lung opacities obscure the hemidiaphragms bilaterally, right greater than left. Little change since prior study. Residual contrast identified in the colon. Cardiomegaly.  IMPRESSION: 1. Persistent bilateral infiltrates, right greater than left. 2. Bilateral pleural effusions.   Electronically Signed  By: Nolon Nations M.D.    On: 05/18/2015 13:47   Dg Chest Port 1 View  05/19/2015   CLINICAL DATA:  81 year old female with right pleural effusion -status post thoracentesis.  EXAM: PORTABLE CHEST - 1 VIEW  COMPARISON:  05/19/2015 and prior exams  FINDINGS: Right lower lung atelectasis and probable right pleural effusion noted.  There is no evidence of pneumothorax.  Cardiomegaly and right PICC line with tip overlying the superior cavoatrial junction again noted.  Mild left basilar atelectasis is noted.  IMPRESSION: Little significant change in appearance of the chest. No evidence of pneumothorax identified on the study.  Bilateral lower lung atelectasis, right greater than left, and probable right pleural effusion again noted.   Electronically Signed   By: Margarette Canada M.D.   On: 05/19/2015 15:31   Dg Chest Port 1 View  05/19/2015   CLINICAL DATA:  Status post thoracentesis right side. History of asthma, hypertension, pneumonia, left mastectomy.  EXAM: PORTABLE CHEST - 1 VIEW  COMPARISON:  05/18/2015  FINDINGS: Right-sided PICC line tip overlies the level of the lower superior vena cava, unchanged. There continues be dense opacification at the right lung base obscuring the hemidiaphragm. Horizontal level of the pleural effusion raises the question of air within the pleural space although no definite pneumothorax can be identified. There is persistent opacity in the left lung base as well. EKG lead overlies the left lung apex. Residual contrast is seen the colon.  IMPRESSION: 1. Status post right pneumothorax scratched a status post right thoracentesis. 2. Question of small right pneumothorax.  Follow-up is recommended. The salient findings were discussed with Noe Gens on 05/19/2015 at 1:41 pm.   Electronically Signed   By: Nolon Nations M.D.   On: 05/19/2015 13:41   Dg Chest Port 1 View  05/16/2015   CLINICAL DATA:  Increasing short of breath  EXAM: PORTABLE CHEST - 1 VIEW  COMPARISON:  05/14/2015  FINDINGS: The heart is  moderately enlarged. Pulmonary edema has developed. Opacity at the right base is stable likely a combination of volume loss, consolidation, and pleural fluid. Atelectasis at the left base is stable. Tiny left pleural effusion is not excluded.  IMPRESSION: New onset of pulmonary edema. Bibasilar atelectasis and right pleural effusion are stable.   Electronically Signed   By: Marybelle Killings M.D.   On: 05/16/2015 08:19   Dg Chest Port 1 View  05/14/2015   CLINICAL DATA:  Shortness of breath  EXAM: PORTABLE CHEST - 1 VIEW  COMPARISON:  05/12/2015  FINDINGS: Nasogastric tube tip has been removed. Dilated colon again seen in the left abdomen.  Bilateral basilar opacification, greater on the right. Appearance suggests associated pleural fluid. Unchanged cardiopericardial enlargement. Stable upper mediastinal contours.  IMPRESSION: Stable bibasilar lung opacity, greater on the right. Admission abdominal CT favors pleural fluid and atelectasis.   Electronically Signed   By: Monte Fantasia M.D.   On: 05/14/2015 06:14   Dg Chest Port 1 View  05/12/2015   CLINICAL DATA:  Respiratory failure.  EXAM: PORTABLE CHEST - 1 VIEW  COMPARISON:  05/11/2015.  FINDINGS: NG tube noted coiled in stomach. Mediastinum hilar structures are normal. Heart size stable. Right lower lobe infiltrate consistent pneumonia. Right pleural effusion. Subsegmental atelectasis left lung base. No pneumothorax. Surgical clip left upper abdomen.  IMPRESSION: 1.  NG tube noted coiled in stomach.  2. Right lower lobe infiltrate consistent pneumonia. Moderate right pleural effusion.   Electronically Signed   By: Marcello Moores  Register  On: 05/12/2015 07:10   Dg Chest Port 1 View  05/11/2015   CLINICAL DATA:  Hypoxia  EXAM: PORTABLE CHEST - 1 VIEW  COMPARISON:  02/28/2013  FINDINGS: Normal heart size and aortic contours.  Atelectasis in the right upper lobe seen on the previous study has resolved. There is an indistinct opacity at the right base obscuring the  diaphragm which is elevated compared to prior. No edema, effusion, or air leak.  IMPRESSION: Hazy opacity at the right base favoring atelectasis. This area should be seen on abdominal CT scheduled for later today.   Electronically Signed   By: Monte Fantasia M.D.   On: 05/11/2015 03:41   Dg Duanne Limerick W/water Sol Cm  05/16/2015   CLINICAL DATA:  Patient status post repair per stroke perforated gastric ulcer with omental patch.  EXAM: WATER SOLUBLE UPPER GI SERIES  TECHNIQUE: Single-column upper GI series was performed using water soluble contrast.  CONTRAST:  75mL OMNIPAQUE IOHEXOL 300 MG/ML  SOLN  COMPARISON:  None.  FLUOROSCOPY TIME:  Radiation Exposure Index (as provided by the fluoroscopic device):  If the device does not provide the exposure index:  Fluoroscopy Time (in minutes and seconds):  1 minutes 52 seconds  Number of Acquired Images:  8  FINDINGS: Exam is suboptimal due to limited intake of the oral contrast as well as limited patient mobility. The contrast does flow easily into the stomach and ultimately passes into the first and second second portion of the duodenum. Stomach is gas-filled. No evidence of leak of the oral contrast.  IMPRESSION: No evidence of leak of oral contrast from the stomach on limited exam.   Electronically Signed   By: Suzy Bouchard M.D.   On: 05/16/2015 15:04    CBC  Recent Labs Lab 05/16/15 0705 05/17/15 0513 05/18/15 0605 05/19/15 0502 05/20/15 0500  WBC 11.7* 12.8* 14.9* 18.2* 15.4*  HGB 7.9* 7.0* 8.6* 9.5* 8.7*  HCT 23.6* 21.2* 25.1* 27.6* 25.4*  PLT 266 268 278 347 343  MCV 91.8 91.8 90.0 89.6 91.0  MCH 30.7 30.3 30.8 30.8 31.2  MCHC 33.5 33.0 34.3 34.4 34.3  RDW 15.2 15.3 14.9 14.9 15.4    Chemistries   Recent Labs Lab 05/16/15 0705 05/17/15 0513 05/18/15 0605 05/19/15 0502 05/20/15 0500  NA 144 145 141 140 137  K 3.2* 3.1* 3.0* 4.2 4.6  CL 120* 119* 115* 112* 110  CO2 15* 18* 21* 23 24  GLUCOSE 111* 83 88 94 72  BUN 13 13 11 11 11     CREATININE 1.04* 1.02* 1.04* 0.86 0.82  CALCIUM 7.5* 7.3* 7.1* 7.6* 7.4*  MG  --  1.3* 1.6*  --   --    ------------------------------------------------------------------------------------------------------------------ estimated creatinine clearance is 34.7 mL/min (by C-G formula based on Cr of 0.82). ------------------------------------------------------------------------------------------------------------------ No results for input(s): HGBA1C in the last 72 hours. ------------------------------------------------------------------------------------------------------------------ No results for input(s): CHOL, HDL, LDLCALC, TRIG, CHOLHDL, LDLDIRECT in the last 72 hours. ------------------------------------------------------------------------------------------------------------------ No results for input(s): TSH, T4TOTAL, T3FREE, THYROIDAB in the last 72 hours.  Invalid input(s): FREET3 ------------------------------------------------------------------------------------------------------------------ No results for input(s): VITAMINB12, FOLATE, FERRITIN, TIBC, IRON, RETICCTPCT in the last 72 hours.  Coagulation profile  Recent Labs Lab 05/19/15 0820  INR 1.48    No results for input(s): DDIMER in the last 72 hours.  Cardiac Enzymes No results for input(s): CKMB, TROPONINI, MYOGLOBIN in the last 168 hours.  Invalid input(s): CK ------------------------------------------------------------------------------------------------------------------ Invalid input(s): [place]  05/19/15 1154 05/19/15 1550 05/19/15 2149 05/20/15 0998  05/20/15 0845 05/20/15 0923  GLUCAP 89 116* 134* 65* 86 72     Badr Piedra M.D. [facility] 05/20/2015, 10:43 AM  Pager: [phone] Between 7am to 7pm - call Pager - [phone]  After 7pm go to [email or web address] - password [email or web address]  Call night coverage person covering after 7pm

## 2015-05-20 NOTE — Clinical Social Work Note (Signed)
CSW called and spoke with pt's daughter Elvin So to assess for SNF or discharge services  Pt's daughter does not want pt to go into any SNF for rehab and wants in home PT for pt at discharge  CSW will refer to RN case manager for HH/PT services  .Dede Query, LCSW [facility] Clinical Social Worker - Weekend Coverage cell #: [phone]

## 2015-05-21 DIAGNOSIS — K255 Chronic or unspecified gastric ulcer with perforation: Secondary | ICD-10-CM | POA: Diagnosis not present

## 2015-05-21 LAB — CBC
HEMATOCRIT: 28.4 % — AB (ref 36.0–46.0)
Hemoglobin: 9.5 g/dL — ABNORMAL LOW (ref 12.0–15.0)
MCH: 30.7 pg (ref 26.0–34.0)
MCHC: 33.5 g/dL (ref 30.0–36.0)
MCV: 91.9 fL (ref 78.0–100.0)
PLATELETS: 406 10*3/uL — AB (ref 150–400)
RBC: 3.09 MIL/uL — ABNORMAL LOW (ref 3.87–5.11)
RDW: 15.3 % (ref 11.5–15.5)
WBC: 15.4 10*3/uL — ABNORMAL HIGH (ref 4.0–10.5)

## 2015-05-21 LAB — BASIC METABOLIC PANEL
ANION GAP: 6 (ref 5–15)
BUN: 13 mg/dL (ref 6–20)
CALCIUM: 7.3 mg/dL — AB (ref 8.9–10.3)
CO2: 24 mmol/L (ref 22–32)
CREATININE: 0.8 mg/dL (ref 0.44–1.00)
Chloride: 107 mmol/L (ref 101–111)
GFR calc Af Amer: >60 mL/min (ref >60)
GFR calc non Af Amer: >60 mL/min (ref >60)
GLUCOSE: 86 mg/dL (ref 65–99)
Potassium: 3.9 mmol/L (ref 3.5–5.1)
Sodium: 137 mmol/L (ref 135–145)

## 2015-05-21 LAB — CULTURE, BLOOD (ROUTINE X 2): CULTURE: NO GROWTH

## 2015-05-21 LAB — GLUCOSE, CAPILLARY
Glucose-Capillary: 67 mg/dL (ref 65–99)
Glucose-Capillary: 70 mg/dL (ref 65–99)
Glucose-Capillary: 67 mg/dL (ref 65–99)
Glucose-Capillary: 88 mg/dL (ref 65–99)

## 2015-05-21 LAB — VANCOMYCIN, TROUGH: VANCOMYCIN TR: 21 ug/mL — AB (ref 10.0–20.0)

## 2015-05-21 MED ORDER — FUROSEMIDE 40 MG PO TABS
40.0000 mg | ORAL_TABLET | Freq: Every day | ORAL | Status: DC
  Administered 2015-05-22: 40 mg via ORAL
  Filled 2015-05-21 (×2): qty 1

## 2015-05-21 MED ORDER — LEVOFLOXACIN 750 MG PO TABS
750.0000 mg | ORAL_TABLET | ORAL | Status: DC
  Administered 2015-05-21: 750 mg via ORAL
  Filled 2015-05-21: qty 1

## 2015-05-21 MED ORDER — POTASSIUM CHLORIDE CRYS ER 20 MEQ PO TBCR
20.0000 meq | EXTENDED_RELEASE_TABLET | Freq: Every day | ORAL | Status: DC
  Administered 2015-05-22: 20 meq via ORAL
  Filled 2015-05-21: qty 1

## 2015-05-21 MED ORDER — VANCOMYCIN HCL IN DEXTROSE 750-5 MG/150ML-% IV SOLN: 750.0000 mg | INTRAVENOUS | Status: DC

## 2015-05-21 NOTE — Progress Notes (Signed)
Hypoglycemic Event  CBG: 67 @ 0835  Treatment: 15 GM carbohydrate snack as well as ensure  Symptoms: None  Follow-up CBG: Time: 0923 CBG Result: 70  Possible Reasons for Event: Inadequate meal intake  Comments/MD notified: Dr. Tana Coast aware via text of recent event. Pt remains asymptomatic. No new orders received.    Elizabeth Peterson  Remember to initiate Hypoglycemia Order Set & complete

## 2015-05-21 NOTE — Progress Notes (Signed)
General Surgery Note  LOS: 10 days  POD -  10 Days Post-Op  Assessment/Plan: 1.  LAPAROSCOPY DIAGNOSTIC converted open repair gastric ulcerand omental patch - 05/11/2015 - Marcello Moores  Will switch Protonix to po.  She has actually done well with perf ulcer.  Her wound is open and clean.  Apparently the plan is for the family to take her home.  I did not see anyone from the family over the weekend to discuss a plan.  2.  HTN 3.  Dementia 4.  Leukocytosis]  WBC - 15,400 - 05/20/2015  Vanc/Azactam - 8/26 >>> 5.  Right pleural effusion - followed by Dr. Joya Gaskins (who has signed off)  6. DVT prophylaxis - SQ heparin 7.  Chronic leg pain  She was given NSAID's for this prior to her perforation.  This is going to be a chronic problem.  Currently getting Ultram. 8.  Disposition - she is getting close to be discharged.  I assume she will need SNF.  Social services aware and working on plans 9. Has foley.  This has probably been in for most of her hospitalization.  But she did not appear to have one prior to admission.   Will try it out and see how she does. 10. Anemia - Hgb - 8.7 - 05/20/2015   Principal Problem:   Gastric ulcer with perforation s/p open omental patch 05/11/2015 Active Problems:   Acute blood loss anemia   Essential hypertension   KNEE PAIN, BILATERAL   Anxiety and depression   Severe protein-calorie malnutrition   Alzheimer's disease   Pleural effusion   Asthmatic bronchitis   Palliative care encounter   Dyspnea   Wheezing   Subjective:  Alert and complains of leg pain.  But otherwise, pleasantly demented.  Objective:   Filed Vitals:   05/21/15 0608  BP: 130/60  Pulse: 106  Temp: 99.1 F (37.3 C)  Resp: 21     Intake/Output from previous day:  08/27 0701 - 08/28 0700 In: 910 [P.O.:560; IV Piggyback:350] Out: 2350 [Urine:2350]  Intake/Output this shift:      Physical Exam:   General: Older AA F who is alert.    HEENT: Normal. Pupils equal. .   Lungs:  Clear and equal.   Abdomen: Soft.  BS present   Wound: Her midline incision looks good.   Lab Results:     Recent Labs  05/19/15 0502 05/20/15 0500  WBC 18.2* 15.4*  HGB 9.5* 8.7*  HCT 27.6* 25.4*  PLT 347 343    BMET    Recent Labs  05/19/15 0502 05/20/15 0500  NA 140 137  K 4.2 4.6  CL 112* 110  CO2 23 24  GLUCOSE 94 72  BUN 11 11  CREATININE 0.86 0.82  CALCIUM 7.6* 7.4*    PT/INR    Recent Labs  05/19/15 0820  LABPROT 18.0*  INR 1.48    ABG  No results for input(s): PHART, HCO3 in the last 72 hours.  Invalid input(s): PCO2, PO2   Studies/Results:  Dg Chest Port 1 View  05/19/2015   CLINICAL DATA:  81 year old female with right pleural effusion -status post thoracentesis.  EXAM: PORTABLE CHEST - 1 VIEW  COMPARISON:  05/19/2015 and prior exams  FINDINGS: Right lower lung atelectasis and probable right pleural effusion noted.  There is no evidence of pneumothorax.  Cardiomegaly and right PICC line with tip overlying the superior cavoatrial junction again noted.  Mild left basilar atelectasis is noted.  IMPRESSION: Little significant  change in appearance of the chest. No evidence of pneumothorax identified on the study.  Bilateral lower lung atelectasis, right greater than left, and probable right pleural effusion again noted.   Electronically Signed   By: Margarette Canada M.D.   On: 05/19/2015 15:31   Dg Chest Port 1 View  05/19/2015   CLINICAL DATA:  Status post thoracentesis right side. History of asthma, hypertension, pneumonia, left mastectomy.  EXAM: PORTABLE CHEST - 1 VIEW  COMPARISON:  05/18/2015  FINDINGS: Right-sided PICC line tip overlies the level of the lower superior vena cava, unchanged. There continues be dense opacification at the right lung base obscuring the hemidiaphragm. Horizontal level of the pleural effusion raises the question of air within the pleural space although no definite pneumothorax can be identified. There is persistent opacity in the  left lung base as well. EKG lead overlies the left lung apex. Residual contrast is seen the colon.  IMPRESSION: 1. Status post right pneumothorax scratched a status post right thoracentesis. 2. Question of small right pneumothorax.  Follow-up is recommended. The salient findings were discussed with Noe Gens on 05/19/2015 at 1:41 pm.   Electronically Signed   By: Nolon Nations M.D.   On: 05/19/2015 13:41     Anti-infectives:   Anti-infectives    Start     Dose/Rate Route Frequency Ordered Stop   05/19/15 2200  vancomycin (VANCOCIN) 500 mg in sodium chloride 0.9 % 100 mL IVPB     500 mg 100 mL/hr over 60 Minutes Intravenous Every 12 hours 05/19/15 0934     05/19/15 1100  vancomycin (VANCOCIN) IVPB 1000 mg/200 mL premix     1,000 mg 200 mL/hr over 60 Minutes Intravenous  Once 05/19/15 0934 05/19/15 1200   05/19/15 1000  aztreonam (AZACTAM) 1 g in dextrose 5 % 50 mL IVPB     1 g 100 mL/hr over 30 Minutes Intravenous Every 8 hours 05/19/15 0856     05/13/15 0600  ciprofloxacin (CIPRO) IVPB 400 mg     400 mg 200 mL/hr over 60 Minutes Intravenous Every 24 hours 05/12/15 0757 05/19/15 0734   05/12/15 0800  metroNIDAZOLE (FLAGYL) IVPB 500 mg     500 mg 100 mL/hr over 60 Minutes Intravenous Every 8 hours 05/12/15 0725 05/19/15 0759   05/12/15 0730  ciprofloxacin (CIPRO) IVPB 400 mg  Status:  Discontinued     400 mg 200 mL/hr over 60 Minutes Intravenous Every 12 hours 05/12/15 0725 05/12/15 0756   05/12/15 0600  ciprofloxacin (CIPRO) IVPB 400 mg     400 mg 200 mL/hr over 60 Minutes Intravenous Every 24 hours 05/11/15 1118 05/12/15 0648   05/11/15 1100  metroNIDAZOLE (FLAGYL) IVPB 500 mg     500 mg 100 mL/hr over 60 Minutes Intravenous Every 8 hours 05/11/15 1036 05/11/15 1227   05/11/15 1045  ciprofloxacin (CIPRO) IVPB 400 mg  Status:  Discontinued     400 mg 200 mL/hr over 60 Minutes Intravenous Every 12 hours 05/11/15 1036 05/11/15 1117   05/11/15 0515  ciprofloxacin (CIPRO) IVPB 400  mg     400 mg 200 mL/hr over 60 Minutes Intravenous  Once 05/11/15 0503 05/11/15 0656   05/11/15 0515  metroNIDAZOLE (FLAGYL) IVPB 500 mg     500 mg 100 mL/hr over 60 Minutes Intravenous  Once 05/11/15 0503 05/11/15 0619      Alphonsa Overall, MD, FACS Pager: [facility] Office: [phone] 05/21/2015

## 2015-05-21 NOTE — Progress Notes (Signed)
[facility] Consult progress note                                                                               Patient Demographics  Elizabeth Peterson, is a 81 y.o. female, DOB - May 08, 1919, EHU:[id number]  Admit date - 05/11/2015   Admitting Physician Leighton Ruff, MD  Outpatient Primary MD for the patient is REED, TIFFANY, DO  LOS - 10   Chief Complaint  Patient presents with  . Abdominal Pain       Brief HPI   Patient is a 81 year old female with hypertension, hyperlipidemia, dementia (confirmed with granddaughter at the bedside), GERD, anemi, wheelchair-bound due to osteoarthritis a had originally presented on 8/18 to the ED with moderate generalized abdominal pain for several weeks which worsened her day before the admission. She also had associated nausea and vomiting on the day of admission. Patient had significant tenderness with rebound and guarding and was found to have perforated gastric ulcer. Patient underwent laparoscopic repair of the gastric ulcer and an omental patch. Postoperative care was managed by critical care service, postop period was complicated by ongoing hypotension, acute kidney injury, worsening mental status superimposed on dementia. She was also noticed to have small pleural effusion with wheezing.  Palliative medicine was also consulted for goals of care. Internal medicine consulted today for medical comanagement.    Assessment & Plan    Active Problems: Dyspnea with pulmonary edema- shortness of breath has significantly improved - Continue oral lasix for now, DC lasix upon discharge  - Swallow evaluation was done, patient placed on dysphagia 3 diet today by surgery  Right-sided pleural effusion, leukocytosis, possible aspiration pneumonia - On vancomycin and aztreonam (patient is allergic to penicillin) - Blood cultures negative so far, thoracentesis done on 8/26, studies consistent with transudative effusion - No  intra-abdominal abscess seen on the CT abdomen - Chest x-ray showed persistent bilateral infiltrates right greater than left, possibly has underlying aspiration  - I have transitioned to oral levaquin, for 7 days at discharge. CBC for today is pending.   - ID is also following   Gastric ulcer with perforation and open repair - Management per the primary team  - continue IV PPI  Altered mental status with underlying Alzheimer's dementia- overall mental status is  improving  - UA negative for any UTI, B12, folate, TSH all normal. Did not obtain CT head as mental status is improving.    Anemia Postop H&H is stable  Hypokalemia On daily replacement while on lasix, DC potassium replacement upon discharge   Hypoglycemia: Likely due to poor PO intake  disposition Plan: Per surgery, stable from medical stand point   Time Spent in minutes 19mins   Procedures  Chest x-ray CT abd  DVT Prophylaxis  heparin subcutaneous  Medications  Scheduled Meds: . albuterol  2.5 mg Nebulization Q12H  . antiseptic oral rinse  7 mL Mouth Rinse q12n4p  . chlorhexidine  15 mL Mouth Rinse BID  . furosemide  40 mg Oral Daily  . heparin  5,000 Units Subcutaneous 3 times per day  . Influenza vac split quadrivalent PF  0.5 mL Intramuscular  Tomorrow-1000  . levofloxacin  750 mg Oral Daily  . mirtazapine  15 mg Oral QHS  . pantoprazole (PROTONIX) IV  80 mg Intravenous Q12H  . potassium chloride  20 mEq Oral BID WC   Continuous Infusions:  PRN Meds:.acetaminophen, albuterol, fentaNYL (SUBLIMAZE) injection, iohexol, ondansetron **OR** ondansetron (ZOFRAN) IV, sodium chloride, traMADol   Antibiotics   Anti-infectives    Start     Dose/Rate Route Frequency Ordered Stop   05/22/15 1000  vancomycin (VANCOCIN) IVPB 750 mg/150 ml premix  Status:  Discontinued     750 mg 150 mL/hr over 60 Minutes Intravenous Every 24 hours 05/21/15 1051 05/21/15 1103   05/21/15 1115  levofloxacin (LEVAQUIN) tablet 750 mg       750 mg Oral Daily 05/21/15 1104     05/19/15 2200  vancomycin (VANCOCIN) 500 mg in sodium chloride 0.9 % 100 mL IVPB  Status:  Discontinued     500 mg 100 mL/hr over 60 Minutes Intravenous Every 12 hours 05/19/15 0934 05/21/15 1051   05/19/15 1100  vancomycin (VANCOCIN) IVPB 1000 mg/200 mL premix     1,000 mg 200 mL/hr over 60 Minutes Intravenous  Once 05/19/15 0934 05/19/15 1200   05/19/15 1000  aztreonam (AZACTAM) 1 g in dextrose 5 % 50 mL IVPB  Status:  Discontinued     1 g 100 mL/hr over 30 Minutes Intravenous Every 8 hours 05/19/15 0856 05/21/15 1103   05/13/15 0600  ciprofloxacin (CIPRO) IVPB 400 mg     400 mg 200 mL/hr over 60 Minutes Intravenous Every 24 hours 05/12/15 0757 05/19/15 0734   05/12/15 0800  metroNIDAZOLE (FLAGYL) IVPB 500 mg     500 mg 100 mL/hr over 60 Minutes Intravenous Every 8 hours 05/12/15 0725 05/19/15 0759   05/12/15 0730  ciprofloxacin (CIPRO) IVPB 400 mg  Status:  Discontinued     400 mg 200 mL/hr over 60 Minutes Intravenous Every 12 hours 05/12/15 0725 05/12/15 0756   05/12/15 0600  ciprofloxacin (CIPRO) IVPB 400 mg     400 mg 200 mL/hr over 60 Minutes Intravenous Every 24 hours 05/11/15 1118 05/12/15 0648   05/11/15 1100  metroNIDAZOLE (FLAGYL) IVPB 500 mg     500 mg 100 mL/hr over 60 Minutes Intravenous Every 8 hours 05/11/15 1036 05/11/15 1227   05/11/15 1045  ciprofloxacin (CIPRO) IVPB 400 mg  Status:  Discontinued     400 mg 200 mL/hr over 60 Minutes Intravenous Every 12 hours 05/11/15 1036 05/11/15 1117   05/11/15 0515  ciprofloxacin (CIPRO) IVPB 400 mg     400 mg 200 mL/hr over 60 Minutes Intravenous  Once 05/11/15 0503 05/11/15 0656   05/11/15 0515  metroNIDAZOLE (FLAGYL) IVPB 500 mg     500 mg 100 mL/hr over 60 Minutes Intravenous  Once 05/11/15 0503 05/11/15 6063        Subjective:   Elizabeth Peterson was seen and examined. No complaints, no shortness of breath. Low-grade temp of 99.1.  Patient denies dizziness, chest pain, N/V,  new weakness, numbess, tingling. No acute events overnight.   Objective:   Blood pressure 130/60, pulse 106, temperature 99.1 F (37.3 C), temperature source Oral, resp. rate 21, height 5\' 3"  (1.6 m), weight 53.3 kg (117 lb 8.1 oz), SpO2 96 %.  Wt Readings from Last 3 Encounters:  05/12/15 53.3 kg (117 lb 8.1 oz)  12/06/13 50.44 kg (111 lb 3.2 oz)  09/06/13 48.988 kg (108 lb)     Intake/Output Summary (Last 24 hours) at 05/21/15  [place] filed at 05/21/15 0900  Gross per 24 hour  Intake    830 ml  Output   2175 ml  Net  -1345 ml    Exam  General:  Awake, NAD, pleasantly confused, oriented to self   HEENT:  PERRLA, EOMI  Neck: Supple, no JVD  CVS: S1 S2 clear RRR   Respiratory:  Decreased breath sounds at the bases  Abdomen: Soft, dressing intact  Ext: no cyanosis clubbing or edema  Neuro: no new deficits  Skin: No rashes  Psych: awake and oriented x2   Data Review   Micro Results Recent Results (from the past 240 hour(s))  Culture, blood (routine x 2)     Status: None   Collection Time: 05/15/15 10:15 PM  Result Value Ref Range Status   Specimen Description BLOOD LEFT ANTECUBITAL  Final   Special Requests IN PEDIATRIC BOTTLE [place]  Final   Culture   Final    NO GROWTH 5 DAYS Performed at [facility]    Report Status 05/20/2015 FINAL  Final  Culture, blood (routine x 2)     Status: None (Preliminary result)   Collection Time: 05/16/15  7:05 AM  Result Value Ref Range Status   Specimen Description BLOOD RIGHT ARM  Final   Special Requests IN PEDIATRIC BOTTLE 3CC  Final   Culture   Final    NO GROWTH 4 DAYS Performed at [facility]    Report Status PENDING  Incomplete  Culture, Urine     Status: None   Collection Time: 05/19/15 12:08 AM  Result Value Ref Range Status   Specimen Description URINE, RANDOM  Final   Special Requests NONE  Final   Culture   Final    50,000 COLONIES/mL YEAST Performed at [facility]     Report Status 05/20/2015 FINAL  Final  Body fluid culture     Status: None (Preliminary result)   Collection Time: 05/19/15 11:56 AM  Result Value Ref Range Status   Specimen Description PLEURAL  Final   Special Requests NONE  Final   Gram Stain   Final    MODERATE WBC PRESENT, PREDOMINANTLY MONONUCLEAR NO ORGANISMS SEEN    Culture   Final    NO GROWTH 2 DAYS Performed at [facility]    Report Status PENDING  Incomplete  Fungal stain     Status: None   Collection Time: 05/19/15 11:56 AM  Result Value Ref Range Status   Specimen Description PLEURAL  Final   Special Requests NONE  Final   Fungal Smear   Final    NO YEAST OR FUNGAL ELEMENTS SEEN Performed at [facility]    Report Status 05/20/2015 FINAL  Final    Radiology Reports Ct Abdomen Pelvis Wo Contrast  05/18/2015   CLINICAL DATA:  Leukocytosis. Laparoscopic gastric ulcer surgery 05/11/2015 with persistent abdominal pain.  EXAM: CT ABDOMEN AND PELVIS WITHOUT CONTRAST  TECHNIQUE: Multidetector CT imaging of the abdomen and pelvis was performed following the standard protocol without IV contrast.  COMPARISON:  05/11/2015  FINDINGS: Lung bases demonstrate worsening moderate size right effusion and small left effusion with associated compressive atelectasis in the right base. Mild stable cardiomegaly with atherosclerotic coronary artery disease unchanged. Central venous line seen with tip over the cavoatrial junction.  Abdominal images demonstrate the stomach to be mildly distended with air and minimal wall thickening over the distal stomach/ proximal duodenum likely postsurgical. This mild wall thickening/ edema could  be causing a degree of obstruction. No evidence of free peritoneal air. Small amount of perihepatic fluid. Minimal density just below the splenic flexure likely postsurgical.  There are a couple sub cm stable liver hypodensities likely cysts. Suggestion mild contrast excretion within the gallbladder. The  spleen, pancreas and adrenal glands are within normal. Kidneys normal in size without nephrolithiasis. There is subtle symmetric prominence of the intrarenal collecting systems. Appendix not seen.  Contrast and air are present throughout the colon. There is mild diverticulosis over the sigmoid colon. There are several minimally dilated small bowel loops likely postoperative ileus.  Abdominal wall incision anteriorly in the midline. Bilateral subcutaneous edema over the abdominal wall and pelvic soft tissues.  Calcified plaque of the abdominal aorta and iliac arteries.  Pelvic images demonstrate a Foley catheter present within a decompressed bladder. There is mild fecal retention over the rectum. There is a small amount of free pelvic fluid present.  There are degenerative changes of the spine with multilevel disc disease over the lumbar spine and subtle grade 1 anterolisthesis of L3 on L4 and L4 on L5 unchanged. Mild degenerate change of the hips.  IMPRESSION: Mild gaseous distention of the stomach with minimal wall thickening over the distal stomach/ proximal duodenum likely postsurgical edema and may be causing a mild degree of obstruction. Several mildly dilated small bowel loops likely postoperative ileus. Small amount of peritoneal fluid likely postsurgical. No free peritoneal air.  Worsening moderate size right effusion and small left effusion with associated compressive atelectasis in the right base.  Mild diverticulosis of the sigmoid colon.  Two small subcentimeter liver hypodensities unchanged likely cysts.  Stable cardiomegaly and atherosclerotic coronary artery disease.  Other stable chronic finding as described.   Electronically Signed   By: Marin Olp M.D.   On: 05/18/2015 14:25   Ct Abdomen Pelvis Wo Contrast  05/11/2015   CLINICAL DATA:  Generalized abdominal pain, worse last night.  EXAM: CT ABDOMEN AND PELVIS WITHOUT CONTRAST  TECHNIQUE: Multidetector CT imaging of the abdomen and pelvis was  performed following the standard protocol without IV contrast.  COMPARISON:  None.  FINDINGS: BODY WALL: No contributory findings.  LOWER CHEST: Small bilateral pleural effusion with right basilar atelectasis. Small lower pneumomediastinum through hiatal hernia.  ABDOMEN/PELVIS:  Liver: Few sub cm low densities favor cysts.  Biliary: Haziness of fat around the gallbladder is likely secondary. No primary inflammation suspected.  Pancreas: Unremarkable.  Spleen: Unremarkable.  Adrenals: Unremarkable.  Kidneys and ureters: Mild bilateral renal cortical atrophy. No hydronephrosis.  Bladder: Distended without wall thickening.  Reproductive: Hysterectomy.  No adnexal mass.  Bowel: There is a large volume of pneumoperitoneum distributed throughout the abdomen. Given the large volume, distribution is not helpful in identifying a source. Small volume ascites accompanies the fluid, with probable peritoneal thickening in the pelvic recesses. The source of perforation is uncertain; there is no definitive pneumatosis or ulceration. No inflammatory wall thickening around the gastric antrum or duodenum. The cecum is gas filled and ventrally rotated, but there is no bascule or volvulus and no associated pneumatosis. Oral contrast reaches the third portion duodenum and there is no contrast extravasation.  Vascular: No acute abnormality.  OSSEOUS: No acute abnormalities.  These results were called by telephone at the time of interpretation on 05/11/2015 at 5:02 am to Dr. Julianne Rice , who verbally acknowledged these results.  IMPRESSION: 1. Perforated bowel with large pneumoperitoneum and small ascites. The site of perforation is uncertain; no extravasation of oral contrast  which reaches the third portion duodenum. Colonic diverticulosis is present. 2. Atelectasis and small bilateral pleural effusion.   Electronically Signed   By: Monte Fantasia M.D.   On: 05/11/2015 05:07   Dg Chest 2 View  05/18/2015   CLINICAL DATA:   Leukocytosis. No chest complaints. History of hypertension, asthma, dementia.  EXAM: CHEST  2 VIEW  COMPARISON:  05/16/2015 and earlier  FINDINGS: Right-sided PICC line tip overlies the level of the right atrium. Right scratched of bilateral pleural effusions are present, right greater than left. Bilateral lung opacities obscure the hemidiaphragms bilaterally, right greater than left. Little change since prior study. Residual contrast identified in the colon. Cardiomegaly.  IMPRESSION: 1. Persistent bilateral infiltrates, right greater than left. 2. Bilateral pleural effusions.   Electronically Signed   By: Nolon Nations M.D.   On: 05/18/2015 13:47   Dg Chest Port 1 View  05/19/2015   CLINICAL DATA:  81 year old female with right pleural effusion -status post thoracentesis.  EXAM: PORTABLE CHEST - 1 VIEW  COMPARISON:  05/19/2015 and prior exams  FINDINGS: Right lower lung atelectasis and probable right pleural effusion noted.  There is no evidence of pneumothorax.  Cardiomegaly and right PICC line with tip overlying the superior cavoatrial junction again noted.  Mild left basilar atelectasis is noted.  IMPRESSION: Little significant change in appearance of the chest. No evidence of pneumothorax identified on the study.  Bilateral lower lung atelectasis, right greater than left, and probable right pleural effusion again noted.   Electronically Signed   By: Margarette Canada M.D.   On: 05/19/2015 15:31   Dg Chest Port 1 View  05/19/2015   CLINICAL DATA:  Status post thoracentesis right side. History of asthma, hypertension, pneumonia, left mastectomy.  EXAM: PORTABLE CHEST - 1 VIEW  COMPARISON:  05/18/2015  FINDINGS: Right-sided PICC line tip overlies the level of the lower superior vena cava, unchanged. There continues be dense opacification at the right lung base obscuring the hemidiaphragm. Horizontal level of the pleural effusion raises the question of air within the pleural space although no definite  pneumothorax can be identified. There is persistent opacity in the left lung base as well. EKG lead overlies the left lung apex. Residual contrast is seen the colon.  IMPRESSION: 1. Status post right pneumothorax scratched a status post right thoracentesis. 2. Question of small right pneumothorax.  Follow-up is recommended. The salient findings were discussed with Noe Gens on 05/19/2015 at 1:41 pm.   Electronically Signed   By: Nolon Nations M.D.   On: 05/19/2015 13:41   Dg Chest Port 1 View  05/16/2015   CLINICAL DATA:  Increasing short of breath  EXAM: PORTABLE CHEST - 1 VIEW  COMPARISON:  05/14/2015  FINDINGS: The heart is moderately enlarged. Pulmonary edema has developed. Opacity at the right base is stable likely a combination of volume loss, consolidation, and pleural fluid. Atelectasis at the left base is stable. Tiny left pleural effusion is not excluded.  IMPRESSION: New onset of pulmonary edema. Bibasilar atelectasis and right pleural effusion are stable.   Electronically Signed   By: Marybelle Killings M.D.   On: 05/16/2015 08:19   Dg Chest Port 1 View  05/14/2015   CLINICAL DATA:  Shortness of breath  EXAM: PORTABLE CHEST - 1 VIEW  COMPARISON:  05/12/2015  FINDINGS: Nasogastric tube tip has been removed. Dilated colon again seen in the left abdomen.  Bilateral basilar opacification, greater on the right. Appearance suggests associated pleural fluid. Unchanged cardiopericardial  enlargement. Stable upper mediastinal contours.  IMPRESSION: Stable bibasilar lung opacity, greater on the right. Admission abdominal CT favors pleural fluid and atelectasis.   Electronically Signed   By: Monte Fantasia M.D.   On: 05/14/2015 06:14   Dg Chest Port 1 View  05/12/2015   CLINICAL DATA:  Respiratory failure.  EXAM: PORTABLE CHEST - 1 VIEW  COMPARISON:  05/11/2015.  FINDINGS: NG tube noted coiled in stomach. Mediastinum hilar structures are normal. Heart size stable. Right lower lobe infiltrate consistent  pneumonia. Right pleural effusion. Subsegmental atelectasis left lung base. No pneumothorax. Surgical clip left upper abdomen.  IMPRESSION: 1.  NG tube noted coiled in stomach.  2. Right lower lobe infiltrate consistent pneumonia. Moderate right pleural effusion.   Electronically Signed   By: Marcello Moores  Register   On: 05/12/2015 07:10   Dg Chest Port 1 View  05/11/2015   CLINICAL DATA:  Hypoxia  EXAM: PORTABLE CHEST - 1 VIEW  COMPARISON:  02/28/2013  FINDINGS: Normal heart size and aortic contours.  Atelectasis in the right upper lobe seen on the previous study has resolved. There is an indistinct opacity at the right base obscuring the diaphragm which is elevated compared to prior. No edema, effusion, or air leak.  IMPRESSION: Hazy opacity at the right base favoring atelectasis. This area should be seen on abdominal CT scheduled for later today.   Electronically Signed   By: Monte Fantasia M.D.   On: 05/11/2015 03:41   Dg Duanne Limerick W/water Sol Cm  05/16/2015   CLINICAL DATA:  Patient status post repair per stroke perforated gastric ulcer with omental patch.  EXAM: WATER SOLUBLE UPPER GI SERIES  TECHNIQUE: Single-column upper GI series was performed using water soluble contrast.  CONTRAST:  19mL OMNIPAQUE IOHEXOL 300 MG/ML  SOLN  COMPARISON:  None.  FLUOROSCOPY TIME:  Radiation Exposure Index (as provided by the fluoroscopic device):  If the device does not provide the exposure index:  Fluoroscopy Time (in minutes and seconds):  1 minutes 52 seconds  Number of Acquired Images:  8  FINDINGS: Exam is suboptimal due to limited intake of the oral contrast as well as limited patient mobility. The contrast does flow easily into the stomach and ultimately passes into the first and second second portion of the duodenum. Stomach is gas-filled. No evidence of leak of the oral contrast.  IMPRESSION: No evidence of leak of oral contrast from the stomach on limited exam.   Electronically Signed   By: Suzy Bouchard M.D.   On:  05/16/2015 15:04    CBC  Recent Labs Lab 05/16/15 0705 05/17/15 0513 05/18/15 0605 05/19/15 0502 05/20/15 0500  WBC 11.7* 12.8* 14.9* 18.2* 15.4*  HGB 7.9* 7.0* 8.6* 9.5* 8.7*  HCT 23.6* 21.2* 25.1* 27.6* 25.4*  PLT 266 268 278 347 343  MCV 91.8 91.8 90.0 89.6 91.0  MCH 30.7 30.3 30.8 30.8 31.2  MCHC 33.5 33.0 34.3 34.4 34.3  RDW 15.2 15.3 14.9 14.9 15.4    Chemistries   Recent Labs Lab 05/16/15 0705 05/17/15 0513 05/18/15 0605 05/19/15 0502 05/20/15 0500  NA 144 145 141 140 137  K 3.2* 3.1* 3.0* 4.2 4.6  CL 120* 119* 115* 112* 110  CO2 15* 18* 21* 23 24  GLUCOSE 111* 83 88 94 72  BUN 13 13 11 11 11   CREATININE 1.04* 1.02* 1.04* 0.86 0.82  CALCIUM 7.5* 7.3* 7.1* 7.6* 7.4*  MG  --  1.3* 1.6*  --   --    ------------------------------------------------------------------------------------------------------------------  estimated creatinine clearance is 34.7 mL/min (by C-G formula based on Cr of 0.82). ------------------------------------------------------------------------------------------------------------------ No results for input(s): HGBA1C in the last 72 hours. ------------------------------------------------------------------------------------------------------------------ No results for input(s): CHOL, HDL, LDLCALC, TRIG, CHOLHDL, LDLDIRECT in the last 72 hours. ------------------------------------------------------------------------------------------------------------------ No results for input(s): TSH, T4TOTAL, T3FREE, THYROIDAB in the last 72 hours.  Invalid input(s): FREET3 ------------------------------------------------------------------------------------------------------------------ No results for input(s): VITAMINB12, FOLATE, FERRITIN, TIBC, IRON, RETICCTPCT in the last 72 hours.  Coagulation profile  Recent Labs Lab 05/19/15 0820  INR 1.48    No results for input(s): DDIMER in the last 72 hours.  Cardiac Enzymes No results for input(s):  CKMB, TROPONINI, MYOGLOBIN in the last 168 hours.  Invalid input(s): CK ------------------------------------------------------------------------------------------------------------------ Invalid input(s): [place]  05/20/15 0923 05/20/15 1200 05/20/15 1708 05/20/15 2240 05/21/15 0835 05/21/15 0926  GLUCAP 72 110* 107* 84 1 70     Tibor Lemmons M.D. [facility] 05/21/2015, 11:04 AM  Pager: [phone] Between 7am to 7pm - call Pager - [phone]  After 7pm go to [email or web address] - password [email or web address]  Call night coverage person covering after 7pm

## 2015-05-21 NOTE — Progress Notes (Signed)
[place] for Vancomycin & Aztreonam Indication: pneumonia  Allergies  Allergen Reactions  . Nsaids Other (See Comments)    Perforated gastric ulcer  . Amlodipine Besylate     REACTION: rash and itch  . Benzodiazepines Other (See Comments)    Hx of overuse/memory issue  . Penicillins   . Sulfonamide Derivatives    Patient Measurements: Height: 5\' 3"  (160 cm) Weight: 117 lb 8.1 oz (53.3 kg) IBW/kg (Calculated) : 52.4  Vital Signs: Temp: 99.1 F (37.3 C) (08/28 0608) Temp Source: Oral (08/28 2979) BP: 130/60 mmHg (08/28 8921) Pulse Rate: 106 (08/28 0608) Intake/Output from previous day: 08/27 0701 - 08/28 0700 In: 910 [P.O.:560; IV Piggyback:350] Out: 2350 [Urine:2350] Intake/Output from this shift: Total I/O In: 120 [P.O.:120] Out: -   Labs:  Recent Labs  05/19/15 0502 05/20/15 0500  WBC 18.2* 15.4*  HGB 9.5* 8.7*  PLT 347 343  CREATININE 0.86 0.82   Estimated Creatinine Clearance: 34.7 mL/min (by C-G formula based on Cr of 0.82).  Recent Labs  05/21/15 0925  [place] 21*    Microbiology: Recent Results (from the past 720 hour(s))  MRSA PCR Screening     Status: None   Collection Time: 05/11/15  9:55 AM  Result Value Ref Range Status   MRSA by PCR NEGATIVE NEGATIVE Final    Comment:        The GeneXpert MRSA Assay (FDA approved for NASAL specimens only), is one component of a comprehensive MRSA colonization surveillance program. It is not intended to diagnose MRSA infection nor to guide or monitor treatment for MRSA infections.   Culture, blood (routine x 2)     Status: None   Collection Time: 05/15/15 10:15 PM  Result Value Ref Range Status   Specimen Description BLOOD LEFT ANTECUBITAL  Final   Special Requests IN PEDIATRIC BOTTLE [place]  Final   Culture   Final    NO GROWTH 5 DAYS Performed at [facility]    Report Status 05/20/2015 FINAL  Final  Culture, blood (routine x 2)     Status: None  (Preliminary result)   Collection Time: 05/16/15  7:05 AM  Result Value Ref Range Status   Specimen Description BLOOD RIGHT ARM  Final   Special Requests IN PEDIATRIC BOTTLE 3CC  Final   Culture   Final    NO GROWTH 4 DAYS Performed at [facility]    Report Status PENDING  Incomplete  Culture, Urine     Status: None   Collection Time: 05/19/15 12:08 AM  Result Value Ref Range Status   Specimen Description URINE, RANDOM  Final   Special Requests NONE  Final   Culture   Final    50,000 COLONIES/mL YEAST Performed at [facility]    Report Status 05/20/2015 FINAL  Final  Body fluid culture     Status: None (Preliminary result)   Collection Time: 05/19/15 11:56 AM  Result Value Ref Range Status   Specimen Description PLEURAL  Final   Special Requests NONE  Final   Gram Stain   Final    MODERATE WBC PRESENT, PREDOMINANTLY MONONUCLEAR NO ORGANISMS SEEN    Culture   Final    NO GROWTH 2 DAYS Performed at [facility]    Report Status PENDING  Incomplete  Fungal stain     Status: None   Collection Time: 05/19/15 11:56 AM  Result Value Ref Range Status   Specimen Description PLEURAL  Final   Special Requests NONE  Final   Fungal Smear   Final    NO YEAST OR FUNGAL ELEMENTS SEEN Performed at [facility]    Report Status 05/20/2015 FINAL  Final   Medical History: Past Medical History  Diagnosis Date  . HYPERLIPIDEMIA 04/16/2007  . ANEMIA-NOS 04/16/2007  . ANXIETY 04/16/2007  . DEPRESSION 09/03/2007  . HYPERTENSION 04/16/2007  . ATHEROSLERO NATV ART EXTREM W/INTERMIT CLAUDICAT 06/05/2010  . ALLERGIC RHINITIS 02/27/2009  . PNEUMONIA, COMMUNITY ACQUIRED, PNEUMOCOCCAL 12/07/2008  . ASTHMA 09/03/2007  . GERD 04/16/2007  . DIVERTICULOSIS, COLON 09/03/2007  . MENOPAUSAL DISORDER 04/18/2008  . OSTEOARTHRITIS, KNEE, LEFT 11/24/2009  . JOINT EFFUSION, LEFT KNEE 02/27/2009  . KNEE PAIN, BILATERAL 06/01/2010  . LOW BACK PAIN 04/16/2007  . LEG PAIN, LEFT 06/01/2010   . WEIGHT LOSS 07/25/2008  . RASH-NONVESICULAR 07/25/2008  . Abdominal pain, generalized 09/03/2007  . BREAST CANCER, HX OF 04/16/2007  . COLONIC POLYPS, HX OF 09/03/2007  . Dementia 06/17/2012    mild  . Peripheral neuropathy 06/17/2012   Medications:  Scheduled:  . albuterol  2.5 mg Nebulization Q12H  . antiseptic oral rinse  7 mL Mouth Rinse q12n4p  . aztreonam  1 g Intravenous Q8H  . chlorhexidine  15 mL Mouth Rinse BID  . furosemide  40 mg Intravenous Daily  . heparin  5,000 Units Subcutaneous 3 times per day  . Influenza vac split quadrivalent PF  0.5 mL Intramuscular Tomorrow-1000  . mirtazapine  15 mg Oral QHS  . pantoprazole (PROTONIX) IV  80 mg Intravenous Q12H  . potassium chloride  20 mEq Oral BID WC  . vancomycin  500 mg Intravenous Q12H   Anti-infectives    Start     Dose/Rate Route Frequency Ordered Stop   05/19/15 2200  vancomycin (VANCOCIN) 500 mg in sodium chloride 0.9 % 100 mL IVPB     500 mg 100 mL/hr over 60 Minutes Intravenous Every 12 hours 05/19/15 0934     05/19/15 1100  vancomycin (VANCOCIN) IVPB 1000 mg/200 mL premix     1,000 mg 200 mL/hr over 60 Minutes Intravenous  Once 05/19/15 0934 05/19/15 1200   05/19/15 1000  aztreonam (AZACTAM) 1 g in dextrose 5 % 50 mL IVPB     1 g 100 mL/hr over 30 Minutes Intravenous Every 8 hours 05/19/15 0856     05/13/15 0600  ciprofloxacin (CIPRO) IVPB 400 mg     400 mg 200 mL/hr over 60 Minutes Intravenous Every 24 hours 05/12/15 0757 05/19/15 0734   05/12/15 0800  metroNIDAZOLE (FLAGYL) IVPB 500 mg     500 mg 100 mL/hr over 60 Minutes Intravenous Every 8 hours 05/12/15 0725 05/19/15 0759   05/12/15 0730  ciprofloxacin (CIPRO) IVPB 400 mg  Status:  Discontinued     400 mg 200 mL/hr over 60 Minutes Intravenous Every 12 hours 05/12/15 0725 05/12/15 0756   05/12/15 0600  ciprofloxacin (CIPRO) IVPB 400 mg     400 mg 200 mL/hr over 60 Minutes Intravenous Every 24 hours 05/11/15 1118 05/12/15 0648   05/11/15 1100   metroNIDAZOLE (FLAGYL) IVPB 500 mg     500 mg 100 mL/hr over 60 Minutes Intravenous Every 8 hours 05/11/15 1036 05/11/15 1227   05/11/15 1045  ciprofloxacin (CIPRO) IVPB 400 mg  Status:  Discontinued     400 mg 200 mL/hr over 60 Minutes Intravenous Every 12 hours 05/11/15 1036 05/11/15 1117   05/11/15 0515  ciprofloxacin (CIPRO) IVPB 400 mg  400 mg 200 mL/hr over 60 Minutes Intravenous  Once 05/11/15 0503 05/11/15 0656   05/11/15 0515  metroNIDAZOLE (FLAGYL) IVPB 500 mg     500 mg 100 mL/hr over 60 Minutes Intravenous  Once 05/11/15 0503 05/11/15 0619     Assessment: 89 yoF to ED 8/18 with hx of several weeks abd pain, worsened night before admission. Perforated viscus on CT, diagnostic lap to open repair gastric ulcer with omental patch.  - CT 8/25: worsening moderate R effusion, small L effusion. No abd abscess, free air. Possible thoracentesis needed (8/24 lung U/S with small effusion, unable to drain).  - completed 7 days Cipro/Flagyl. Begin Vancomycin/Aztreonam for r/o HCAP on 8/26 - Day 11 abx: Day 3 Vancomycin 500mg  q12, Aztreonam 1gm q8. Vancomycin trough elevated this am at 21 mcg/ml. Only positive culture: urine with 50K yeast   Goal of Therapy:  Vancomycin trough level 15-20 mcg/ml  Plan:   No change to Aztreonam 1gm q8hr  Vancomycin to 750mg  q24 hr  Thank you,  Minda Ditto PharmD Pager [phone] 05/21/2015, 10:28 AM

## 2015-05-21 NOTE — Progress Notes (Signed)
Foley catheter d/c'd intact. 600 cc clear, straw urine drained. Pt tolerated well.

## 2015-05-21 NOTE — Progress Notes (Signed)
Redressed pt's abdominal wound again. Fourth dressing change of the shift because patient is confused and continues to remove the dressing. Mittens placed on hands. Door to room propped open. Pt reoriented to location and situation.

## 2015-05-21 NOTE — Progress Notes (Signed)
Hypoglycemic Event  CBG: 67 @ 1232  Treatment: 15 GM carbohydrate snack  Symptoms: None  Follow-up CBG: Time: 1307 CBG Result: 88  Possible Reasons for Event: Unknown  Comments/MD notified: Dr. Lucia Gaskins aware via phone -See new orders received regarding CBG's and foley status.    Derek Jack  Remember to initiate Hypoglycemia Order Set & complete

## 2015-05-21 NOTE — Care Management Note (Addendum)
Case Management Note  Patient Details  Name: Elizabeth Peterson MRN: [id number] Date of Birth: 1919/02/21  Subjective/Objective:                    Action/Plan: Discharge home with home health services vs. SNF  Expected Discharge Date:   (UNKNOWN)               Expected Discharge Plan:  [place] (vs SNF)  In-House Referral:  Clinical Social Work  Discharge planning Services  CM Consult  Post Acute Care Choice:  NA Choice offered to:  NA  DME Arranged:    DME Agency:     HH Arranged:    [place] Agency:     Status of Service:  In process, will continue to follow  Medicare Important Message Given:  Yes-third notification given Date Medicare IM Given:    Medicare IM give by:    Date Additional Medicare IM Given:    Additional Medicare Important Message give by:     If discussed at [place] of Stay Meetings, dates discussed:    Additional Comments: [name], [place] notified CM of family's plan to take patient home with home health services and the request for a hospital bed. CM went to patient's room to discuss needs with family. There was no family present. Attempted to reach the patient's granddaughter, Elizabeth Peterson via telephone. Unable to reach. Note placed on the chart requesting MD orders for home care and the hospital bed. CM will continue to follow.  [name], RN 05/21/2015, 1:57 PM

## 2015-05-22 LAB — BASIC METABOLIC PANEL
Anion gap: 7 (ref 5–15)
BUN: 13 mg/dL (ref 6–20)
CHLORIDE: 103 mmol/L (ref 101–111)
CO2: 27 mmol/L (ref 22–32)
Calcium: 7.6 mg/dL — ABNORMAL LOW (ref 8.9–10.3)
Creatinine, Ser: 0.78 mg/dL (ref 0.44–1.00)
GFR calc Af Amer: >60 mL/min (ref >60)
GFR calc non Af Amer: >60 mL/min (ref >60)
GLUCOSE: 65 mg/dL (ref 65–99)
POTASSIUM: 4.1 mmol/L (ref 3.5–5.1)
Sodium: 137 mmol/L (ref 135–145)

## 2015-05-22 LAB — CBC
HEMATOCRIT: 26.2 % — AB (ref 36.0–46.0)
HEMOGLOBIN: 8.2 g/dL — AB (ref 12.0–15.0)
MCH: 29.2 pg (ref 26.0–34.0)
MCHC: 31.3 g/dL (ref 30.0–36.0)
MCV: 93.2 fL (ref 78.0–100.0)
Platelets: 400 10*3/uL (ref 150–400)
RBC: 2.81 MIL/uL — AB (ref 3.87–5.11)
RDW: 15.2 % (ref 11.5–15.5)
WBC: 12.6 10*3/uL — ABNORMAL HIGH (ref 4.0–10.5)

## 2015-05-22 LAB — GLUCOSE, CAPILLARY: GLUCOSE-CAPILLARY: 85 mg/dL (ref 65–99)

## 2015-05-22 LAB — BODY FLUID CULTURE: CULTURE: NO GROWTH

## 2015-05-22 MED ORDER — TRAMADOL HCL 50 MG PO TABS: 50.0000 mg | ORAL_TABLET | Freq: Two times a day (BID) | ORAL | Status: DC | PRN

## 2015-05-22 MED ORDER — PANTOPRAZOLE SODIUM 40 MG PO TBEC
40.0000 mg | DELAYED_RELEASE_TABLET | Freq: Two times a day (BID) | ORAL | Status: DC
  Administered 2015-05-22 (×2): 40 mg via ORAL
  Filled 2015-05-22 (×2): qty 1

## 2015-05-22 MED ORDER — PANTOPRAZOLE SODIUM 40 MG PO TBEC: 40.0000 mg | DELAYED_RELEASE_TABLET | Freq: Two times a day (BID) | ORAL | Status: DC

## 2015-05-22 MED ORDER — POTASSIUM CHLORIDE CRYS ER 20 MEQ PO TBCR: 20.0000 meq | EXTENDED_RELEASE_TABLET | Freq: Every day | ORAL | Status: DC

## 2015-05-22 MED ORDER — FUROSEMIDE 40 MG PO TABS: 40.0000 mg | ORAL_TABLET | Freq: Every day | ORAL | Status: DC

## 2015-05-22 MED ORDER — LEVOFLOXACIN 750 MG PO TABS: 750.0000 mg | ORAL_TABLET | ORAL | Status: DC

## 2015-05-22 NOTE — Discharge Instructions (Signed)
AVOID NSAIDS (such as diclofenac, ibuprofen, naproxen, etc) TO PREVENT ANY MORE ULCERS    ABDOMINAL SURGERY: POST OP INSTRUCTIONS  1. DIET: Follow a light bland diet the first 24 hours after arrival home, such as soup, liquids, crackers, etc.  Be sure to include lots of fluids daily.  Avoid fast food or heavy meals as your are more likely to get nauseated.  Eat a low fat the next few days after surgery.   2. Take your usually prescribed home medications unless otherwise directed. 3. PAIN CONTROL: a. Pain is best controlled by a usual combination of three different methods TOGETHER: i. Ice/Heat ii. Over the counter pain medication iii. Prescription pain medication b. Most patients will experience some swelling and bruising around the incisions.  Ice packs or heating pads (30-60 minutes up to 6 times a day) will help. Use ice for the first few days to help decrease swelling and bruising, then switch to heat to help relax tight/sore spots and speed recovery.  Some people prefer to use ice alone, heat alone, alternating between ice & heat.  Experiment to what works for you.  Swelling and bruising can take several weeks to resolve.   c. It is helpful to take an over-the-counter pain medication regularly for the first few weeks.  Choose one of the following that works best for you: i. Naproxen (Aleve, etc)  Two 220mg  tabs twice a day ii. Ibuprofen (Advil, etc) Three 200mg  tabs four times a day (every meal & bedtime) iii. Acetaminophen (Tylenol, etc) 500-650mg  four times a day (every meal & bedtime) d. A  prescription for pain medication (such as oxycodone, hydrocodone, etc) should be given to you upon discharge.  Take your pain medication as prescribed.  i. If you are having problems/concerns with the prescription medicine (does not control pain, nausea, vomiting, rash, itching, etc), please call us [phone] to see if we need to switch you to a different pain medicine that will work better for you  and/or control your side effect better. ii. If you need a refill on your pain medication, please contact your pharmacy.  They will contact our office to request authorization. Prescriptions will not be filled after 5 pm or on week-ends. 4. Avoid getting constipated.  Between the surgery and the pain medications, it is common to experience some constipation.  Increasing fluid intake and taking a fiber supplement (such as Metamucil, Citrucel, FiberCon, MiraLax, etc) 1-2 times a day regularly will usually help prevent this problem from occurring.  A mild laxative (prune juice, Milk of Magnesia, MiraLax, etc) should be taken according to package directions if there are no bowel movements after 48 hours.   5. Watch out for diarrhea.  If you have many loose bowel movements, simplify your diet to bland foods & liquids for a few days.  Stop any stool softeners and decrease your fiber supplement.  Switching to mild anti-diarrheal medications (Kayopectate, Pepto Bismol) can help.  If this worsens or does not improve, please call us. 6. Wash / shower every day.  You may shower over the incision / wound.  Avoid baths until the skin is fully healed.  Continue to shower over incision(s) after the dressing is off. 7. Remove your waterproof bandages 5 days after surgery.  You may leave the incision open to air.  You may replace a dressing/Band-Aid to cover the incision for comfort if you wish. 8. ACTIVITIES as tolerated:   a. You may resume regular (light) daily activities beginning the  next day--such as daily self-care, walking, climbing stairs--gradually increasing activities as tolerated.  If you can walk 30 minutes without difficulty, it is safe to try more intense activity such as jogging, treadmill, bicycling, low-impact aerobics, swimming, etc. b. Save the most intensive and strenuous activity for last such as sit-ups, heavy lifting, contact sports, etc  Refrain from any heavy lifting or straining until you are off  narcotics for pain control.   c. DO NOT PUSH THROUGH PAIN.  Let pain be your guide: If it hurts to do something, don't do it.  Pain is your body warning you to avoid that activity for another week until the pain goes down. d. You may drive when you are no longer taking prescription pain medication, you can comfortably wear a seatbelt, and you can safely maneuver your car and apply brakes. e. Dennis Bast may have sexual intercourse when it is comfortable.  9. FOLLOW UP in our office a. Please call [facility] at [phone] to set up an appointment to see your surgeon in the office for a follow-up appointment approximately 1-2 weeks after your surgery. b. Make sure that you call for this appointment the day you arrive home to insure a convenient appointment time. 10. IF YOU HAVE DISABILITY OR FAMILY LEAVE FORMS, BRING THEM TO THE OFFICE FOR PROCESSING.  DO NOT GIVE THEM TO YOUR DOCTOR.   WHEN TO CALL us [phone]: 1. Poor pain control 2. Reactions / problems with new medications (rash/itching, nausea, etc)  3. Fever over 101.5 F (38.5 C) 4. Inability to urinate 5. Nausea and/or vomiting 6. Worsening swelling or bruising 7. Continued bleeding from incision. 8. Increased pain, redness, or drainage from the incision  The clinic staff is available to answer your questions during regular business hours (8:30am-5pm).  Please dont hesitate to call and ask to speak to one of our nurses for clinical concerns.   A surgeon from [facility] is always on call at the hospitals   If you have a medical emergency, go to the nearest emergency room or call 911.    [facility], [place], [place], [place], [place]  [address] ? MAIN: [phone] ? TOLL FREE: [phone] ? FAX [phone] [email or web address]  ABDOMINAL SURGERY: POST OP INSTRUCTIONS  10. DIET: Follow a light bland diet the first 24 hours after arrival home, such as soup, liquids,  crackers, etc.  Be sure to include lots of fluids daily.  Avoid fast food or heavy meals as your are more likely to get nauseated.  Eat a low fat the next few days after surgery.   11. Take your usually prescribed home medications unless otherwise directed. 12. PAIN CONTROL: a. Pain is best controlled by a usual combination of three different methods TOGETHER: i. Ice/Heat ii. Over the counter pain medication iii. Prescription pain medication b. Most patients will experience some swelling and bruising around the incisions.  Ice packs or heating pads (30-60 minutes up to 6 times a day) will help. Use ice for the first few days to help decrease swelling and bruising, then switch to heat to help relax tight/sore spots and speed recovery.  Some people prefer to use ice alone, heat alone, alternating between ice & heat.  Experiment to what works for you.  Swelling and bruising can take several weeks to resolve.   i. It is helpful to take an over-the-counter pain medication regularly for the first few weeks.  Choose acetaminophen (Tylenol, etc) 500-650mg  four  times a day (every meal & bedtime) c. A  prescription for pain medication (such as oxycodone, hydrocodone, etc) should be given to you upon discharge.  Take your pain medication as prescribed.  i. If you are having problems/concerns with the prescription medicine (does not control pain, nausea, vomiting, rash, itching, etc), please call us [phone] to see if we need to switch you to a different pain medicine that will work better for you and/or control your side effect better. ii. If you need a refill on your pain medication, please contact your pharmacy.  They will contact our office to request authorization. Prescriptions will not be filled after 5 pm or on week-ends. 13. Avoid getting constipated.  Between the surgery and the pain medications, it is common to experience some constipation.  Increasing fluid intake and taking a fiber supplement  (such as Metamucil, Citrucel, FiberCon, MiraLax, etc) 1-2 times a day regularly will usually help prevent this problem from occurring.  A mild laxative (prune juice, Milk of Magnesia, MiraLax, etc) should be taken according to package directions if there are no bowel movements after 48 hours.   14. Watch out for diarrhea.  If you have many loose bowel movements, simplify your diet to bland foods & liquids for a few days.  Stop any stool softeners and decrease your fiber supplement.  Switching to mild anti-diarrheal medications (Kayopectate, Pepto Bismol) can help.  If this worsens or does not improve, please call us. 15. Wash / shower every day.  You may shower over the incision / wound.  Avoid baths until the skin is fully healed.  Continue to shower over incision(s) after the dressing is off. 16. Remove your waterproof bandages 5 days after surgery.  You may leave the incision open to air.  You may replace a dressing/Band-Aid to cover the incision for comfort if you wish. 17. ACTIVITIES as tolerated:   a. You may resume regular (light) daily activities beginning the next day--such as daily self-care, walking, climbing stairs--gradually increasing activities as tolerated.  If you can walk 30 minutes without difficulty, it is safe to try more intense activity such as jogging, treadmill, bicycling, low-impact aerobics, swimming, etc. b. Save the most intensive and strenuous activity for last such as sit-ups, heavy lifting, contact sports, etc  Refrain from any heavy lifting or straining until you are off narcotics for pain control.   c. DO NOT PUSH THROUGH PAIN.  Let pain be your guide: If it hurts to do something, don't do it.  Pain is your body warning you to avoid that activity for another week until the pain goes down. d. You may drive when you are no longer taking prescription pain medication, you can comfortably wear a seatbelt, and you can safely maneuver your car and apply brakes. e. Dennis Bast may have  sexual intercourse when it is comfortable.  18. FOLLOW UP in our office a. Please call [facility] at [phone] to set up an appointment to see your surgeon in the office for a follow-up appointment approximately 1-2 weeks after your surgery. b. Make sure that you call for this appointment the day you arrive home to insure a convenient appointment time. 10. IF YOU HAVE DISABILITY OR FAMILY LEAVE FORMS, BRING THEM TO THE OFFICE FOR PROCESSING.  DO NOT GIVE THEM TO YOUR DOCTOR.   WHEN TO CALL us [phone]: 9. Poor pain control 10. Reactions / problems with new medications (rash/itching, nausea, etc)  11. Fever over 101.5 F (38.5 C)  12. Inability to urinate 13. Nausea and/or vomiting 14. Worsening swelling or bruising 15. Continued bleeding from incision. 16. Increased pain, redness, or drainage from the incision  The clinic staff is available to answer your questions during regular business hours (8:30am-5pm).  Please dont hesitate to call and ask to speak to one of our nurses for clinical concerns.   A surgeon from [facility] is always on call at the hospitals   If you have a medical emergency, go to the nearest emergency room or call 911.    [facility], [place], [place], [place], [place]  [address] ? MAIN: [phone]) [phone] ? TOLL FREE: [phone] ? FAX [phone] [email or web address]

## 2015-05-22 NOTE — Progress Notes (Signed)
Patient ID: Elizabeth Peterson, female   DOB: Jul 05, 1919, 81 y.o.   MRN: [id number]     [address] [place], [place] [address]    Phone: [phone] FAX: [phone]     Subjective: No sob.  Passing flatus.  Afebrile.  WBC trending down. Thoracentesis Cx negative to date.    Objective:  Vital signs:  Filed Vitals:   05/21/15 1646 05/21/15 2132 05/21/15 2151 05/22/15 0650  BP: 118/68  169/69 149/68  Pulse: 113  104 109  Temp: 100.3 F (37.9 C)  97.5 F (36.4 C) 98.4 F (36.9 C)  TempSrc: Oral  Oral Oral  Resp: $Remo'20  18 16  'PmAcF$ Height:      Weight:      SpO2: 100% 98% 97% 100%    Last BM Date: 05/21/15  Intake/Output   Yesterday:  08/28 0701 - 08/29 0700 In: 640 [P.O.:540; IV Piggyback:100] Out: 1000 [Urine:1000] This shift:    I/O last 3 completed shifts: In: 1050 [P.O.:600; IV Piggyback:450] Out: 1625 [VZDGL:8756]    Physical Exam: General: Pt awake/alert/oriented x2 in no acute distress Chest:crackles RLL, otherwise clear.  CV:  Pulses intact.  Regular rhythm Abdomen: Soft.  Nondistended.   Mildly tender at incisions only.  Wound is clean, fascia is intact, packing replaced. No evidence of peritonitis.  No incarcerated hernias. Ext:  SCDs BLE.  No mjr edema.  No cyanosis Skin: No petechiae / purpura   Problem List:   Principal Problem:   Gastric ulcer with perforation s/p open omental patch 05/11/2015 Active Problems:   Acute blood loss anemia   Essential hypertension   KNEE PAIN, BILATERAL   Anxiety and depression   Severe protein-calorie malnutrition   Alzheimer's disease   Pleural effusion   Asthmatic bronchitis   Palliative care encounter   Dyspnea   Wheezing    Results:   Labs: Results for orders placed or performed during the hospital encounter of 05/11/15 (from the past 48 hour(s))  Glucose, capillary     Status: None   Collection Time: 05/20/15  8:45 AM  Result Value Ref  Range   Glucose-Capillary 68 65 - 99 mg/dL  Glucose, capillary     Status: None   Collection Time: 05/20/15  9:23 AM  Result Value Ref Range   Glucose-Capillary 72 65 - 99 mg/dL  Glucose, capillary     Status: Abnormal   Collection Time: 05/20/15 12:00 PM  Result Value Ref Range   Glucose-Capillary 110 (H) 65 - 99 mg/dL  Glucose, capillary     Status: Abnormal   Collection Time: 05/20/15  5:08 PM  Result Value Ref Range   Glucose-Capillary 107 (H) 65 - 99 mg/dL  Glucose, capillary     Status: None   Collection Time: 05/20/15 10:40 PM  Result Value Ref Range   Glucose-Capillary 84 65 - 99 mg/dL  Glucose, capillary     Status: None   Collection Time: 05/21/15  8:35 AM  Result Value Ref Range   Glucose-Capillary 67 65 - 99 mg/dL  Vancomycin, trough     Status: Abnormal   Collection Time: 05/21/15  9:25 AM  Result Value Ref Range   Vancomycin Tr 21 (H) 10.0 - 20.0 ug/mL  Glucose, capillary     Status: None   Collection Time: 05/21/15  9:26 AM  Result Value Ref Range   Glucose-Capillary 70 65 - 99 mg/dL  CBC     Status:  Abnormal   Collection Time: 05/21/15 12:00 PM  Result Value Ref Range   WBC 15.4 (H) 4.0 - 10.5 K/uL   RBC 3.09 (L) 3.87 - 5.11 MIL/uL   Hemoglobin 9.5 (L) 12.0 - 15.0 g/dL   HCT 28.4 (L) 36.0 - 46.0 %   MCV 91.9 78.0 - 100.0 fL   MCH 30.7 26.0 - 34.0 pg   MCHC 33.5 30.0 - 36.0 g/dL   RDW 15.3 11.5 - 15.5 %   Platelets 406 (H) 150 - 400 K/uL  Basic metabolic panel     Status: Abnormal   Collection Time: 05/21/15 12:00 PM  Result Value Ref Range   Sodium 137 135 - 145 mmol/L   Potassium 3.9 3.5 - 5.1 mmol/L   Chloride 107 101 - 111 mmol/L   CO2 24 22 - 32 mmol/L   Glucose, Bld 86 65 - 99 mg/dL   BUN 13 6 - 20 mg/dL   Creatinine, Ser 0.80 0.44 - 1.00 mg/dL   Calcium 7.3 (L) 8.9 - 10.3 mg/dL   GFR calc non Af Amer >60 >60 mL/min   GFR calc Af Amer >60 >60 mL/min    Comment: (NOTE) The eGFR has been calculated using the CKD EPI equation. This  calculation has not been validated in all clinical situations. eGFR's persistently <60 mL/min signify possible Chronic Kidney Disease.    Anion gap 6 5 - 15  Glucose, capillary     Status: None   Collection Time: 05/21/15 12:32 PM  Result Value Ref Range   Glucose-Capillary 67 65 - 99 mg/dL  Glucose, capillary     Status: None   Collection Time: 05/21/15  1:07 PM  Result Value Ref Range   Glucose-Capillary 88 65 - 99 mg/dL  CBC     Status: Abnormal   Collection Time: 05/22/15  3:40 AM  Result Value Ref Range   WBC 12.6 (H) 4.0 - 10.5 K/uL   RBC 2.81 (L) 3.87 - 5.11 MIL/uL   Hemoglobin 8.2 (L) 12.0 - 15.0 g/dL   HCT 26.2 (L) 36.0 - 46.0 %   MCV 93.2 78.0 - 100.0 fL   MCH 29.2 26.0 - 34.0 pg   MCHC 31.3 30.0 - 36.0 g/dL   RDW 15.2 11.5 - 15.5 %   Platelets 400 150 - 400 K/uL  Basic metabolic panel     Status: Abnormal   Collection Time: 05/22/15  3:40 AM  Result Value Ref Range   Sodium 137 135 - 145 mmol/L   Potassium 4.1 3.5 - 5.1 mmol/L   Chloride 103 101 - 111 mmol/L   CO2 27 22 - 32 mmol/L   Glucose, Bld 65 65 - 99 mg/dL   BUN 13 6 - 20 mg/dL   Creatinine, Ser 0.78 0.44 - 1.00 mg/dL   Calcium 7.6 (L) 8.9 - 10.3 mg/dL   GFR calc non Af Amer >60 >60 mL/min   GFR calc Af Amer >60 >60 mL/min    Comment: (NOTE) The eGFR has been calculated using the CKD EPI equation. This calculation has not been validated in all clinical situations. eGFR's persistently <60 mL/min signify possible Chronic Kidney Disease.    Anion gap 7 5 - 15    Imaging / Studies: No results found.  Medications / Allergies:  Scheduled Meds: . albuterol  2.5 mg Nebulization Q12H  . antiseptic oral rinse  7 mL Mouth Rinse q12n4p  . chlorhexidine  15 mL Mouth Rinse BID  . furosemide  40 mg Oral  Daily  . heparin  5,000 Units Subcutaneous 3 times per day  . levofloxacin  750 mg Oral Q48H  . mirtazapine  15 mg Oral QHS  . pantoprazole (PROTONIX) IV  80 mg Intravenous Q12H  . potassium chloride  20  mEq Oral Daily   Continuous Infusions:  PRN Meds:.acetaminophen, albuterol, fentaNYL (SUBLIMAZE) injection, iohexol, ondansetron **OR** ondansetron (ZOFRAN) IV, sodium chloride, traMADol  Antibiotics: Anti-infectives    Start     Dose/Rate Route Frequency Ordered Stop   05/22/15 1000  vancomycin (VANCOCIN) IVPB 750 mg/150 ml premix  Status:  Discontinued     750 mg 150 mL/hr over 60 Minutes Intravenous Every 24 hours 05/21/15 1051 05/21/15 1103   05/21/15 1800  levofloxacin (LEVAQUIN) tablet 750 mg     750 mg Oral Every 48 hours 05/21/15 1104     05/19/15 2200  vancomycin (VANCOCIN) 500 mg in sodium chloride 0.9 % 100 mL IVPB  Status:  Discontinued     500 mg 100 mL/hr over 60 Minutes Intravenous Every 12 hours 05/19/15 0934 05/21/15 1051   05/19/15 1100  vancomycin (VANCOCIN) IVPB 1000 mg/200 mL premix     1,000 mg 200 mL/hr over 60 Minutes Intravenous  Once 05/19/15 0934 05/19/15 1200   05/19/15 1000  aztreonam (AZACTAM) 1 g in dextrose 5 % 50 mL IVPB  Status:  Discontinued     1 g 100 mL/hr over 30 Minutes Intravenous Every 8 hours 05/19/15 0856 05/21/15 1103   05/13/15 0600  ciprofloxacin (CIPRO) IVPB 400 mg     400 mg 200 mL/hr over 60 Minutes Intravenous Every 24 hours 05/12/15 0757 05/19/15 0734   05/12/15 0800  metroNIDAZOLE (FLAGYL) IVPB 500 mg     500 mg 100 mL/hr over 60 Minutes Intravenous Every 8 hours 05/12/15 0725 05/19/15 0759   05/12/15 0730  ciprofloxacin (CIPRO) IVPB 400 mg  Status:  Discontinued     400 mg 200 mL/hr over 60 Minutes Intravenous Every 12 hours 05/12/15 0725 05/12/15 0756   05/12/15 0600  ciprofloxacin (CIPRO) IVPB 400 mg     400 mg 200 mL/hr over 60 Minutes Intravenous Every 24 hours 05/11/15 1118 05/12/15 0648   05/11/15 1100  metroNIDAZOLE (FLAGYL) IVPB 500 mg     500 mg 100 mL/hr over 60 Minutes Intravenous Every 8 hours 05/11/15 1036 05/11/15 1227   05/11/15 1045  ciprofloxacin (CIPRO) IVPB 400 mg  Status:  Discontinued     400 mg 200  mL/hr over 60 Minutes Intravenous Every 12 hours 05/11/15 1036 05/11/15 1117   05/11/15 0515  ciprofloxacin (CIPRO) IVPB 400 mg     400 mg 200 mL/hr over 60 Minutes Intravenous  Once 05/11/15 0503 05/11/15 0656   05/11/15 0515  metroNIDAZOLE (FLAGYL) IVPB 500 mg     500 mg 100 mL/hr over 60 Minutes Intravenous  Once 05/11/15 0503 05/11/15 1610       Assessment/Plan POD#11 diagnostic laparoscopy converted to open repair gastric ulcer and omental patch---Dr. Joyice Faster -stable, change to PPI by mouth ID- levaquin total 7 days Acute on chronic blood loss anemia -stable Acute on chronic kidney disease, stage III-stable. Dyspnea and pulmonary edema-PO lasix HTN-? Should we resume Hydralazine  VTE prophylaxis-SCD/heparin FEN-no issues Dementia/AMS-remeron.  Dispo-spoke with patients daughter who lives in [place], pt will be going home.  Lives with sister and granddaughter(primary caretaker).  I left a message for the niece to call back to floor.  Pt stable for discharge.  HH PT, RN and hospital bed ordered.  Erby Pian, [facility] Surgery Pager [phone])   05/22/2015 8:14 AM

## 2015-05-22 NOTE — Progress Notes (Signed)
Occupational Therapy Treatment Patient Details Name: Elizabeth Peterson MRN: [id number] DOB: 08-Sep-1919 Today's Date: 05/22/2015    History of present illness LAPAROSCOPY DIAGNOSTIC converted open repair gastric ulcer and omental patch on 8/18.   OT comments  Pt will need significant at home  Follow Up Recommendations  Home health OT;Supervision/Assistance - 24 hour    Equipment Recommendations  Hospital bed    Recommendations for Other Services      Precautions / Restrictions Precautions Precautions: Fall Precaution Comments: abd. incision, knees contracted Restrictions Weight Bearing Restrictions: No       Mobility Bed Mobility   Bed Mobility: Rolling Rolling: Max assist            Transfers                      Balance                                   ADL Overall ADL's : Needs assistance/impaired     Grooming: Wash/dry face;Bed level;Min guard   Upper Body Bathing: Moderate assistance;Cueing for sequencing;Bed level Upper Body Bathing Details (indicate cue type and reason): pt had taken off gown upon OT arrival. Pt min- mod A to don gown and leave it on                           General ADL Comments: no family present. SW states they were here and were now at home getting hospital bed delivered      [place] During Therapy: [facility] for tasks assessed/performed Overall Cognitive Status: History of cognitive impairments - at baseline                       Extremity/Trunk Assessment               Exercises     Shoulder Instructions       General Comments      Pertinent Vitals/ Pain       Faces Pain Scale: Hurts little more Pain Location: r leg Pain Intervention(s): Monitored during session;Repositioned  Home Living                                          Prior Functioning/Environment               Frequency       Progress Toward Goals  OT Goals(current goals can now be found in the care plan section)  Progress towards OT goals: Progressing toward goals     Plan Discharge plan needs to be updated    Co-evaluation                 End of Session     Activity Tolerance Patient tolerated treatment well   Patient Left in bed;with call bell/phone within reach;with bed alarm set   Nurse Communication Mobility status        Time: 1121-1130 OT Time Calculation (min): 9 min  Charges: OT General Charges $OT Visit: 1 Procedure OT Treatments $Self Care/Home Management :  8-22 mins  Mikael Skoda, Thereasa Parkin 05/22/2015, 12:33 PM

## 2015-05-22 NOTE — Progress Notes (Signed)
Patient requires repositioning frequently, unable to move without the assistance of caregivers.

## 2015-05-22 NOTE — Care Management Note (Signed)
Case Management Note  Patient Details  Name: Elizabeth Peterson MRN: [id number] Date of Birth: [birth year]  Subjective/Objective:         Admitted s/p perf ulcer repair.           Action/Plan: Discharge planning, spoke with daughter and granddaughter regarding d/c plans. Orders received for [facility] and hospital bed. Granddaughter states that they have used [facility] in the past for [facility] services and would like to use them again. Contacted [facility] to arrange, granddaughter will arrange with [facility] to get the bed today. Once bed has been delivered, CSW will arrange transportation to home. No other needs assessed.  Expected Discharge Date:   (UNKNOWN)               Expected Discharge Plan:  [place] (vs SNF)  In-House Referral:  Clinical Social Work  Discharge planning Services  CM Consult  Post Acute Care Choice:  Home Health, Durable Medical Equipment Choice offered to:  Adult Children  DME Arranged:  Hospital bed DME Agency:  [place] Arranged:  RN, PT, OT [facility] Agency:  [place]  Status of Service:  Completed, signed off  Medicare Important Message Given:  Yes-third notification given Date Medicare IM Given:    Medicare IM give by:    Date Additional Medicare IM Given:    Additional Medicare Important Message give by:     If discussed at [place] of Stay Meetings, dates discussed:    Additional Comments:  Guadalupe Maple, RN 05/22/2015, 11:20 AM

## 2015-05-22 NOTE — Discharge Summary (Signed)
Physician Discharge Summary  Neli Fofana WSF:[id number] DOB: [birth year] DOA: 05/11/2015  PCP: Hollace Kinnier, DO  Consultation: ID--Dr. Baxter Flattery   [facility]   Pulmonary--Dr. Young   Admit date: 05/11/2015 Discharge date: 05/22/2015  Recommendations for Outpatient Follow-up:    Follow-up Information    Follow up with Rosario Adie., MD On 0/17/4944.   Specialty:  General Surgery   Why:  arrive by 9AM for a 9:30AM post op check   Contact information:   [address] [place]  [address] [phone]       Follow up with REED, TIFFANY, DO In 1 week.   Specialty:  Geriatric Medicine   Why:  hospital follow up   Contact information:   [place] [place] [address] [phone]      Discharge Diagnoses:  1. Perforated gastric ulcer 2. Hypotension 3. Acute on chronic blood loss anemia 4. Acute on chronic stage III kidney disease 5. Pulmonary edema 6. Right sided pleural effusion 7. Leukocytosis 8. Possible aspiration pneumonia 9. HTN 10. Mental status changes with baseline dementia  11. hypokalemia   Surgical Procedure: diagnostic laparoscopy converted to open repair gastric ulcer and omental patch---Dr. Joyice Faster  Discharge Condition: stabl Disposition: home with [facility]  Diet recommendation: dysphagia 3 diet  Filed Weights   05/11/15 1000 05/12/15 0550  Weight: 53.3 kg (117 lb 8.1 oz) 53.3 kg (117 lb 8.1 oz)      Filed Vitals:   05/22/15 1000  BP: 138/72  Pulse: 100  Temp:   Resp:     Hospital Course:  Fallen Crisostomo presented to [facility] with complaints of generalized abdominal pain for several weeks.  She was found to have perforated gastric ulcer likely from NSAID use.  She had a laparoscopic converted to open repair of gastric ulcer and omental patch.  She tolerated the procedure well and was transferred to the ICU but was able to be extubated.  CCM was consulted for respiratory follow up and internal  medicine for multiple medical problems.    Palliative care medicine was consulted for goals of care.  Antibiotics were continued for peritonitis. Hypotension resolved with IV hydration.  Renal function normalized to baseline.  The patient was mobilized with therapies and progressed to the floor.  She was then found to be fluid overloaded and had a large pleural effusion.  The patient was started on IV lasix and subsequently underwent a right sided thoracentesis.  Cultures did not yield any growth to date.  Antibiotics were changed to Vanc/aztreonam for increasing white count.  CT of a/p did not reveal any intra-abdominal fluid collections.   Antibiotics were subsequently changed to Levaquin for suspected aspiration PNA.  She had a swallow evaluation for this reason and SLP recommended a D3 diet.  Diet was advanced after UGI was negative for a leak and ileus resolved.   The patient was also transfused with 2 units of PRBCs for post op anemia.   On POD#11 the patient was tolerating a diet, having BMs, afebrile. She was therefore felt stable for discharge home with family.  She will need to take PPI for at least 6 weeks.  EGD 6-8 weeks if no better/worse to make sure the ulcer heals.  I have scheduled her a follow up with Dr. Marcello Moores. She is to complete antibiotics as prescribed.  Diuretics were stopped at discharge.  Will need to see her PCP in 1-2 weeks for BP check.  Discharge Instructions     Medication List    STOP taking these medications        mirtazapine 15 MG tablet  Commonly known as:  REMERON     vitamin B-12 1000 MCG tablet  Commonly known as:  CYANOCOBALAMIN      TAKE these medications        acetaminophen 650 MG CR tablet  Commonly known as:  TYLENOL  Take 650 mg by mouth as needed for pain.     AMBULATORY NON FORMULARY MEDICATION  Wheelchair and Cushion     cetirizine 10 MG tablet  Commonly known as:  ZYRTEC  Take 1 tablet (10 mg total) by mouth daily.     gabapentin 100  MG capsule  Commonly known as:  NEURONTIN  Take 1 capsule (100 mg total) by mouth 3 (three) times daily.     hydrALAZINE 50 MG tablet  Commonly known as:  APRESOLINE  Take one tablet by mouth four times daily for blood pressure     levofloxacin 750 MG tablet  Commonly known as:  LEVAQUIN  Take 1 tablet (750 mg total) by mouth every other day. X 6 doses  Start taking on:  05/23/2015     memantine 28 MG Cp24 24 hr capsule  Commonly known as:  NAMENDA XR  Take 28 mg by mouth daily.     pantoprazole 40 MG tablet  Commonly known as:  PROTONIX  Take 1 tablet (40 mg total) by mouth 2 (two) times daily before a meal.     Tdap 5-2.5-18.5 LF-MCG/0.5 injection  Commonly known as:  BOOSTRIX  Inject 0.5 mLs into the muscle once.     traMADol 50 MG tablet  Commonly known as:  ULTRAM  Take 1 tablet (50 mg total) by mouth every 12 (twelve) hours as needed for moderate pain or severe pain.           Follow-up Information    Follow up with Rosario Adie., MD On 01/19/7680.   Specialty:  General Surgery   Why:  arrive by 9AM for a 9:30AM post op check   Contact information:   [place] [address] [phone]        The results of significant diagnostics from this hospitalization (including imaging, microbiology, ancillary and laboratory) are listed below for reference.    Significant Diagnostic Studies: Ct Abdomen Pelvis Wo Contrast  05/18/2015   CLINICAL DATA:  Leukocytosis. Laparoscopic gastric ulcer surgery 05/11/2015 with persistent abdominal pain.  EXAM: CT ABDOMEN AND PELVIS WITHOUT CONTRAST  TECHNIQUE: Multidetector CT imaging of the abdomen and pelvis was performed following the standard protocol without IV contrast.  COMPARISON:  05/11/2015  FINDINGS: Lung bases demonstrate worsening moderate size right effusion and small left effusion with associated compressive atelectasis in the right base. Mild stable cardiomegaly with atherosclerotic coronary  artery disease unchanged. Central venous line seen with tip over the cavoatrial junction.  Abdominal images demonstrate the stomach to be mildly distended with air and minimal wall thickening over the distal stomach/ proximal duodenum likely postsurgical. This mild wall thickening/ edema could be causing a degree of obstruction. No evidence of free peritoneal air. Small amount of perihepatic fluid. Minimal density just below the splenic flexure likely postsurgical.  There are a couple sub cm stable liver hypodensities likely cysts. Suggestion mild contrast excretion within the gallbladder. The spleen, pancreas and adrenal glands are within normal. Kidneys normal in size without nephrolithiasis. There is subtle symmetric  prominence of the intrarenal collecting systems. Appendix not seen.  Contrast and air are present throughout the colon. There is mild diverticulosis over the sigmoid colon. There are several minimally dilated small bowel loops likely postoperative ileus.  Abdominal wall incision anteriorly in the midline. Bilateral subcutaneous edema over the abdominal wall and pelvic soft tissues.  Calcified plaque of the abdominal aorta and iliac arteries.  Pelvic images demonstrate a Foley catheter present within a decompressed bladder. There is mild fecal retention over the rectum. There is a small amount of free pelvic fluid present.  There are degenerative changes of the spine with multilevel disc disease over the lumbar spine and subtle grade 1 anterolisthesis of L3 on L4 and L4 on L5 unchanged. Mild degenerate change of the hips.  IMPRESSION: Mild gaseous distention of the stomach with minimal wall thickening over the distal stomach/ proximal duodenum likely postsurgical edema and may be causing a mild degree of obstruction. Several mildly dilated small bowel loops likely postoperative ileus. Small amount of peritoneal fluid likely postsurgical. No free peritoneal air.  Worsening moderate size right effusion  and small left effusion with associated compressive atelectasis in the right base.  Mild diverticulosis of the sigmoid colon.  Two small subcentimeter liver hypodensities unchanged likely cysts.  Stable cardiomegaly and atherosclerotic coronary artery disease.  Other stable chronic finding as described.   Electronically Signed   By: Marin Olp M.D.   On: 05/18/2015 14:25   Ct Abdomen Pelvis Wo Contrast  05/11/2015   CLINICAL DATA:  Generalized abdominal pain, worse last night.  EXAM: CT ABDOMEN AND PELVIS WITHOUT CONTRAST  TECHNIQUE: Multidetector CT imaging of the abdomen and pelvis was performed following the standard protocol without IV contrast.  COMPARISON:  None.  FINDINGS: BODY WALL: No contributory findings.  LOWER CHEST: Small bilateral pleural effusion with right basilar atelectasis. Small lower pneumomediastinum through hiatal hernia.  ABDOMEN/PELVIS:  Liver: Few sub cm low densities favor cysts.  Biliary: Haziness of fat around the gallbladder is likely secondary. No primary inflammation suspected.  Pancreas: Unremarkable.  Spleen: Unremarkable.  Adrenals: Unremarkable.  Kidneys and ureters: Mild bilateral renal cortical atrophy. No hydronephrosis.  Bladder: Distended without wall thickening.  Reproductive: Hysterectomy.  No adnexal mass.  Bowel: There is a large volume of pneumoperitoneum distributed throughout the abdomen. Given the large volume, distribution is not helpful in identifying a source. Small volume ascites accompanies the fluid, with probable peritoneal thickening in the pelvic recesses. The source of perforation is uncertain; there is no definitive pneumatosis or ulceration. No inflammatory wall thickening around the gastric antrum or duodenum. The cecum is gas filled and ventrally rotated, but there is no bascule or volvulus and no associated pneumatosis. Oral contrast reaches the third portion duodenum and there is no contrast extravasation.  Vascular: No acute abnormality.   OSSEOUS: No acute abnormalities.  These results were called by telephone at the time of interpretation on 05/11/2015 at 5:02 am to Dr. Julianne Rice , who verbally acknowledged these results.  IMPRESSION: 1. Perforated bowel with large pneumoperitoneum and small ascites. The site of perforation is uncertain; no extravasation of oral contrast which reaches the third portion duodenum. Colonic diverticulosis is present. 2. Atelectasis and small bilateral pleural effusion.   Electronically Signed   By: Monte Fantasia M.D.   On: 05/11/2015 05:07   Dg Chest 2 View  05/18/2015   CLINICAL DATA:  Leukocytosis. No chest complaints. History of hypertension, asthma, dementia.  EXAM: CHEST  2 VIEW  COMPARISON:  05/16/2015 and earlier  FINDINGS: Right-sided PICC line tip overlies the level of the right atrium. Right scratched of bilateral pleural effusions are present, right greater than left. Bilateral lung opacities obscure the hemidiaphragms bilaterally, right greater than left. Little change since prior study. Residual contrast identified in the colon. Cardiomegaly.  IMPRESSION: 1. Persistent bilateral infiltrates, right greater than left. 2. Bilateral pleural effusions.   Electronically Signed   By: Nolon Nations M.D.   On: 05/18/2015 13:47   Dg Chest Port 1 View  05/19/2015   CLINICAL DATA:  81 year old female with right pleural effusion -status post thoracentesis.  EXAM: PORTABLE CHEST - 1 VIEW  COMPARISON:  05/19/2015 and prior exams  FINDINGS: Right lower lung atelectasis and probable right pleural effusion noted.  There is no evidence of pneumothorax.  Cardiomegaly and right PICC line with tip overlying the superior cavoatrial junction again noted.  Mild left basilar atelectasis is noted.  IMPRESSION: Little significant change in appearance of the chest. No evidence of pneumothorax identified on the study.  Bilateral lower lung atelectasis, right greater than left, and probable right pleural effusion again  noted.   Electronically Signed   By: Margarette Canada M.D.   On: 05/19/2015 15:31   Dg Chest Port 1 View  05/19/2015   CLINICAL DATA:  Status post thoracentesis right side. History of asthma, hypertension, pneumonia, left mastectomy.  EXAM: PORTABLE CHEST - 1 VIEW  COMPARISON:  05/18/2015  FINDINGS: Right-sided PICC line tip overlies the level of the lower superior vena cava, unchanged. There continues be dense opacification at the right lung base obscuring the hemidiaphragm. Horizontal level of the pleural effusion raises the question of air within the pleural space although no definite pneumothorax can be identified. There is persistent opacity in the left lung base as well. EKG lead overlies the left lung apex. Residual contrast is seen the colon.  IMPRESSION: 1. Status post right pneumothorax scratched a status post right thoracentesis. 2. Question of small right pneumothorax.  Follow-up is recommended. The salient findings were discussed with Noe Gens on 05/19/2015 at 1:41 pm.   Electronically Signed   By: Nolon Nations M.D.   On: 05/19/2015 13:41   Dg Chest Port 1 View  05/16/2015   CLINICAL DATA:  Increasing short of breath  EXAM: PORTABLE CHEST - 1 VIEW  COMPARISON:  05/14/2015  FINDINGS: The heart is moderately enlarged. Pulmonary edema has developed. Opacity at the right base is stable likely a combination of volume loss, consolidation, and pleural fluid. Atelectasis at the left base is stable. Tiny left pleural effusion is not excluded.  IMPRESSION: New onset of pulmonary edema. Bibasilar atelectasis and right pleural effusion are stable.   Electronically Signed   By: Marybelle Killings M.D.   On: 05/16/2015 08:19   Dg Chest Port 1 View  05/14/2015   CLINICAL DATA:  Shortness of breath  EXAM: PORTABLE CHEST - 1 VIEW  COMPARISON:  05/12/2015  FINDINGS: Nasogastric tube tip has been removed. Dilated colon again seen in the left abdomen.  Bilateral basilar opacification, greater on the right. Appearance  suggests associated pleural fluid. Unchanged cardiopericardial enlargement. Stable upper mediastinal contours.  IMPRESSION: Stable bibasilar lung opacity, greater on the right. Admission abdominal CT favors pleural fluid and atelectasis.   Electronically Signed   By: Monte Fantasia M.D.   On: 05/14/2015 06:14   Dg Chest Port 1 View  05/12/2015   CLINICAL DATA:  Respiratory failure.  EXAM: PORTABLE CHEST - 1 VIEW  COMPARISON:  05/11/2015.  FINDINGS: NG tube noted coiled in stomach. Mediastinum hilar structures are normal. Heart size stable. Right lower lobe infiltrate consistent pneumonia. Right pleural effusion. Subsegmental atelectasis left lung base. No pneumothorax. Surgical clip left upper abdomen.  IMPRESSION: 1.  NG tube noted coiled in stomach.  2. Right lower lobe infiltrate consistent pneumonia. Moderate right pleural effusion.   Electronically Signed   By: Marcello Moores  Register   On: 05/12/2015 07:10   Dg Chest Port 1 View  05/11/2015   CLINICAL DATA:  Hypoxia  EXAM: PORTABLE CHEST - 1 VIEW  COMPARISON:  02/28/2013  FINDINGS: Normal heart size and aortic contours.  Atelectasis in the right upper lobe seen on the previous study has resolved. There is an indistinct opacity at the right base obscuring the diaphragm which is elevated compared to prior. No edema, effusion, or air leak.  IMPRESSION: Hazy opacity at the right base favoring atelectasis. This area should be seen on abdominal CT scheduled for later today.   Electronically Signed   By: Monte Fantasia M.D.   On: 05/11/2015 03:41   Dg Duanne Limerick W/water Sol Cm  05/16/2015   CLINICAL DATA:  Patient status post repair per stroke perforated gastric ulcer with omental patch.  EXAM: WATER SOLUBLE UPPER GI SERIES  TECHNIQUE: Single-column upper GI series was performed using water soluble contrast.  CONTRAST:  61mL OMNIPAQUE IOHEXOL 300 MG/ML  SOLN  COMPARISON:  None.  FLUOROSCOPY TIME:  Radiation Exposure Index (as provided by the fluoroscopic device):  If  the device does not provide the exposure index:  Fluoroscopy Time (in minutes and seconds):  1 minutes 52 seconds  Number of Acquired Images:  8  FINDINGS: Exam is suboptimal due to limited intake of the oral contrast as well as limited patient mobility. The contrast does flow easily into the stomach and ultimately passes into the first and second second portion of the duodenum. Stomach is gas-filled. No evidence of leak of the oral contrast.  IMPRESSION: No evidence of leak of oral contrast from the stomach on limited exam.   Electronically Signed   By: Suzy Bouchard M.D.   On: 05/16/2015 15:04    Microbiology: Recent Results (from the past 240 hour(s))  Culture, blood (routine x 2)     Status: None   Collection Time: 05/15/15 10:15 PM  Result Value Ref Range Status   Specimen Description BLOOD LEFT ANTECUBITAL  Final   Special Requests IN PEDIATRIC BOTTLE [place]  Final   Culture   Final    NO GROWTH 5 DAYS Performed at [facility]    Report Status 05/20/2015 FINAL  Final  Culture, blood (routine x 2)     Status: None   Collection Time: 05/16/15  7:05 AM  Result Value Ref Range Status   Specimen Description BLOOD RIGHT ARM  Final   Special Requests IN PEDIATRIC BOTTLE 3CC  Final   Culture   Final    NO GROWTH 5 DAYS Performed at [facility]    Report Status 05/21/2015 FINAL  Final  Culture, Urine     Status: None   Collection Time: 05/19/15 12:08 AM  Result Value Ref Range Status   Specimen Description URINE, RANDOM  Final   Special Requests NONE  Final   Culture   Final    50,000 COLONIES/mL YEAST Performed at [facility]    Report Status 05/20/2015 FINAL  Final  Body fluid culture     Status: None (Preliminary result)  Collection Time: 05/19/15 11:56 AM  Result Value Ref Range Status   Specimen Description PLEURAL  Final   Special Requests NONE  Final   Gram Stain   Final    MODERATE WBC PRESENT, PREDOMINANTLY MONONUCLEAR NO ORGANISMS SEEN     Culture   Final    NO GROWTH 2 DAYS Performed at [facility]    Report Status PENDING  Incomplete  Fungal stain     Status: None   Collection Time: 05/19/15 11:56 AM  Result Value Ref Range Status   Specimen Description PLEURAL  Final   Special Requests NONE  Final   Fungal Smear   Final    NO YEAST OR FUNGAL ELEMENTS SEEN Performed at [facility]    Report Status 05/20/2015 FINAL  Final     Labs: Basic Metabolic Panel:  Recent Labs Lab 05/17/15 0513 05/18/15 0605 05/19/15 0502 05/20/15 0500 05/21/15 1200 05/22/15 0340  NA 145 141 140 137 137 137  K 3.1* 3.0* 4.2 4.6 3.9 4.1  CL 119* 115* 112* 110 107 103  CO2 18* 21* 23 24 24 27   GLUCOSE 83 88 94 72 86 65  BUN 13 11 11 11 13 13   CREATININE 1.02* 1.04* 0.86 0.82 0.80 0.78  CALCIUM 7.3* 7.1* 7.6* 7.4* 7.3* 7.6*  MG 1.3* 1.6*  --   --   --   --    Liver Function Tests:  Recent Labs Lab 05/19/15 0500  PROT 5.4*   No results for input(s): LIPASE, AMYLASE in the last 168 hours.  Recent Labs Lab 05/15/15 2215  AMMONIA 21   CBC:  Recent Labs Lab 05/18/15 0605 05/19/15 0502 05/20/15 0500 05/21/15 1200 05/22/15 0340  WBC 14.9* 18.2* 15.4* 15.4* 12.6*  HGB 8.6* 9.5* 8.7* 9.5* 8.2*  HCT 25.1* 27.6* 25.4* 28.4* 26.2*  MCV 90.0 89.6 91.0 91.9 93.2  PLT 278 347 343 406* 400   Cardiac Enzymes: No results for input(s): CKTOTAL, CKMB, CKMBINDEX, TROPONINI in the last 168 hours. BNP: BNP (last 3 results)  Recent Labs  05/16/15 0705  BNP 3210.9*    ProBNP (last 3 results) No results for input(s): PROBNP in the last 8760 hours.  CBG:  Recent Labs Lab 05/21/15 0835 05/21/15 0926 05/21/15 1232 05/21/15 1307 05/22/15 0728  GLUCAP 67 70 67 88 85    Principal Problem:   Gastric ulcer with perforation s/p open omental patch 05/11/2015 Active Problems:   Acute blood loss anemia   Essential hypertension   KNEE PAIN, BILATERAL   Anxiety and depression   Severe protein-calorie  malnutrition   Alzheimer's disease   Pleural effusion   Asthmatic bronchitis   Palliative care encounter   Dyspnea   Wheezing   Time coordinating discharge: <30 mins  Signed:  Agustina Witzke, ANP-BC

## 2015-05-22 NOTE — Progress Notes (Signed)
[facility] Consult progress note                                                                               Patient Demographics  Elizabeth Peterson, is a 81 y.o. female, DOB - 05-30-1919, TIW:[id number]  Admit date - 05/11/2015   Admitting Physician Leighton Ruff, MD  Outpatient Primary MD for the patient is REED, TIFFANY, DO  LOS - 11   Chief Complaint  Patient presents with  . Abdominal Pain       Brief HPI   Patient is a 81 year old female with hypertension, hyperlipidemia, dementia (confirmed with granddaughter at the bedside), GERD, anemi, wheelchair-bound due to osteoarthritis a had originally presented on 8/18 to the ED with moderate generalized abdominal pain for several weeks which worsened her day before the admission. She also had associated nausea and vomiting on the day of admission. Patient had significant tenderness with rebound and guarding and was found to have perforated gastric ulcer. Patient underwent laparoscopic repair of the gastric ulcer and an omental patch. Postoperative care was managed by critical care service, postop period was complicated by ongoing hypotension, acute kidney injury, worsening mental status superimposed on dementia. She was also noticed to have small pleural effusion with wheezing.  Palliative medicine was also consulted for goals of care. Internal medicine consulted today for medical comanagement.    Assessment & Plan    Active Problems: Dyspnea with pulmonary edema- shortness of breath, resolved - Continue oral lasix for now, DC'd lasix upon discharge  - Swallow evaluation was done, patient on dysphagia 3 diet  Right-sided pleural effusion, leukocytosis, possible aspiration pneumonia - On vancomycin and aztreonam (patient is allergic to penicillin) - Blood cultures negative so far, thoracentesis done on 8/26, studies consistent with transudative effusion - No intra-abdominal abscess seen on the CT abdomen -  Chest x-ray showed persistent bilateral infiltrates right greater than left, possibly has underlying aspiration  - cont oral levaquin every other day, for 6 more doses. WBC trending down, afebrile.    Gastric ulcer with perforation and open repair - Management per the primary team  - continue PPI  Altered mental status with underlying Alzheimer's dementia- overall mental status is  improving  - UA negative for any UTI, B12, folate, TSH all normal. Did not obtain CT head as mental status is improving.    Anemia Postop H&H is stable  Hypokalemia On daily replacement while on lasix, DC'd potassium replacement upon discharge   Hypoglycemia: Likely due to poor PO intake  disposition Plan: Per surgery, stable from medical stand point for discharge. I will sign off.  Time Spent in minutes 38mins   Procedures  Chest x-ray CT abd  DVT Prophylaxis  heparin subcutaneous  Medications  Scheduled Meds: . albuterol  2.5 mg Nebulization Q12H  . antiseptic oral rinse  7 mL Mouth Rinse q12n4p  . chlorhexidine  15 mL Mouth Rinse BID  . furosemide  40 mg Oral Daily  . heparin  5,000 Units Subcutaneous 3 times per day  . levofloxacin  750 mg Oral Q48H  . mirtazapine  15 mg Oral QHS  . pantoprazole  40  mg Oral BID AC  . potassium chloride  20 mEq Oral Daily   Continuous Infusions:  PRN Meds:.acetaminophen, albuterol, fentaNYL (SUBLIMAZE) injection, iohexol, ondansetron **OR** ondansetron (ZOFRAN) IV, sodium chloride, traMADol   Antibiotics   Anti-infectives    Start     Dose/Rate Route Frequency Ordered Stop   05/23/15 0000  levofloxacin (LEVAQUIN) 750 MG tablet     750 mg Oral Every 48 hours 05/22/15 1032     05/22/15 1000  vancomycin (VANCOCIN) IVPB 750 mg/150 ml premix  Status:  Discontinued     750 mg 150 mL/hr over 60 Minutes Intravenous Every 24 hours 05/21/15 1051 05/21/15 1103   05/21/15 1800  levofloxacin (LEVAQUIN) tablet 750 mg     750 mg Oral Every 48 hours 05/21/15 1104      05/19/15 2200  vancomycin (VANCOCIN) 500 mg in sodium chloride 0.9 % 100 mL IVPB  Status:  Discontinued     500 mg 100 mL/hr over 60 Minutes Intravenous Every 12 hours 05/19/15 0934 05/21/15 1051   05/19/15 1100  vancomycin (VANCOCIN) IVPB 1000 mg/200 mL premix     1,000 mg 200 mL/hr over 60 Minutes Intravenous  Once 05/19/15 0934 05/19/15 1200   05/19/15 1000  aztreonam (AZACTAM) 1 g in dextrose 5 % 50 mL IVPB  Status:  Discontinued     1 g 100 mL/hr over 30 Minutes Intravenous Every 8 hours 05/19/15 0856 05/21/15 1103   05/13/15 0600  ciprofloxacin (CIPRO) IVPB 400 mg     400 mg 200 mL/hr over 60 Minutes Intravenous Every 24 hours 05/12/15 0757 05/19/15 0734   05/12/15 0800  metroNIDAZOLE (FLAGYL) IVPB 500 mg     500 mg 100 mL/hr over 60 Minutes Intravenous Every 8 hours 05/12/15 0725 05/19/15 0759   05/12/15 0730  ciprofloxacin (CIPRO) IVPB 400 mg  Status:  Discontinued     400 mg 200 mL/hr over 60 Minutes Intravenous Every 12 hours 05/12/15 0725 05/12/15 0756   05/12/15 0600  ciprofloxacin (CIPRO) IVPB 400 mg     400 mg 200 mL/hr over 60 Minutes Intravenous Every 24 hours 05/11/15 1118 05/12/15 0648   05/11/15 1100  metroNIDAZOLE (FLAGYL) IVPB 500 mg     500 mg 100 mL/hr over 60 Minutes Intravenous Every 8 hours 05/11/15 1036 05/11/15 1227   05/11/15 1045  ciprofloxacin (CIPRO) IVPB 400 mg  Status:  Discontinued     400 mg 200 mL/hr over 60 Minutes Intravenous Every 12 hours 05/11/15 1036 05/11/15 1117   05/11/15 0515  ciprofloxacin (CIPRO) IVPB 400 mg     400 mg 200 mL/hr over 60 Minutes Intravenous  Once 05/11/15 0503 05/11/15 0656   05/11/15 0515  metroNIDAZOLE (FLAGYL) IVPB 500 mg     500 mg 100 mL/hr over 60 Minutes Intravenous  Once 05/11/15 0503 05/11/15 7341        Subjective:   Elizabeth Peterson was seen and examined. No complaints, feels good, afebrile.  Patient denies dizziness, chest pain, N/V, new weakness, numbess, tingling. No acute events overnight.    Objective:   Blood pressure 138/72, pulse 100, temperature 98.4 F (36.9 C), temperature source Oral, resp. rate 16, height 5\' 3"  (1.6 m), weight 53.3 kg (117 lb 8.1 oz), SpO2 100 %.  Wt Readings from Last 3 Encounters:  05/12/15 53.3 kg (117 lb 8.1 oz)  12/06/13 50.44 kg (111 lb 3.2 oz)  09/06/13 48.988 kg (108 lb)     Intake/Output Summary (Last 24 hours) at 05/22/15 1032 Last data filed  at 05/22/15 0900  Gross per 24 hour  Intake    640 ml  Output    800 ml  Net   -160 ml    Exam  General:  Awake, NAD  HEENT:  PERRLA, EOMI  Neck: Supple, no JVD  CVS: S1 S2 clear RRR   Respiratory:  CTAB  Abdomen: Soft, dressing intact, NBS  Ext: no cyanosis clubbing or edema  Neuro: no new deficits  Skin: No rashes  Psych: awake and oriented x2   Data Review   Micro Results Recent Results (from the past 240 hour(s))  Culture, blood (routine x 2)     Status: None   Collection Time: 05/15/15 10:15 PM  Result Value Ref Range Status   Specimen Description BLOOD LEFT ANTECUBITAL  Final   Special Requests IN PEDIATRIC BOTTLE [name]  Final   Culture   Final    NO GROWTH 5 DAYS Performed at [facility]    Report Status 05/20/2015 FINAL  Final  Culture, blood (routine x 2)     Status: None   Collection Time: 05/16/15  7:05 AM  Result Value Ref Range Status   Specimen Description BLOOD RIGHT ARM  Final   Special Requests IN PEDIATRIC BOTTLE 3CC  Final   Culture   Final    NO GROWTH 5 DAYS Performed at [facility]    Report Status 05/21/2015 FINAL  Final  Culture, Urine     Status: None   Collection Time: 05/19/15 12:08 AM  Result Value Ref Range Status   Specimen Description URINE, RANDOM  Final   Special Requests NONE  Final   Culture   Final    50,000 COLONIES/mL YEAST Performed at [facility]    Report Status 05/20/2015 FINAL  Final  Body fluid culture     Status: None (Preliminary result)   Collection Time: 05/19/15 11:56 AM  Result  Value Ref Range Status   Specimen Description PLEURAL  Final   Special Requests NONE  Final   Gram Stain   Final    MODERATE WBC PRESENT, PREDOMINANTLY MONONUCLEAR NO ORGANISMS SEEN    Culture   Final    NO GROWTH 2 DAYS Performed at [facility]    Report Status PENDING  Incomplete  Fungal stain     Status: None   Collection Time: 05/19/15 11:56 AM  Result Value Ref Range Status   Specimen Description PLEURAL  Final   Special Requests NONE  Final   Fungal Smear   Final    NO YEAST OR FUNGAL ELEMENTS SEEN Performed at [facility]    Report Status 05/20/2015 FINAL  Final    Radiology Reports Ct Abdomen Pelvis Wo Contrast  05/18/2015   CLINICAL DATA:  Leukocytosis. Laparoscopic gastric ulcer surgery 05/11/2015 with persistent abdominal pain.  EXAM: CT ABDOMEN AND PELVIS WITHOUT CONTRAST  TECHNIQUE: Multidetector CT imaging of the abdomen and pelvis was performed following the standard protocol without IV contrast.  COMPARISON:  05/11/2015  FINDINGS: Lung bases demonstrate worsening moderate size right effusion and small left effusion with associated compressive atelectasis in the right base. Mild stable cardiomegaly with atherosclerotic coronary artery disease unchanged. Central venous line seen with tip over the cavoatrial junction.  Abdominal images demonstrate the stomach to be mildly distended with air and minimal wall thickening over the distal stomach/ proximal duodenum likely postsurgical. This mild wall thickening/ edema could be causing a degree of obstruction. No evidence of free peritoneal air. Small  amount of perihepatic fluid. Minimal density just below the splenic flexure likely postsurgical.  There are a couple sub cm stable liver hypodensities likely cysts. Suggestion mild contrast excretion within the gallbladder. The spleen, pancreas and adrenal glands are within normal. Kidneys normal in size without nephrolithiasis. There is subtle symmetric prominence of  the intrarenal collecting systems. Appendix not seen.  Contrast and air are present throughout the colon. There is mild diverticulosis over the sigmoid colon. There are several minimally dilated small bowel loops likely postoperative ileus.  Abdominal wall incision anteriorly in the midline. Bilateral subcutaneous edema over the abdominal wall and pelvic soft tissues.  Calcified plaque of the abdominal aorta and iliac arteries.  Pelvic images demonstrate a Foley catheter present within a decompressed bladder. There is mild fecal retention over the rectum. There is a small amount of free pelvic fluid present.  There are degenerative changes of the spine with multilevel disc disease over the lumbar spine and subtle grade 1 anterolisthesis of L3 on L4 and L4 on L5 unchanged. Mild degenerate change of the hips.  IMPRESSION: Mild gaseous distention of the stomach with minimal wall thickening over the distal stomach/ proximal duodenum likely postsurgical edema and may be causing a mild degree of obstruction. Several mildly dilated small bowel loops likely postoperative ileus. Small amount of peritoneal fluid likely postsurgical. No free peritoneal air.  Worsening moderate size right effusion and small left effusion with associated compressive atelectasis in the right base.  Mild diverticulosis of the sigmoid colon.  Two small subcentimeter liver hypodensities unchanged likely cysts.  Stable cardiomegaly and atherosclerotic coronary artery disease.  Other stable chronic finding as described.   Electronically Signed   By: Marin Olp M.D.   On: 05/18/2015 14:25   Ct Abdomen Pelvis Wo Contrast  05/11/2015   CLINICAL DATA:  Generalized abdominal pain, worse last night.  EXAM: CT ABDOMEN AND PELVIS WITHOUT CONTRAST  TECHNIQUE: Multidetector CT imaging of the abdomen and pelvis was performed following the standard protocol without IV contrast.  COMPARISON:  None.  FINDINGS: BODY WALL: No contributory findings.  LOWER  CHEST: Small bilateral pleural effusion with right basilar atelectasis. Small lower pneumomediastinum through hiatal hernia.  ABDOMEN/PELVIS:  Liver: Few sub cm low densities favor cysts.  Biliary: Haziness of fat around the gallbladder is likely secondary. No primary inflammation suspected.  Pancreas: Unremarkable.  Spleen: Unremarkable.  Adrenals: Unremarkable.  Kidneys and ureters: Mild bilateral renal cortical atrophy. No hydronephrosis.  Bladder: Distended without wall thickening.  Reproductive: Hysterectomy.  No adnexal mass.  Bowel: There is a large volume of pneumoperitoneum distributed throughout the abdomen. Given the large volume, distribution is not helpful in identifying a source. Small volume ascites accompanies the fluid, with probable peritoneal thickening in the pelvic recesses. The source of perforation is uncertain; there is no definitive pneumatosis or ulceration. No inflammatory wall thickening around the gastric antrum or duodenum. The cecum is gas filled and ventrally rotated, but there is no bascule or volvulus and no associated pneumatosis. Oral contrast reaches the third portion duodenum and there is no contrast extravasation.  Vascular: No acute abnormality.  OSSEOUS: No acute abnormalities.  These results were called by telephone at the time of interpretation on 05/11/2015 at 5:02 am to Dr. Julianne Rice , who verbally acknowledged these results.  IMPRESSION: 1. Perforated bowel with large pneumoperitoneum and small ascites. The site of perforation is uncertain; no extravasation of oral contrast which reaches the third portion duodenum. Colonic diverticulosis is present. 2. Atelectasis and  small bilateral pleural effusion.   Electronically Signed   By: Monte Fantasia M.D.   On: 05/11/2015 05:07   Dg Chest 2 View  05/18/2015   CLINICAL DATA:  Leukocytosis. No chest complaints. History of hypertension, asthma, dementia.  EXAM: CHEST  2 VIEW  COMPARISON:  05/16/2015 and earlier   FINDINGS: Right-sided PICC line tip overlies the level of the right atrium. Right scratched of bilateral pleural effusions are present, right greater than left. Bilateral lung opacities obscure the hemidiaphragms bilaterally, right greater than left. Little change since prior study. Residual contrast identified in the colon. Cardiomegaly.  IMPRESSION: 1. Persistent bilateral infiltrates, right greater than left. 2. Bilateral pleural effusions.   Electronically Signed   By: Nolon Nations M.D.   On: 05/18/2015 13:47   Dg Chest Port 1 View  05/19/2015   CLINICAL DATA:  81 year old female with right pleural effusion -status post thoracentesis.  EXAM: PORTABLE CHEST - 1 VIEW  COMPARISON:  05/19/2015 and prior exams  FINDINGS: Right lower lung atelectasis and probable right pleural effusion noted.  There is no evidence of pneumothorax.  Cardiomegaly and right PICC line with tip overlying the superior cavoatrial junction again noted.  Mild left basilar atelectasis is noted.  IMPRESSION: Little significant change in appearance of the chest. No evidence of pneumothorax identified on the study.  Bilateral lower lung atelectasis, right greater than left, and probable right pleural effusion again noted.   Electronically Signed   By: Margarette Canada M.D.   On: 05/19/2015 15:31   Dg Chest Port 1 View  05/19/2015   CLINICAL DATA:  Status post thoracentesis right side. History of asthma, hypertension, pneumonia, left mastectomy.  EXAM: PORTABLE CHEST - 1 VIEW  COMPARISON:  05/18/2015  FINDINGS: Right-sided PICC line tip overlies the level of the lower superior vena cava, unchanged. There continues be dense opacification at the right lung base obscuring the hemidiaphragm. Horizontal level of the pleural effusion raises the question of air within the pleural space although no definite pneumothorax can be identified. There is persistent opacity in the left lung base as well. EKG lead overlies the left lung apex. Residual contrast  is seen the colon.  IMPRESSION: 1. Status post right pneumothorax scratched a status post right thoracentesis. 2. Question of small right pneumothorax.  Follow-up is recommended. The salient findings were discussed with Noe Gens on 05/19/2015 at 1:41 pm.   Electronically Signed   By: Nolon Nations M.D.   On: 05/19/2015 13:41   Dg Chest Port 1 View  05/16/2015   CLINICAL DATA:  Increasing short of breath  EXAM: PORTABLE CHEST - 1 VIEW  COMPARISON:  05/14/2015  FINDINGS: The heart is moderately enlarged. Pulmonary edema has developed. Opacity at the right base is stable likely a combination of volume loss, consolidation, and pleural fluid. Atelectasis at the left base is stable. Tiny left pleural effusion is not excluded.  IMPRESSION: New onset of pulmonary edema. Bibasilar atelectasis and right pleural effusion are stable.   Electronically Signed   By: Marybelle Killings M.D.   On: 05/16/2015 08:19   Dg Chest Port 1 View  05/14/2015   CLINICAL DATA:  Shortness of breath  EXAM: PORTABLE CHEST - 1 VIEW  COMPARISON:  05/12/2015  FINDINGS: Nasogastric tube tip has been removed. Dilated colon again seen in the left abdomen.  Bilateral basilar opacification, greater on the right. Appearance suggests associated pleural fluid. Unchanged cardiopericardial enlargement. Stable upper mediastinal contours.  IMPRESSION: Stable bibasilar lung opacity, greater on  the right. Admission abdominal CT favors pleural fluid and atelectasis.   Electronically Signed   By: Monte Fantasia M.D.   On: 05/14/2015 06:14   Dg Chest Port 1 View  05/12/2015   CLINICAL DATA:  Respiratory failure.  EXAM: PORTABLE CHEST - 1 VIEW  COMPARISON:  05/11/2015.  FINDINGS: NG tube noted coiled in stomach. Mediastinum hilar structures are normal. Heart size stable. Right lower lobe infiltrate consistent pneumonia. Right pleural effusion. Subsegmental atelectasis left lung base. No pneumothorax. Surgical clip left upper abdomen.  IMPRESSION: 1.  NG tube  noted coiled in stomach.  2. Right lower lobe infiltrate consistent pneumonia. Moderate right pleural effusion.   Electronically Signed   By: Marcello Moores  Register   On: 05/12/2015 07:10   Dg Chest Port 1 View  05/11/2015   CLINICAL DATA:  Hypoxia  EXAM: PORTABLE CHEST - 1 VIEW  COMPARISON:  02/28/2013  FINDINGS: Normal heart size and aortic contours.  Atelectasis in the right upper lobe seen on the previous study has resolved. There is an indistinct opacity at the right base obscuring the diaphragm which is elevated compared to prior. No edema, effusion, or air leak.  IMPRESSION: Hazy opacity at the right base favoring atelectasis. This area should be seen on abdominal CT scheduled for later today.   Electronically Signed   By: Monte Fantasia M.D.   On: 05/11/2015 03:41   Dg Duanne Limerick W/water Sol Cm  05/16/2015   CLINICAL DATA:  Patient status post repair per stroke perforated gastric ulcer with omental patch.  EXAM: WATER SOLUBLE UPPER GI SERIES  TECHNIQUE: Single-column upper GI series was performed using water soluble contrast.  CONTRAST:  6mL OMNIPAQUE IOHEXOL 300 MG/ML  SOLN  COMPARISON:  None.  FLUOROSCOPY TIME:  Radiation Exposure Index (as provided by the fluoroscopic device):  If the device does not provide the exposure index:  Fluoroscopy Time (in minutes and seconds):  1 minutes 52 seconds  Number of Acquired Images:  8  FINDINGS: Exam is suboptimal due to limited intake of the oral contrast as well as limited patient mobility. The contrast does flow easily into the stomach and ultimately passes into the first and second second portion of the duodenum. Stomach is gas-filled. No evidence of leak of the oral contrast.  IMPRESSION: No evidence of leak of oral contrast from the stomach on limited exam.   Electronically Signed   By: Suzy Bouchard M.D.   On: 05/16/2015 15:04    CBC  Recent Labs Lab 05/18/15 0605 05/19/15 0502 05/20/15 0500 05/21/15 1200 05/22/15 0340  WBC 14.9* 18.2* 15.4* 15.4*  12.6*  HGB 8.6* 9.5* 8.7* 9.5* 8.2*  HCT 25.1* 27.6* 25.4* 28.4* 26.2*  PLT 278 347 343 406* 400  MCV 90.0 89.6 91.0 91.9 93.2  MCH 30.8 30.8 31.2 30.7 29.2  MCHC 34.3 34.4 34.3 33.5 31.3  RDW 14.9 14.9 15.4 15.3 15.2    Chemistries   Recent Labs Lab 05/17/15 0513 05/18/15 0605 05/19/15 0502 05/20/15 0500 05/21/15 1200 05/22/15 0340  NA 145 141 140 137 137 137  K 3.1* 3.0* 4.2 4.6 3.9 4.1  CL 119* 115* 112* 110 107 103  CO2 18* 21* 23 24 24 27   GLUCOSE 83 88 94 72 86 65  BUN 13 11 11 11 13 13   CREATININE 1.02* 1.04* 0.86 0.82 0.80 0.78  CALCIUM 7.3* 7.1* 7.6* 7.4* 7.3* 7.6*  MG 1.3* 1.6*  --   --   --   --    ------------------------------------------------------------------------------------------------------------------  estimated creatinine clearance is 35.6 mL/min (by C-G formula based on Cr of 0.78). ------------------------------------------------------------------------------------------------------------------ No results for input(s): HGBA1C in the last 72 hours. ------------------------------------------------------------------------------------------------------------------ No results for input(s): CHOL, HDL, LDLCALC, TRIG, CHOLHDL, LDLDIRECT in the last 72 hours. ------------------------------------------------------------------------------------------------------------------ No results for input(s): TSH, T4TOTAL, T3FREE, THYROIDAB in the last 72 hours.  Invalid input(s): FREET3 ------------------------------------------------------------------------------------------------------------------ No results for input(s): VITAMINB12, FOLATE, FERRITIN, TIBC, IRON, RETICCTPCT in the last 72 hours.  Coagulation profile  Recent Labs Lab 05/19/15 0820  INR 1.48    No results for input(s): DDIMER in the last 72 hours.  Cardiac Enzymes No results for input(s): CKMB, TROPONINI, MYOGLOBIN in the last 168 hours.  Invalid input(s):  CK ------------------------------------------------------------------------------------------------------------------ Invalid input(s): [place]   Recent Labs  05/20/15 2240 05/21/15 0835 05/21/15 0926 05/21/15 1232 05/21/15 1307 05/22/15 0728  GLUCAP 84 26 70 58 88 70     Terrius Gentile M.D. [facility] 05/22/2015, 10:32 AM  Pager: [phone] Between 7am to 7pm - call Pager - [phone]  After 7pm go to [email or web address] - password [email or web address]  Call night coverage person covering after 7pm

## 2015-05-22 NOTE — Care Management Important Message (Signed)
Important Message  Patient Details  Name: Katelind Pytel MRN: [id number] Date of Birth: Mar 06, 1919   Medicare Important Message Given:  Yes-fourth notification given    Camillo Flaming 05/22/2015, 12:22 [place] Message  Patient Details  Name: Jacyln Carmer MRN: [id number] Date of Birth: 11-16-1918   Medicare Important Message Given:  Yes-fourth notification given    Camillo Flaming 05/22/2015, 12:22 PM

## 2015-05-25 ENCOUNTER — Telehealth: Payer: Self-pay | Admitting: *Deleted

## 2015-05-25 NOTE — Telephone Encounter (Signed)
Received FL2 Form from [facility], Social Work Navigator #: [phone] for patient to go into a SNF. Given to Dr. Mariea Clonts to review and sign. ([facility] Faxed)

## 2015-06-02 ENCOUNTER — Encounter (HOSPITAL_COMMUNITY): Payer: Self-pay | Admitting: Emergency Medicine

## 2015-06-02 ENCOUNTER — Emergency Department (HOSPITAL_COMMUNITY): Payer: Medicare Other

## 2015-06-02 ENCOUNTER — Inpatient Hospital Stay (HOSPITAL_COMMUNITY)
Admission: EM | Admit: 2015-06-02 | Discharge: 2015-06-08 | DRG: 280 | Disposition: A | Discharge status: Skilled Nursing Facility | Payer: Medicare Other | Attending: Internal Medicine | Admitting: Internal Medicine

## 2015-06-02 DIAGNOSIS — G629 Polyneuropathy, unspecified: Secondary | ICD-10-CM | POA: Diagnosis present

## 2015-06-02 DIAGNOSIS — Z8249 Family history of ischemic heart disease and other diseases of the circulatory system: Secondary | ICD-10-CM | POA: Diagnosis not present

## 2015-06-02 DIAGNOSIS — K255 Chronic or unspecified gastric ulcer with perforation: Secondary | ICD-10-CM | POA: Diagnosis not present

## 2015-06-02 DIAGNOSIS — M179 Osteoarthritis of knee, unspecified: Secondary | ICD-10-CM | POA: Diagnosis present

## 2015-06-02 DIAGNOSIS — Z882 Allergy status to sulfonamides status: Secondary | ICD-10-CM

## 2015-06-02 DIAGNOSIS — R109 Unspecified abdominal pain: Secondary | ICD-10-CM

## 2015-06-02 DIAGNOSIS — E860 Dehydration: Secondary | ICD-10-CM | POA: Diagnosis present

## 2015-06-02 DIAGNOSIS — Z888 Allergy status to other drugs, medicaments and biological substances status: Secondary | ICD-10-CM | POA: Diagnosis not present

## 2015-06-02 DIAGNOSIS — Z8711 Personal history of peptic ulcer disease: Secondary | ICD-10-CM

## 2015-06-02 DIAGNOSIS — Z803 Family history of malignant neoplasm of breast: Secondary | ICD-10-CM | POA: Diagnosis not present

## 2015-06-02 DIAGNOSIS — F419 Anxiety disorder, unspecified: Secondary | ICD-10-CM | POA: Diagnosis present

## 2015-06-02 DIAGNOSIS — F0280 Dementia in other diseases classified elsewhere without behavioral disturbance: Secondary | ICD-10-CM | POA: Diagnosis present

## 2015-06-02 DIAGNOSIS — E43 Unspecified severe protein-calorie malnutrition: Secondary | ICD-10-CM | POA: Diagnosis not present

## 2015-06-02 DIAGNOSIS — Z833 Family history of diabetes mellitus: Secondary | ICD-10-CM

## 2015-06-02 DIAGNOSIS — J45909 Unspecified asthma, uncomplicated: Secondary | ICD-10-CM | POA: Diagnosis present

## 2015-06-02 DIAGNOSIS — Z853 Personal history of malignant neoplasm of breast: Secondary | ICD-10-CM

## 2015-06-02 DIAGNOSIS — Z880 Allergy status to penicillin: Secondary | ICD-10-CM

## 2015-06-02 DIAGNOSIS — Z681 Body mass index (BMI) 19 or less, adult: Secondary | ICD-10-CM

## 2015-06-02 DIAGNOSIS — Z66 Do not resuscitate: Secondary | ICD-10-CM | POA: Diagnosis present

## 2015-06-02 DIAGNOSIS — I10 Essential (primary) hypertension: Secondary | ICD-10-CM | POA: Diagnosis not present

## 2015-06-02 DIAGNOSIS — Z9012 Acquired absence of left breast and nipple: Secondary | ICD-10-CM | POA: Diagnosis present

## 2015-06-02 DIAGNOSIS — G309 Alzheimer's disease, unspecified: Secondary | ICD-10-CM | POA: Diagnosis present

## 2015-06-02 DIAGNOSIS — Z90710 Acquired absence of both cervix and uterus: Secondary | ICD-10-CM | POA: Diagnosis not present

## 2015-06-02 DIAGNOSIS — Z993 Dependence on wheelchair: Secondary | ICD-10-CM | POA: Diagnosis not present

## 2015-06-02 DIAGNOSIS — Z86010 Personal history of colonic polyps: Secondary | ICD-10-CM | POA: Diagnosis not present

## 2015-06-02 DIAGNOSIS — B9681 Helicobacter pylori [H. pylori] as the cause of diseases classified elsewhere: Secondary | ICD-10-CM | POA: Diagnosis present

## 2015-06-02 DIAGNOSIS — F329 Major depressive disorder, single episode, unspecified: Secondary | ICD-10-CM | POA: Diagnosis present

## 2015-06-02 DIAGNOSIS — Z593 Problems related to living in residential institution: Secondary | ICD-10-CM | POA: Diagnosis not present

## 2015-06-02 DIAGNOSIS — K219 Gastro-esophageal reflux disease without esophagitis: Secondary | ICD-10-CM | POA: Diagnosis present

## 2015-06-02 DIAGNOSIS — I214 Non-ST elevation (NSTEMI) myocardial infarction: Principal | ICD-10-CM

## 2015-06-02 DIAGNOSIS — E785 Hyperlipidemia, unspecified: Secondary | ICD-10-CM | POA: Diagnosis present

## 2015-06-02 DIAGNOSIS — L309 Dermatitis, unspecified: Secondary | ICD-10-CM | POA: Diagnosis present

## 2015-06-02 DIAGNOSIS — K5900 Constipation, unspecified: Secondary | ICD-10-CM | POA: Diagnosis present

## 2015-06-02 DIAGNOSIS — Z79891 Long term (current) use of opiate analgesic: Secondary | ICD-10-CM

## 2015-06-02 DIAGNOSIS — Z79899 Other long term (current) drug therapy: Secondary | ICD-10-CM | POA: Diagnosis not present

## 2015-06-02 DIAGNOSIS — M25562 Pain in left knee: Secondary | ICD-10-CM | POA: Diagnosis not present

## 2015-06-02 DIAGNOSIS — Z886 Allergy status to analgesic agent status: Secondary | ICD-10-CM

## 2015-06-02 DIAGNOSIS — R079 Chest pain, unspecified: Secondary | ICD-10-CM | POA: Diagnosis not present

## 2015-06-02 DIAGNOSIS — R1310 Dysphagia, unspecified: Secondary | ICD-10-CM

## 2015-06-02 DIAGNOSIS — D72829 Elevated white blood cell count, unspecified: Secondary | ICD-10-CM | POA: Diagnosis present

## 2015-06-02 LAB — CBC WITH DIFFERENTIAL/PLATELET
BASOS ABS: 0 10*3/uL (ref 0.0–0.1)
BASOS PCT: 0 % (ref 0–1)
Eosinophils Absolute: 0 10*3/uL (ref 0.0–0.7)
Eosinophils Relative: 0 % (ref 0–5)
HEMATOCRIT: 31.3 % — AB (ref 36.0–46.0)
HEMOGLOBIN: 9.8 g/dL — AB (ref 12.0–15.0)
LYMPHS PCT: 11 % — AB (ref 12–46)
Lymphs Abs: 1.5 10*3/uL (ref 0.7–4.0)
MCH: 30.1 pg (ref 26.0–34.0)
MCHC: 31.3 g/dL (ref 30.0–36.0)
MCV: 96 fL (ref 78.0–100.0)
MONOS PCT: 6 % (ref 3–12)
Monocytes Absolute: 0.9 10*3/uL (ref 0.1–1.0)
NEUTROS ABS: 11.1 10*3/uL — AB (ref 1.7–7.7)
NEUTROS PCT: 83 % — AB (ref 43–77)
Platelets: 692 10*3/uL — ABNORMAL HIGH (ref 150–400)
RBC: 3.26 MIL/uL — ABNORMAL LOW (ref 3.87–5.11)
RDW: 16 % — ABNORMAL HIGH (ref 11.5–15.5)
WBC: 13.6 10*3/uL — ABNORMAL HIGH (ref 4.0–10.5)

## 2015-06-02 LAB — COMPREHENSIVE METABOLIC PANEL
ALBUMIN: 2.4 g/dL — AB (ref 3.5–5.0)
ALK PHOS: 71 U/L (ref 38–126)
ALT: 14 U/L (ref 14–54)
AST: 31 U/L (ref 15–41)
Anion gap: 12 (ref 5–15)
BILIRUBIN TOTAL: 0.8 mg/dL (ref 0.3–1.2)
BUN: 22 mg/dL — AB (ref 6–20)
CALCIUM: 8.3 mg/dL — AB (ref 8.9–10.3)
CO2: 23 mmol/L (ref 22–32)
Chloride: 111 mmol/L (ref 101–111)
Creatinine, Ser: 0.77 mg/dL (ref 0.44–1.00)
GFR calc Af Amer: >60 mL/min (ref >60)
GFR calc non Af Amer: >60 mL/min (ref >60)
GLUCOSE: 101 mg/dL — AB (ref 65–99)
Potassium: 3.5 mmol/L (ref 3.5–5.1)
Sodium: 146 mmol/L — ABNORMAL HIGH (ref 135–145)
TOTAL PROTEIN: 7.6 g/dL (ref 6.5–8.1)

## 2015-06-02 LAB — TROPONIN I
TROPONIN I: 0.27 ng/mL — AB (ref <0.031)
Troponin I: 0.28 ng/mL — ABNORMAL HIGH (ref <0.031)

## 2015-06-02 LAB — PROTIME-INR
INR: 1.08 (ref 0.00–1.49)
PROTHROMBIN TIME: 14.2 s (ref 11.6–15.2)

## 2015-06-02 LAB — I-STAT CG4 LACTIC ACID, ED: LACTIC ACID, VENOUS: 1.47 mmol/L (ref 0.5–2.0)

## 2015-06-02 LAB — APTT: aPTT: 37 seconds (ref 24–37)

## 2015-06-02 MED ORDER — ACETAMINOPHEN 325 MG PO TABS
650.0000 mg | ORAL_TABLET | ORAL | Status: DC | PRN
  Administered 2015-06-04 – 2015-06-07 (×4): 650 mg via ORAL
  Filled 2015-06-02 (×4): qty 2

## 2015-06-02 MED ORDER — ASPIRIN 300 MG RE SUPP: 300.0000 mg | RECTAL | Status: AC

## 2015-06-02 MED ORDER — ONDANSETRON HCL 4 MG/2ML IJ SOLN
4.0000 mg | Freq: Four times a day (QID) | INTRAMUSCULAR | Status: DC | PRN
  Administered 2015-06-03: 4 mg via INTRAVENOUS
  Filled 2015-06-02: qty 2

## 2015-06-02 MED ORDER — ENSURE ENLIVE PO LIQD
237.0000 mL | Freq: Two times a day (BID) | ORAL | Status: DC
  Administered 2015-06-03 – 2015-06-08 (×9): 237 mL via ORAL
  Filled 2015-06-02: qty 237

## 2015-06-02 MED ORDER — GABAPENTIN 100 MG PO CAPS
100.0000 mg | ORAL_CAPSULE | Freq: Three times a day (TID) | ORAL | Status: DC
  Administered 2015-06-02 – 2015-06-08 (×17): 100 mg via ORAL
  Filled 2015-06-02 (×17): qty 1

## 2015-06-02 MED ORDER — TRAMADOL HCL 50 MG PO TABS
50.0000 mg | ORAL_TABLET | Freq: Once | ORAL | Status: DC
  Filled 2015-06-02: qty 1

## 2015-06-02 MED ORDER — ASPIRIN 81 MG PO CHEW
324.0000 mg | CHEWABLE_TABLET | Freq: Once | ORAL | Status: DC
  Filled 2015-06-02: qty 4

## 2015-06-02 MED ORDER — ASPIRIN 81 MG PO CHEW: 324.0000 mg | CHEWABLE_TABLET | ORAL | Status: AC

## 2015-06-02 MED ORDER — TRAMADOL HCL 50 MG PO TABS
50.0000 mg | ORAL_TABLET | Freq: Two times a day (BID) | ORAL | Status: DC | PRN
  Administered 2015-06-02 – 2015-06-07 (×8): 50 mg via ORAL
  Filled 2015-06-02 (×9): qty 1

## 2015-06-02 MED ORDER — PANTOPRAZOLE SODIUM 40 MG PO TBEC
40.0000 mg | DELAYED_RELEASE_TABLET | Freq: Two times a day (BID) | ORAL | Status: DC
  Administered 2015-06-03 – 2015-06-06 (×8): 40 mg via ORAL
  Filled 2015-06-02 (×9): qty 1

## 2015-06-02 MED ORDER — HEPARIN BOLUS VIA INFUSION
3000.0000 [IU] | Freq: Once | INTRAVENOUS | Status: AC
  Administered 2015-06-02: 3000 [IU] via INTRAVENOUS
  Filled 2015-06-02: qty 3000

## 2015-06-02 MED ORDER — SODIUM CHLORIDE 0.9 % IV SOLN
INTRAVENOUS | Status: DC
  Administered 2015-06-02 – 2015-06-03 (×2): via INTRAVENOUS

## 2015-06-02 MED ORDER — HEPARIN (PORCINE) IN NACL 100-0.45 UNIT/ML-% IJ SOLN
600.0000 [IU]/h | INTRAMUSCULAR | Status: DC
  Administered 2015-06-02: 600 [IU]/h via INTRAVENOUS
  Filled 2015-06-02: qty 250

## 2015-06-02 MED ORDER — MEMANTINE HCL ER 28 MG PO CP24
28.0000 mg | ORAL_CAPSULE | Freq: Every day | ORAL | Status: DC
  Administered 2015-06-02 – 2015-06-08 (×7): 28 mg via ORAL
  Filled 2015-06-02 (×7): qty 1

## 2015-06-02 MED ORDER — NITROGLYCERIN 0.4 MG SL SUBL: 0.4000 mg | SUBLINGUAL_TABLET | SUBLINGUAL | Status: DC | PRN

## 2015-06-02 MED ORDER — ASPIRIN 300 MG RE SUPP
300.0000 mg | RECTAL | Status: AC
  Administered 2015-06-02: 300 mg via RECTAL
  Filled 2015-06-02: qty 1

## 2015-06-02 MED ORDER — SODIUM CHLORIDE 0.9 % IV BOLUS (SEPSIS)
500.0000 mL | Freq: Once | INTRAVENOUS | Status: AC
  Administered 2015-06-02: 500 mL via INTRAVENOUS

## 2015-06-02 MED ORDER — ASPIRIN EC 81 MG PO TBEC
81.0000 mg | DELAYED_RELEASE_TABLET | Freq: Every day | ORAL | Status: DC
  Administered 2015-06-03 – 2015-06-08 (×6): 81 mg via ORAL
  Filled 2015-06-02 (×6): qty 1

## 2015-06-02 MED ORDER — HYDRALAZINE HCL 50 MG PO TABS
50.0000 mg | ORAL_TABLET | Freq: Three times a day (TID) | ORAL | Status: DC
  Administered 2015-06-02 – 2015-06-08 (×16): 50 mg via ORAL
  Filled 2015-06-02 (×17): qty 1

## 2015-06-02 MED ORDER — CLOPIDOGREL BISULFATE 75 MG PO TABS
75.0000 mg | ORAL_TABLET | Freq: Every day | ORAL | Status: DC
  Administered 2015-06-03 – 2015-06-08 (×6): 75 mg via ORAL
  Filled 2015-06-02 (×6): qty 1

## 2015-06-02 MED ORDER — ACETAMINOPHEN ER 650 MG PO TBCR: 650.0000 mg | EXTENDED_RELEASE_TABLET | Freq: Three times a day (TID) | ORAL | Status: DC

## 2015-06-02 MED ORDER — ACETAMINOPHEN 500 MG PO TABS
500.0000 mg | ORAL_TABLET | Freq: Three times a day (TID) | ORAL | Status: DC
  Administered 2015-06-02 – 2015-06-08 (×17): 500 mg via ORAL
  Filled 2015-06-02 (×17): qty 1

## 2015-06-02 NOTE — ED Provider Notes (Signed)
CSN: [id number]     Arrival date & time 06/02/15  1053 History   First MD Initiated Contact with Patient 06/02/15 1111     Chief Complaint  Patient presents with  . Fatigue     (Consider location/radiation/quality/duration/timing/severity/associated sxs/prior Treatment) HPI Comments: Patient who was recently admitted to the hospital from 05/11/15-05/22/15 for a perforated Gastric Ulcer presents today with right leg pain.  She had a open repair gastric ulcer and omental patch on 05/04/15.  Patient with a history of Dementia and is a poor historian. Patient is currently complaining of pain of the right leg, but is unable to specify where she is having the pain.  Daughter reports that she has a history of RA and has chronic leg pain.  She has been taking Tylenol for the pain.  No acute injury or trauma.  She is wheelchair bound.   According to the patient's granddaughter who lives with her, the patient was complaining of chest pain this morning around 9 AM.  Patient unable to provide any further information.  When asked about her chest pain, patient denies CP at this time.  Granddaughter reports that the patient was also complaining of abdominal pain yesterday, but has not been complaining of abdominal pain today.  Grand daugther reports that the patient had been constipated, but had a normal BM after taking a stool softener yesterday.  Per grand daughter patient has not had any vomiting or fever.  Chisholm daughter states that a home health RN has been coming into the home to evaluate abdominal incision and reports that it is healing well.  Level V Caveat applies due to history of dementia.    The history is provided by the patient.    Past Medical History  Diagnosis Date  . HYPERLIPIDEMIA 04/16/2007  . ANEMIA-NOS 04/16/2007  . ANXIETY 04/16/2007  . DEPRESSION 09/03/2007  . HYPERTENSION 04/16/2007  . ATHEROSLERO NATV ART EXTREM W/INTERMIT CLAUDICAT 06/05/2010  . ALLERGIC RHINITIS 02/27/2009  . PNEUMONIA,  COMMUNITY ACQUIRED, PNEUMOCOCCAL 12/07/2008  . ASTHMA 09/03/2007  . GERD 04/16/2007  . DIVERTICULOSIS, COLON 09/03/2007  . MENOPAUSAL DISORDER 04/18/2008  . OSTEOARTHRITIS, KNEE, LEFT 11/24/2009  . JOINT EFFUSION, LEFT KNEE 02/27/2009  . KNEE PAIN, BILATERAL 06/01/2010  . LOW BACK PAIN 04/16/2007  . LEG PAIN, LEFT 06/01/2010  . WEIGHT LOSS 07/25/2008  . RASH-NONVESICULAR 07/25/2008  . Abdominal pain, generalized 09/03/2007  . BREAST CANCER, HX OF 04/16/2007  . COLONIC POLYPS, HX OF 09/03/2007  . Dementia 06/17/2012    mild  . Peripheral neuropathy 06/17/2012   Past Surgical History  Procedure Laterality Date  . Abdominal hysterectomy  1966  . Tubal ligation    . S/p left mastectomy  2011  . Laparoscopy N/A 05/11/2015    Procedure: LAPAROSCOPY DIAGNOSTIC converted open repair gastric ulcerand omental patch;  Surgeon: Leighton Ruff, MD;  Location: [facility];  Service: General;  Laterality: N/A;   Family History  Problem Relation Age of Onset  . Cancer Sister     Breast  . Hypertension Other   . Diabetes Other     1st degree relative  . Hypertension Maternal Grandmother    Social History  Substance Use Topics  . Smoking status: Never Smoker   . Smokeless tobacco: Never Used  . Alcohol Use: No   OB History    No data available     Review of Systems  Unable to perform ROS: Dementia      Allergies  Nsaids; Amlodipine besylate; Benzodiazepines; Penicillins; and  Sulfonamide derivatives  Home Medications   Prior to Admission medications   Medication Sig Start Date End Date Taking? Authorizing Provider  acetaminophen (TYLENOL) 650 MG CR tablet Take 650 mg by mouth as needed for pain.    Historical Provider, MD  AMBULATORY NON FORMULARY MEDICATION Wheelchair and Cushion 10/18/14   Tiffany L Reed, DO  cetirizine (ZYRTEC) 10 MG tablet Take 1 tablet (10 mg total) by mouth daily. 06/02/14   Tiffany L Reed, DO  gabapentin (NEURONTIN) 100 MG capsule Take 1 capsule (100 mg total) by mouth 3  (three) times daily. 06/17/12   Biagio Borg, MD  hydrALAZINE (APRESOLINE) 50 MG tablet Take one tablet by mouth four times daily for blood pressure 01/23/15   Tiffany L Reed, DO  levofloxacin (LEVAQUIN) 750 MG tablet Take 1 tablet (750 mg total) by mouth every other day. X 6 doses 05/23/15   Ripudeep Krystal Eaton, MD  Memantine HCl ER 28 MG CP24 Take 28 mg by mouth daily. 04/18/14   Tiffany L Reed, DO  pantoprazole (PROTONIX) 40 MG tablet Take 1 tablet (40 mg total) by mouth 2 (two) times daily before a meal. 05/22/15   Emina Riebock, NP  Tdap (BOOSTRIX) 5-2.5-18.5 LF-MCG/0.5 injection Inject 0.5 mLs into the muscle once. 04/17/15   Tiffany L Reed, DO  traMADol (ULTRAM) 50 MG tablet Take 1 tablet (50 mg total) by mouth every 12 (twelve) hours as needed for moderate pain or severe pain. 05/22/15   Emina Riebock, NP   BP 109/72 mmHg  Pulse 89  Temp(Src) 98.8 F (37.1 C) (Oral)  Resp 18  SpO2 96% Physical Exam  Constitutional: She appears well-developed and well-nourished.  HENT:  Head: Normocephalic and atraumatic.  Mouth/Throat: Oropharynx is clear and moist.  Eyes: EOM are normal. Pupils are equal, round, and reactive to light.  Neck: Normal range of motion. Neck supple.  Cardiovascular: Normal rate, regular rhythm and normal heart sounds.   Pulmonary/Chest: Effort normal and breath sounds normal.  Abdominal: Soft. Bowel sounds are normal. She exhibits no distension and no mass. There is no tenderness. There is no rebound and no guarding.    Musculoskeletal: She exhibits no edema.  Decreased ROM of LE bilaterally, which grand daughter reports is baseline.  Neurological: She is alert.  Skin: Skin is warm and dry.  Small pressure ulcer of the sacrum.  No drainage.  No surrounding erythema.  Psychiatric: She has a normal mood and affect.  Nursing note and vitals reviewed.   ED Course  Procedures (including critical care time) Labs Review Labs Reviewed  CBC WITH DIFFERENTIAL/PLATELET    COMPREHENSIVE METABOLIC PANEL  TROPONIN I  URINALYSIS, ROUTINE W REFLEX MICROSCOPIC (NOT AT [facility])    Imaging Review No results found. I have personally reviewed and evaluated these images and lab results as part of my medical decision-making.   EKG Interpretation   Date/Time:  Friday June 02 2015 13:18:24 EDT Ventricular Rate:  93 PR Interval:  116 QRS Duration: 120 QT Interval:  402 QTC Calculation: 500 R Axis:   14 Text Interpretation:  Sinus rhythm Borderline short PR interval LVH with  IVCD and secondary repol abnrm Borderline prolonged QT interval new  significant ST depression/TWI diffusely  Confirmed by Wilson Singer  MD, STEPHEN  ([id number]) on 06/02/2015 1:28:51 PM     1:50 PM Discussed with Cardiology.  They report that they will come see the patient in the ED. 2:12 PM Discussed with General Surgery.  They state that the patient can  be placed on Heparin.  3:55 PM Patient discussed with Dr. Laverle Patter with [facility] who has agreed to admit the patient at [facility].  CRITICAL CARE Performed by: Hyman Bible   Total critical care time: 30  Critical care time was exclusive of separately billable procedures and treating other patients.  Critical care was necessary to treat or prevent imminent or life-threatening deterioration.  Critical care was time spent personally by me on the following activities: development of treatment plan with patient and/or surrogate as well as nursing, discussions with consultants, evaluation of patient's response to treatment, examination of patient, obtaining history from patient or surrogate, ordering and performing treatments and interventions, ordering and review of laboratory studies, ordering and review of radiographic studies, pulse oximetry and re-evaluation of patient's condition.  MDM   Final diagnoses:  None    Patient with a recent open repair with omental patch for a perforated Gastric Ulcer presents today with  complaining of pain of her right leg.  Granddaughter who lives with her reports that this pain is chronic.  No acute injury or trauma.  Xray negative for acute findings.  However, Granddaughter reports that the patient was complaining of chest pain earlier this morning.  Troponin found to be elevated at 0.27.  EKG with new t wave inversion and ST depression of V3-V6.  Cardiology consulted and recommended medical management with ASA and Heparin.  Cardiology recommended admission to [facility] for further management.  Dr Laverle Patter with [facility] agreed to the admission.     Hyman Bible, PA-C 06/03/15 [place], MD 06/04/15 1044

## 2015-06-02 NOTE — ED Notes (Signed)
Bed: WA12 Expected date:  Expected time:  Means of arrival:  Comments: Ems 

## 2015-06-02 NOTE — H&P (Addendum)
[facility] History and Physical  Quincy Prisco FWY:[id number] DOB: Apr 23, 1919 DOA: 06/02/2015  Referring physician: ED PCP: Hollace Kinnier, DO   Chief Complaint:  Chest pain   History provided by family as patient is serially demented.  HPI:  81 year old female with severe Alzheimer's dementia with history of hypertension, hyperlipidemia, recent perforated gastric ulcer repaired with an omental patch on 05/11/2015, anxiety and depression, osteoarthritis who brought to the ED by family after patient complained of chest pain and right leg pain. Patient at baseline is wheelchair-bound and daughter denies any fall or trauma. As per family patient complaining of chest pain and abdominal pain yesterday...   No history of headache, fevers, chills, nausea, vomiting, shortness of breath, dysuria or diarrhea. As per family patient has been constipated.  Course in the ED  Vitals were stable. Blood will done showed mild leukocytosis, hemoglobin of 9.8, platelets of 692. Chemistry showed sodium of 146, potassium 3.5, BN of 22 0.77. Troponin was elevated 0.27. EKG showed normal sinus rhythm with ST depression in anterior leads and deep T-wave inversion in inferior and lateral leads concerning for NSTEMI.  Head CT was unremarkable for acute abnormality. X-ray of the right hip was negative for any fracture. Chest x-ray was unremarkable.  Cardiology consulted who recommended medical management given her advanced age and dementia with poor functional status. Recommended starting on IV heparin if approved by surgery. ED physician spoke with general surgery who approved of placing patient on IV heparin. Patient started on IV heparin and hospitalist admission requested to telemetry.   Review of Systems:  As outlined in history of present illness. 12 point review of systems Limited due to patient's severe dementia.   Past Medical History  Diagnosis Date  . HYPERLIPIDEMIA 04/16/2007  . ANEMIA-NOS  04/16/2007  . ANXIETY 04/16/2007  . DEPRESSION 09/03/2007  . HYPERTENSION 04/16/2007  . ATHEROSLERO NATV ART EXTREM W/INTERMIT CLAUDICAT 06/05/2010  . ALLERGIC RHINITIS 02/27/2009  . PNEUMONIA, COMMUNITY ACQUIRED, PNEUMOCOCCAL 12/07/2008  . ASTHMA 09/03/2007  . GERD 04/16/2007  . DIVERTICULOSIS, COLON 09/03/2007  . MENOPAUSAL DISORDER 04/18/2008  . OSTEOARTHRITIS, KNEE, LEFT 11/24/2009  . JOINT EFFUSION, LEFT KNEE 02/27/2009  . KNEE PAIN, BILATERAL 06/01/2010  . LOW BACK PAIN 04/16/2007  . LEG PAIN, LEFT 06/01/2010  . WEIGHT LOSS 07/25/2008  . RASH-NONVESICULAR 07/25/2008  . Abdominal pain, generalized 09/03/2007  . BREAST CANCER, HX OF 04/16/2007  . COLONIC POLYPS, HX OF 09/03/2007  . Dementia 06/17/2012    mild  . Peripheral neuropathy 06/17/2012   Past Surgical History  Procedure Laterality Date  . Abdominal hysterectomy  1966  . Tubal ligation    . S/p left mastectomy  2011  . Laparoscopy N/A 05/11/2015    Procedure: LAPAROSCOPY DIAGNOSTIC converted open repair gastric ulcerand omental patch;  Surgeon: Leighton Ruff, MD;  Location: [facility];  Service: General;  Laterality: N/A;   Social History:  reports that she has never smoked. She has never used smokeless tobacco. She reports that she does not drink alcohol or use illicit drugs.  Allergies  Allergen Reactions  . Nsaids Other (See Comments)    Perforated gastric ulcer  . Amlodipine Besylate     REACTION: rash and itch  . Benzodiazepines Other (See Comments)    Hx of overuse/memory issue  . Penicillins   . Sulfonamide Derivatives     Family History  Problem Relation Age of Onset  . Cancer Sister     Breast  . Hypertension Other   . Diabetes Other  1st degree relative  . Hypertension Maternal Grandmother     Prior to Admission medications   Medication Sig Start Date End Date Taking? Authorizing Provider  acetaminophen (TYLENOL) 650 MG CR tablet Take 650 mg by mouth daily as needed for pain.    Yes Historical Provider, MD   AMBULATORY NON FORMULARY MEDICATION Wheelchair and Cushion 10/18/14  Yes Tiffany L Reed, DO  cetirizine (ZYRTEC) 10 MG tablet Take 1 tablet (10 mg total) by mouth daily. Patient taking differently: Take 10 mg by mouth daily as needed for allergies.  06/02/14  Yes Tiffany L Reed, DO  gabapentin (NEURONTIN) 100 MG capsule Take 1 capsule (100 mg total) by mouth 3 (three) times daily. 06/17/12  Yes Biagio Borg, MD  hydrALAZINE (APRESOLINE) 50 MG tablet Take one tablet by mouth four times daily for blood pressure Patient taking differently: Take 50 mg by mouth 4 (four) times daily.  01/23/15  Yes Tiffany L Reed, DO  Memantine HCl ER 28 MG CP24 Take 28 mg by mouth daily. 04/18/14  Yes Tiffany L Reed, DO  pantoprazole (PROTONIX) 40 MG tablet Take 1 tablet (40 mg total) by mouth 2 (two) times daily before a meal. Patient taking differently: Take 40 mg by mouth 2 (two) times daily as needed (acid reflux).  05/22/15  Yes Emina Riebock, NP  traMADol (ULTRAM) 50 MG tablet Take 1 tablet (50 mg total) by mouth every 12 (twelve) hours as needed for moderate pain or severe pain. 05/22/15  Yes Emina Riebock, NP  levofloxacin (LEVAQUIN) 750 MG tablet Take 1 tablet (750 mg total) by mouth every other day. X 6 doses Patient not taking: Reported on 06/02/2015 05/23/15   Ripudeep Krystal Eaton, MD  Tdap (BOOSTRIX) 5-2.5-18.5 LF-MCG/0.5 injection Inject 0.5 mLs into the muscle once. Patient not taking: Reported on 06/02/2015 04/17/15   Gayland Curry, DO     Physical Exam:  Filed Vitals:   06/02/15 1430 06/02/15 1500 06/02/15 1539 06/02/15 1630  BP: 150/53 147/129 151/54 158/59  Pulse: 90 93 85   Temp:      TempSrc:      Resp: 32 24 24 26   SpO2: 94% 96% 97%     Constitutional: Vital signs reviewed.  Elderly female in no acute distress HEENT: No pallor, moist oral mucosa, temporal wasting,, no cervical lymphadenopathy, supple neck Cardiovascular: Normal S1 and S2, no murmurs rub or gallop Chest: CTAB, no wheezes, rales, or  rhonchi Abdominal: Soft. Non-tender, non-distended, bowel sounds are normal,  Ext: warm, no edema Neurological: A&O x0, severely demented  Labs on Admission:  Basic Metabolic Panel:  Recent Labs Lab 06/02/15 1220  NA 146*  K 3.5  CL 111  CO2 23  GLUCOSE 101*  BUN 22*  CREATININE 0.77  CALCIUM 8.3*   Liver Function Tests:  Recent Labs Lab 06/02/15 1220  AST 31  ALT 14  ALKPHOS 71  BILITOT 0.8  PROT 7.6  ALBUMIN 2.4*   No results for input(s): LIPASE, AMYLASE in the last 168 hours. No results for input(s): AMMONIA in the last 168 hours. CBC:  Recent Labs Lab 06/02/15 1220  WBC 13.6*  NEUTROABS 11.1*  HGB 9.8*  HCT 31.3*  MCV 96.0  PLT 692*   Cardiac Enzymes:  Recent Labs Lab 06/02/15 1220  TROPONINI 0.27*   BNP: Invalid input(s): POCBNP CBG: No results for input(s): GLUCAP in the last 168 hours.  Radiological Exams on Admission: Dg Chest 2 View  06/02/2015   CLINICAL DATA:  Altered  mental status. History of breast carcinoma. Hypertension.  EXAM: CHEST  2 VIEW  COMPARISON:  May 19, 2015  FINDINGS: There is no edema or consolidation. Heart size and pulmonary vascularity are normal. No adenopathy. There is atherosclerotic calcification in the aorta. There is a surgical clip in the left hemi thorax region. No bone lesions are appreciable. No pneumothorax.  IMPRESSION: No edema or consolidation.   Electronically Signed   By: Lowella Grip III M.D.   On: 06/02/2015 13:19   Ct Head Wo Contrast  06/02/2015   CLINICAL DATA:  Confusion.  History of dementia  EXAM: CT HEAD WITHOUT CONTRAST  TECHNIQUE: Contiguous axial images were obtained from the base of the skull through the vertex without intravenous contrast.  COMPARISON:  October 01, 2011  FINDINGS: There is persistent frontal and temporal lobe atrophy bilaterally. The parietal and occipital lobes do not show appreciable atrophy. Ventricles are enlarged in a generalized manner, stable. There is no  intracranial mass, hemorrhage, extra-axial fluid collection, or midline shift. There is patchy small vessel disease in the centra semiovale, likely due to small vessel vascular disease as well as a potential degree of interstitial edema given the degree of ventricular enlargement present. No new gray-white compartment lesions. No acute infarct evident. Bony calvarium appears intact. The mastoid air cells are clear.  IMPRESSION: Stable appearance compared to the 2013 examination. Evidence of frontal and temporal lobar degeneration. Given the overall appearance of the ventricles, there may be a degree of underlying communicating hydrocephalus. There is probable periventricular small vessel disease, likely with superimposed interstitial edema due to the enlarged ventricles. No acute infarct evident. No hemorrhage or mass effect.   Electronically Signed   By: Lowella Grip III M.D.   On: 06/02/2015 15:27   Dg Hip Unilat With Pelvis 2-3 Views Right  06/02/2015   CLINICAL DATA:  Pain.  No known trauma.  EXAM: DG HIP (WITH OR WITHOUT PELVIS) 2-3V RIGHT  COMPARISON:  None.  FINDINGS: Frontal pelvis as well as frontal and lateral right hip images obtained. There is moderate narrowing of both hip joints. No acute fracture or dislocation. No erosive change.  IMPRESSION: Narrowing both hip joints.  No fracture or dislocation.   Electronically Signed   By: Lowella Grip III M.D.   On: 06/02/2015 13:18    EKG: Normal sinus rhythm with ST depression with deep T-wave inversion in anterior lead, ST depression in Inferior leads and T-wave inversion in lateral leads  Assessment/Plan  Principal Problem:   NSTEMI (non-ST elevated myocardial infarction) Admit to telemetry. Started on IV heparin drip after cleared by surgery (given recent perforated peptic ulcer with repair) -Monitor serial cardiac enzymes and EKG. Started on aspirin and Plavix. Will add low-dose beta blocker. Order 2-D echo. -Appreciate cardiology  follow-up. Sublingual nitroglycerin when necessary for chest pain symptoms.  Active Problems:   Essential hypertension Resume home dose hydralazine. Will add low dose beta blocker. Hold off on ACE inhibitor at this time.     GERD with ulcerated gastric ulcer status post open omental patch on 05/11/2015 Currently stable. Place on PPI twice a day. Monitor H&H    Severe protein-calorie malnutrition    Alzheimer's disease with severe dementia Continue Namenda  Leukocytosis No clinical sign of infection. Also appears dehydrated clinically. Check UA. Place on gentle hydration.      Diet:cardiac  DVT prophylaxis: IV heparin   Code Status: DO NOT RESUSCITATE (after discussing in detail with patient's daughter [name] on the phone) Family  Communication: Discussed in detail with patient's daughter on the phone. Discussed that her overall prognosis is guarded and daughter understands. Disposition Plan: Admit to telemetry  Elaysia Devargas, [facility] Pager [phone]  Total time spent on admission :70 minutes  If 7PM-7AM, please contact night-coverage [email or web address] Password [email or web address] 06/02/2015, 5:20 PM

## 2015-06-02 NOTE — ED Notes (Signed)
EMS from home, vagues, non-specific complaints, recent surgery, VSS, denies pain, non-ambulatory at baseline and hx of dementia, is at neuro baseline

## 2015-06-02 NOTE — ED Notes (Signed)
Pt's daughter, Amariana Mirando, [place], [phone]

## 2015-06-02 NOTE — Consult Note (Signed)
CARDIOLOGY CONSULT NOTE   Patient ID: [facility] MRN: [id number], DOB/AGE: February 24, 1919   Admit date: 06/02/2015 Date of Consult: 06/02/2015   Primary Physician: Elizabeth Kinnier, DO Primary Cardiologist: new  Pt. Profile  severely demented 81 year old African-American female with past medical history of hypertension, hyperlipidemia and significant dementia who recently underwent open gastric repair presented with lack of appetitie, chest pain and LE pain. Her trop is elevated at 0.2, EKG showed significant ST depression with deep TWI in anterior lead, also had mild ST depression in inferior lead  Problem List  Past Medical History  Diagnosis Date  . HYPERLIPIDEMIA 04/16/2007  . ANEMIA-NOS 04/16/2007  . ANXIETY 04/16/2007  . DEPRESSION 09/03/2007  . HYPERTENSION 04/16/2007  . ATHEROSLERO NATV ART EXTREM W/INTERMIT CLAUDICAT 06/05/2010  . ALLERGIC RHINITIS 02/27/2009  . PNEUMONIA, COMMUNITY ACQUIRED, PNEUMOCOCCAL 12/07/2008  . ASTHMA 09/03/2007  . GERD 04/16/2007  . DIVERTICULOSIS, COLON 09/03/2007  . MENOPAUSAL DISORDER 04/18/2008  . OSTEOARTHRITIS, KNEE, LEFT 11/24/2009  . JOINT EFFUSION, LEFT KNEE 02/27/2009  . KNEE PAIN, BILATERAL 06/01/2010  . LOW BACK PAIN 04/16/2007  . LEG PAIN, LEFT 06/01/2010  . WEIGHT LOSS 07/25/2008  . RASH-NONVESICULAR 07/25/2008  . Abdominal pain, generalized 09/03/2007  . BREAST CANCER, HX OF 04/16/2007  . COLONIC POLYPS, HX OF 09/03/2007  . Dementia 06/17/2012    mild  . Peripheral neuropathy 06/17/2012    Past Surgical History  Procedure Laterality Date  . Abdominal hysterectomy  1966  . Tubal ligation    . S/p left mastectomy  2011  . Laparoscopy N/A 05/11/2015    Procedure: LAPAROSCOPY DIAGNOSTIC converted open repair gastric ulcerand omental patch;  Surgeon: Elizabeth Ruff, MD;  Location: [facility];  Service: General;  Laterality: N/A;     Allergies  Allergies  Allergen Reactions  . Nsaids Other (See Comments)    Perforated gastric ulcer  . Amlodipine  Besylate     REACTION: rash and itch  . Benzodiazepines Other (See Comments)    Hx of overuse/memory issue  . Penicillins   . Sulfonamide Derivatives     HPI   The patient is a severely demented 81 year old African-American female with past medical history of hypertension, hyperlipidemia and significant dementia. Of note, family is not at bedside, and the patient is alert but not oriented and unable to tell me anything. I have attempted to call the patient's granddaughter to collect in more information, however unable to reach her both via home phone or cell phone. Apparently, patient was sent over to [facility] by EMS from home due to vague nonspecific complaints. Family states this is her baseline mental status despite medical records indicate she has only mild dementia. She is unable to tell me location, year, president, and point to the 81 year old nursing staff at the bedside calling him her husband.   She was last admitted from 8/18-8/29. She was initially seen in the ED on 05/11/2015, at which time she complained of moderate generalized abdominal pain which started several weeks ago. Exam showed rebound tenderness and guarding. On initial arrival, she had venous lactic acid of 8.31. CT of the abdomen obtained on 8/18 showed perforated bowel with large pneumoperitoneum with small ascites. She was admitted by general surgery team who decided to undergo laproscopic exam knowing high risk procedure given her comorbidities after discussing with family and patient. She underwent the procedure on the same day which revealed perforated prepyloric gastric ulcer. She was converted to open repair gastric ulcer with omental patch.  She had postoperative anemia and required 2 units PRBC. Postprocedure, her hospitalization was complicated by hypotension and acute kidney injury both of which improved with IV hydration. She also had worsening mental status deterioration superimposed on her dementia. She was  treated with antibiotic for peritonitis. Palliative care was consulted on 8/22 to help establish goals care at which time she was still full code. She was later found to have moderate pleural effusion, she underwent right sided 400 mL clear thoracentesis that was felt to be transudative process. Cytology showed inflammatory and scattered atypical cells and reactive mesothelial cell present. She continued to have elevated white cell count and occasional fever, her antibiotic was adjusted and infectious disease was consulted on 8/26. Repeat abdominal CT did not show evidence of intra-abdominal fluid collection. Antibiotic was later changed to Levaquin for suspected aspiration pneumonia. She had a swallowing eval and the speech therapy recommended D3 diet, which was eventually advanced after UGI was negative for leak and ileus resolved. On discharge, she has been advised to take PPI for at least 6 weeks with potential EGD in 6-8 weeks if her condition does not improve to make sure ulcer has healed.   She presented to [facility] on 06/02/2015, per nursing staff, family claimed that she had chest discomfort. However during my interview, she was too demented to provide any reasonable history. She seems to be more concerned of lower leg pain and abdominal pain. Significant laboratory finding include troponin of 0.27, white blood cell count 13.6, hemoglobin 9.8, lactic acid 1.47. Creatinine 0.77. Chest x-ray is negative for acute process. Hip x-ray was negative. EKG showed significant T wave inversion in the anterior lead with some degree of ST depression in V5 through V6. Patient also has mild ST depression in the inferior lead as well. This is significantly changed from compared to her previous EKG on 05/11/2015. Cardiology has been consulted for elevated troponin.  Addendum: I finally was able to reach the patient's sister and nephew, per patient's nephew, the reason for admission as she has not been eating  very well for the past day, she also complained of chronic leg pain, she also had new onset of chest discomfort this morning. Her power of attorney is her daughter [facility]. Per family member, they were not aware of any prior DNR/DNI, therefore she is still full code.   Inpatient Medications  . aspirin  324 mg Oral Once  . heparin  3,000 Units Intravenous Once  . traMADol  50 mg Oral Once    Family History Family History  Problem Relation Age of Onset  . Cancer Sister     Breast  . Hypertension Other   . Diabetes Other     1st degree relative  . Hypertension Maternal Grandmother      Social History Social History   Social History  . Marital Status: Widowed    Spouse Name: N/A  . Number of Children: 6  . Years of Education: N/A   Occupational History  . Retired Nurse, adult work    Social History Main Topics  . Smoking status: Never Smoker   . Smokeless tobacco: Never Used  . Alcohol Use: No  . Drug Use: No  . Sexual Activity: Not on file   Other Topics Concern  . Not on file   Social History Narrative   Lives with her sister.  Ambulates with a cane, but has not been able to ambulate over the past 2 weeks.     Review  of Systems  Patient has significant dementia, unable to recall most of history.  Per family, patient had chest pain started this morning, loss of appetite for the past day, chronic lower extremity pain. Denies any recent fever, chill, cough. Has chronic mental status abnormality. Patient is bedbound.   Physical Exam  Blood pressure 148/63, pulse 92, temperature 98.8 F (37.1 C), temperature source Oral, resp. rate 24, SpO2 99 %.  General: NAD Psych: flat affect Neuro: Alert, but not oriented.  HEENT: Normal  Neck: Supple without bruits or JVD. Lungs:  Resp regular and unlabored, CTA. Heart: RRR no s3, s4, or murmurs. Abdomen: Midline incision noted, well-healed, no drainage, no redness. Extremities: No significant edema in the lower  extremity, patient complaining of pain when touching her lower leg. 2 lower legs appears to be abnormal position.  Labs   Recent Labs  06/02/15 1220  TROPONINI 0.27*   Lab Results  Component Value Date   WBC 13.6* 06/02/2015   HGB 9.8* 06/02/2015   HCT 31.3* 06/02/2015   MCV 96.0 06/02/2015   PLT 692* 06/02/2015    Recent Labs Lab 06/02/15 1220  NA 146*  K 3.5  CL 111  CO2 23  BUN 22*  CREATININE 0.77  CALCIUM 8.3*  PROT 7.6  BILITOT 0.8  ALKPHOS 71  ALT 14  AST 31  GLUCOSE 101*   Lab Results  Component Value Date   CHOL 185 12/02/2014   HDL 52 12/02/2014   LDLCALC 116* 12/02/2014   TRIG 85 12/02/2014   No results found for: DDIMER  Radiology/Studies  Ct Abdomen Pelvis Wo Contrast  05/18/2015   CLINICAL DATA:  Leukocytosis. Laparoscopic gastric ulcer surgery 05/11/2015 with persistent abdominal pain.  EXAM: CT ABDOMEN AND PELVIS WITHOUT CONTRAST  TECHNIQUE: Multidetector CT imaging of the abdomen and pelvis was performed following the standard protocol without IV contrast.  COMPARISON:  05/11/2015  FINDINGS: Lung bases demonstrate worsening moderate size right effusion and small left effusion with associated compressive atelectasis in the right base. Mild stable cardiomegaly with atherosclerotic coronary artery disease unchanged. Central venous line seen with tip over the cavoatrial junction.  Abdominal images demonstrate the stomach to be mildly distended with air and minimal wall thickening over the distal stomach/ proximal duodenum likely postsurgical. This mild wall thickening/ edema could be causing a degree of obstruction. No evidence of free peritoneal air. Small amount of perihepatic fluid. Minimal density just below the splenic flexure likely postsurgical.  There are a couple sub cm stable liver hypodensities likely cysts. Suggestion mild contrast excretion within the gallbladder. The spleen, pancreas and adrenal glands are within normal. Kidneys normal in size  without nephrolithiasis. There is subtle symmetric prominence of the intrarenal collecting systems. Appendix not seen.  Contrast and air are present throughout the colon. There is mild diverticulosis over the sigmoid colon. There are several minimally dilated small bowel loops likely postoperative ileus.  Abdominal wall incision anteriorly in the midline. Bilateral subcutaneous edema over the abdominal wall and pelvic soft tissues.  Calcified plaque of the abdominal aorta and iliac arteries.  Pelvic images demonstrate a Foley catheter present within a decompressed bladder. There is mild fecal retention over the rectum. There is a small amount of free pelvic fluid present.  There are degenerative changes of the spine with multilevel disc disease over the lumbar spine and subtle grade 1 anterolisthesis of L3 on L4 and L4 on L5 unchanged. Mild degenerate change of the hips.  IMPRESSION: Mild gaseous distention of the  stomach with minimal wall thickening over the distal stomach/ proximal duodenum likely postsurgical edema and may be causing a mild degree of obstruction. Several mildly dilated small bowel loops likely postoperative ileus. Small amount of peritoneal fluid likely postsurgical. No free peritoneal air.  Worsening moderate size right effusion and small left effusion with associated compressive atelectasis in the right base.  Mild diverticulosis of the sigmoid colon.  Two small subcentimeter liver hypodensities unchanged likely cysts.  Stable cardiomegaly and atherosclerotic coronary artery disease.  Other stable chronic finding as described.   Electronically Signed   By: Marin Olp M.D.   On: 05/18/2015 14:25   Ct Abdomen Pelvis Wo Contrast  05/11/2015   CLINICAL DATA:  Generalized abdominal pain, worse last night.  EXAM: CT ABDOMEN AND PELVIS WITHOUT CONTRAST  TECHNIQUE: Multidetector CT imaging of the abdomen and pelvis was performed following the standard protocol without IV contrast.  COMPARISON:   None.  FINDINGS: BODY WALL: No contributory findings.  LOWER CHEST: Small bilateral pleural effusion with right basilar atelectasis. Small lower pneumomediastinum through hiatal hernia.  ABDOMEN/PELVIS:  Liver: Few sub cm low densities favor cysts.  Biliary: Haziness of fat around the gallbladder is likely secondary. No primary inflammation suspected.  Pancreas: Unremarkable.  Spleen: Unremarkable.  Adrenals: Unremarkable.  Kidneys and ureters: Mild bilateral renal cortical atrophy. No hydronephrosis.  Bladder: Distended without wall thickening.  Reproductive: Hysterectomy.  No adnexal mass.  Bowel: There is a large volume of pneumoperitoneum distributed throughout the abdomen. Given the large volume, distribution is not helpful in identifying a source. Small volume ascites accompanies the fluid, with probable peritoneal thickening in the pelvic recesses. The source of perforation is uncertain; there is no definitive pneumatosis or ulceration. No inflammatory wall thickening around the gastric antrum or duodenum. The cecum is gas filled and ventrally rotated, but there is no bascule or volvulus and no associated pneumatosis. Oral contrast reaches the third portion duodenum and there is no contrast extravasation.  Vascular: No acute abnormality.  OSSEOUS: No acute abnormalities.  These results were called by telephone at the time of interpretation on 05/11/2015 at 5:02 am to Dr. Julianne Rice , who verbally acknowledged these results.  IMPRESSION: 1. Perforated bowel with large pneumoperitoneum and small ascites. The site of perforation is uncertain; no extravasation of oral contrast which reaches the third portion duodenum. Colonic diverticulosis is present. 2. Atelectasis and small bilateral pleural effusion.   Electronically Signed   By: Monte Fantasia M.D.   On: 05/11/2015 05:07   Dg Chest 2 View  06/02/2015   CLINICAL DATA:  Altered mental status. History of breast carcinoma. Hypertension.  EXAM: CHEST  2  VIEW  COMPARISON:  May 19, 2015  FINDINGS: There is no edema or consolidation. Heart size and pulmonary vascularity are normal. No adenopathy. There is atherosclerotic calcification in the aorta. There is a surgical clip in the left hemi thorax region. No bone lesions are appreciable. No pneumothorax.  IMPRESSION: No edema or consolidation.   Electronically Signed   By: Lowella Grip III M.D.   On: 06/02/2015 13:19   Dg Chest 2 View  05/18/2015   CLINICAL DATA:  Leukocytosis. No chest complaints. History of hypertension, asthma, dementia.  EXAM: CHEST  2 VIEW  COMPARISON:  05/16/2015 and earlier  FINDINGS: Right-sided PICC line tip overlies the level of the right atrium. Right scratched of bilateral pleural effusions are present, right greater than left. Bilateral lung opacities obscure the hemidiaphragms bilaterally, right greater than left. Little change  since prior study. Residual contrast identified in the colon. Cardiomegaly.  IMPRESSION: 1. Persistent bilateral infiltrates, right greater than left. 2. Bilateral pleural effusions.   Electronically Signed   By: Nolon Nations M.D.   On: 05/18/2015 13:47   Dg Chest Port 1 View  05/19/2015   CLINICAL DATA:  81 year old female with right pleural effusion -status post thoracentesis.  EXAM: PORTABLE CHEST - 1 VIEW  COMPARISON:  05/19/2015 and prior exams  FINDINGS: Right lower lung atelectasis and probable right pleural effusion noted.  There is no evidence of pneumothorax.  Cardiomegaly and right PICC line with tip overlying the superior cavoatrial junction again noted.  Mild left basilar atelectasis is noted.  IMPRESSION: Little significant change in appearance of the chest. No evidence of pneumothorax identified on the study.  Bilateral lower lung atelectasis, right greater than left, and probable right pleural effusion again noted.   Electronically Signed   By: Margarette Canada M.D.   On: 05/19/2015 15:31   Dg Chest Port 1 View  05/19/2015   CLINICAL  DATA:  Status post thoracentesis right side. History of asthma, hypertension, pneumonia, left mastectomy.  EXAM: PORTABLE CHEST - 1 VIEW  COMPARISON:  05/18/2015  FINDINGS: Right-sided PICC line tip overlies the level of the lower superior vena cava, unchanged. There continues be dense opacification at the right lung base obscuring the hemidiaphragm. Horizontal level of the pleural effusion raises the question of air within the pleural space although no definite pneumothorax can be identified. There is persistent opacity in the left lung base as well. EKG lead overlies the left lung apex. Residual contrast is seen the colon.  IMPRESSION: 1. Status post right pneumothorax scratched a status post right thoracentesis. 2. Question of small right pneumothorax.  Follow-up is recommended. The salient findings were discussed with Noe Gens on 05/19/2015 at 1:41 pm.   Electronically Signed   By: Nolon Nations M.D.   On: 05/19/2015 13:41   Dg Chest Port 1 View  05/16/2015   CLINICAL DATA:  Increasing short of breath  EXAM: PORTABLE CHEST - 1 VIEW  COMPARISON:  05/14/2015  FINDINGS: The heart is moderately enlarged. Pulmonary edema has developed. Opacity at the right base is stable likely a combination of volume loss, consolidation, and pleural fluid. Atelectasis at the left base is stable. Tiny left pleural effusion is not excluded.  IMPRESSION: New onset of pulmonary edema. Bibasilar atelectasis and right pleural effusion are stable.   Electronically Signed   By: Marybelle Killings M.D.   On: 05/16/2015 08:19   Dg Chest Port 1 View  05/14/2015   CLINICAL DATA:  Shortness of breath  EXAM: PORTABLE CHEST - 1 VIEW  COMPARISON:  05/12/2015  FINDINGS: Nasogastric tube tip has been removed. Dilated colon again seen in the left abdomen.  Bilateral basilar opacification, greater on the right. Appearance suggests associated pleural fluid. Unchanged cardiopericardial enlargement. Stable upper mediastinal contours.  IMPRESSION:  Stable bibasilar lung opacity, greater on the right. Admission abdominal CT favors pleural fluid and atelectasis.   Electronically Signed   By: Monte Fantasia M.D.   On: 05/14/2015 06:14   Dg Chest Port 1 View  05/12/2015   CLINICAL DATA:  Respiratory failure.  EXAM: PORTABLE CHEST - 1 VIEW  COMPARISON:  05/11/2015.  FINDINGS: NG tube noted coiled in stomach. Mediastinum hilar structures are normal. Heart size stable. Right lower lobe infiltrate consistent pneumonia. Right pleural effusion. Subsegmental atelectasis left lung base. No pneumothorax. Surgical clip left upper abdomen.  IMPRESSION:  1.  NG tube noted coiled in stomach.  2. Right lower lobe infiltrate consistent pneumonia. Moderate right pleural effusion.   Electronically Signed   By: Marcello Moores  Register   On: 05/12/2015 07:10   Dg Chest Port 1 View  05/11/2015   CLINICAL DATA:  Hypoxia  EXAM: PORTABLE CHEST - 1 VIEW  COMPARISON:  02/28/2013  FINDINGS: Normal heart size and aortic contours.  Atelectasis in the right upper lobe seen on the previous study has resolved. There is an indistinct opacity at the right base obscuring the diaphragm which is elevated compared to prior. No edema, effusion, or air leak.  IMPRESSION: Hazy opacity at the right base favoring atelectasis. This area should be seen on abdominal CT scheduled for later today.   Electronically Signed   By: Monte Fantasia M.D.   On: 05/11/2015 03:41   Dg Duanne Limerick W/water Sol Cm  05/16/2015   CLINICAL DATA:  Patient status post repair per stroke perforated gastric ulcer with omental patch.  EXAM: WATER SOLUBLE UPPER GI SERIES  TECHNIQUE: Single-column upper GI series was performed using water soluble contrast.  CONTRAST:  31mL OMNIPAQUE IOHEXOL 300 MG/ML  SOLN  COMPARISON:  None.  FLUOROSCOPY TIME:  Radiation Exposure Index (as provided by the fluoroscopic device):  If the device does not provide the exposure index:  Fluoroscopy Time (in minutes and seconds):  1 minutes 52 seconds  Number of  Acquired Images:  8  FINDINGS: Exam is suboptimal due to limited intake of the oral contrast as well as limited patient mobility. The contrast does flow easily into the stomach and ultimately passes into the first and second second portion of the duodenum. Stomach is gas-filled. No evidence of leak of the oral contrast.  IMPRESSION: No evidence of leak of oral contrast from the stomach on limited exam.   Electronically Signed   By: Suzy Bouchard M.D.   On: 05/16/2015 15:04   Dg Hip Unilat With Pelvis 2-3 Views Right  06/02/2015   CLINICAL DATA:  Pain.  No known trauma.  EXAM: DG HIP (WITH OR WITHOUT PELVIS) 2-3V RIGHT  COMPARISON:  None.  FINDINGS: Frontal pelvis as well as frontal and lateral right hip images obtained. There is moderate narrowing of both hip joints. No acute fracture or dislocation. No erosive change.  IMPRESSION: Narrowing both hip joints.  No fracture or dislocation.   Electronically Signed   By: Lowella Grip III M.D.   On: 06/02/2015 13:18    ECG  Normal sinus rhythm with significant T wave inversion in the anterior lead with some degree of ST depression in V5 through V6. Patient also has mild ST depression in the inferior lead as well. This is significantly changed from compared to her previous EKG on 05/11/2015  ASSESSMENT AND PLAN  1. Elevated trop with EKG changes concerning for ACS vs stress test vs intracranial etiology  - Patient has severe dementia, unable to provide any reasonable history. Family is not at bedside, attempted to call family twice has not been successful.  - Per nursing staff, patient told family about chest pain, however she is more focused on abdominal pain and leg pain during my interview.  - Agree the EKGs severely abnormal, we'll discuss with MD, given her significant dementia also advanced age of 81 years old, unlikely to be a good candidate for invasive therapy  - per ED physician, surgery cleared patient for IV heparin, however deep TWI in  anterior lead can also been seen with intracranial  etiology such as bleeding. Will need CT of head before starting IV heparin. If CT head negative, start IV heparin   - likely start ASA and lipitor +/- plavix depend on surgery service. Patient should be admitted to IM service at [facility] given significant comorbidities  - elevated trop likely portion poorer long term prognosis when combined with her advanced age, her code status should readdressed during this admission.  2. Postop anemia  3. Recent gastric ulcer s/p repair by gen surgery and omental patch on 8/18  4. Elevated WBC with normal venous lactic acid  5. HTN  6. HLD  7. Significant dementia   Signed, Almyra Deforest, PA-C 06/02/2015, 2:31 PM   I have seen and examined the patient along with Almyra Deforest, PA-C.  I have reviewed the chart, notes and new data.  I agree with PA's note.  Key new complaints: very hard to obtain any detailed history, but she did volunteer "my heart hurts" Key examination changes: no overt signs of CHF, no arrhythmia Key new findings / data: drastic ST-T segment changes are new since 2 weeks ago and probably represent LAD artery stenosis and ischemia. Troponin minimally elevated; CT head with marked atrophy, but no acute stroke or bleeding.  PLAN: Unstable angina, possibly small anterior wall NSTEMI in a very elderly woman with advanced dementia. Less likely, this is Takotsubo syndrome. The echo pattern of wall motion may help distinguish the two. She appears very stable right now, without HF, ventricular arrhythmia and without current angina. I do not think she is a good candidate for invasive evaluation or catheter based therapy and recommend conservative, medical management.  IV heparin (x 72 hours), aspirin, clopidogrel cautious beta blockers in view of age and very small body size. If there is LV dysfunction by echo, ACEi should be added as tolerated by BP. Her overall prognosis is not good and her  resuscitation status should be reviewed with the family. We could not reach her daughter, Elvin So, who has medical POA.  In my view, DNR status is medically appropriate.  Sanda Klein, MD, Rule [phone] 06/02/2015, 3:46 PM

## 2015-06-02 NOTE — ED Notes (Signed)
Pt can go to floor at 17:55 

## 2015-06-02 NOTE — ED Notes (Addendum)
Pt's granddaughter [facility] [phone])                                          [phone] (c)

## 2015-06-02 NOTE — Progress Notes (Signed)
ANTICOAGULATION CONSULT NOTE - Initial Consult  Pharmacy Consult for Heparin IV infusion Indication: chest pain/ACS  Allergies  Allergen Reactions  . Nsaids Other (See Comments)    Perforated gastric ulcer  . Amlodipine Besylate     REACTION: rash and itch  . Benzodiazepines Other (See Comments)    Hx of overuse/memory issue  . Penicillins   . Sulfonamide Derivatives     Patient Measurements:   Heparin Dosing Weight: 53.3 ( 05/12/15)  Vital Signs: Temp: 98.8 F (37.1 C) (09/09 1103) Temp Source: Oral (09/09 1103) BP: 148/63 mmHg (09/09 1421) Pulse Rate: 92 (09/09 1421)  Labs:  Recent Labs  06/02/15 1220  HGB 9.8*  HCT 31.3*  PLT 692*  CREATININE 0.77  TROPONINI 0.27*    CrCl cannot be calculated (Unknown ideal weight.).   Medical History: Past Medical History  Diagnosis Date  . HYPERLIPIDEMIA 04/16/2007  . ANEMIA-NOS 04/16/2007  . ANXIETY 04/16/2007  . DEPRESSION 09/03/2007  . HYPERTENSION 04/16/2007  . ATHEROSLERO NATV ART EXTREM W/INTERMIT CLAUDICAT 06/05/2010  . ALLERGIC RHINITIS 02/27/2009  . PNEUMONIA, COMMUNITY ACQUIRED, PNEUMOCOCCAL 12/07/2008  . ASTHMA 09/03/2007  . GERD 04/16/2007  . DIVERTICULOSIS, COLON 09/03/2007  . MENOPAUSAL DISORDER 04/18/2008  . OSTEOARTHRITIS, KNEE, LEFT 11/24/2009  . JOINT EFFUSION, LEFT KNEE 02/27/2009  . KNEE PAIN, BILATERAL 06/01/2010  . LOW BACK PAIN 04/16/2007  . LEG PAIN, LEFT 06/01/2010  . WEIGHT LOSS 07/25/2008  . RASH-NONVESICULAR 07/25/2008  . Abdominal pain, generalized 09/03/2007  . BREAST CANCER, HX OF 04/16/2007  . COLONIC POLYPS, HX OF 09/03/2007  . Dementia 06/17/2012    mild  . Peripheral neuropathy 06/17/2012    Medications:   (Not in a hospital admission)  Assessment: Pt is a 81 yo female diagnosed with ACS. Pt with elevated troponin. Pt had recent admission on 05/12/15 for perforated viscus. Pt did have post-op anemia for which she received 2 PRBC. No listed anticoagulants on PTA meds.    Goal of Therapy:   Heparin level 0.3-0.7 units/ml Monitor platelets by anticoagulation protocol: Yes   Plan:  Give 3000 units bolus x 1, and start heparin infusion at 600 units/hr.  Will monitor platelets, anti-Xa levels, signs of bleeding.  Will schedule anti-Xa level 8 hours after start due at 23:00.  Royetta Asal PharmD, BCPS 9/916 14:36

## 2015-06-03 ENCOUNTER — Inpatient Hospital Stay (HOSPITAL_COMMUNITY): Payer: Medicare Other

## 2015-06-03 DIAGNOSIS — R079 Chest pain, unspecified: Secondary | ICD-10-CM

## 2015-06-03 DIAGNOSIS — K255 Chronic or unspecified gastric ulcer with perforation: Secondary | ICD-10-CM

## 2015-06-03 DIAGNOSIS — G309 Alzheimer's disease, unspecified: Secondary | ICD-10-CM

## 2015-06-03 DIAGNOSIS — I10 Essential (primary) hypertension: Secondary | ICD-10-CM

## 2015-06-03 LAB — TROPONIN I
TROPONIN I: 0.24 ng/mL — AB (ref <0.031)
TROPONIN I: 0.24 ng/mL — AB (ref <0.031)

## 2015-06-03 LAB — HEPARIN LEVEL (UNFRACTIONATED)
HEPARIN UNFRACTIONATED: 0.11 [IU]/mL — AB (ref 0.30–0.70)
Heparin Unfractionated: 0.26 IU/mL — ABNORMAL LOW (ref 0.30–0.70)
Heparin Unfractionated: 0.33 IU/mL (ref 0.30–0.70)

## 2015-06-03 LAB — CBC
HEMATOCRIT: 29 % — AB (ref 36.0–46.0)
HEMOGLOBIN: 8.8 g/dL — AB (ref 12.0–15.0)
MCH: 30.7 pg (ref 26.0–34.0)
MCHC: 30.3 g/dL (ref 30.0–36.0)
MCV: 101 fL — AB (ref 78.0–100.0)
Platelets: 637 10*3/uL — ABNORMAL HIGH (ref 150–400)
RBC: 2.87 MIL/uL — AB (ref 3.87–5.11)
RDW: 16.1 % — AB (ref 11.5–15.5)
WBC: 14.3 10*3/uL — AB (ref 4.0–10.5)

## 2015-06-03 MED ORDER — ATORVASTATIN CALCIUM 10 MG PO TABS
10.0000 mg | ORAL_TABLET | Freq: Every day | ORAL | Status: DC
  Administered 2015-06-03 – 2015-06-07 (×5): 10 mg via ORAL
  Filled 2015-06-03 (×5): qty 1

## 2015-06-03 MED ORDER — HEPARIN (PORCINE) IN NACL 100-0.45 UNIT/ML-% IJ SOLN
700.0000 [IU]/h | INTRAMUSCULAR | Status: DC
  Administered 2015-06-03: 700 [IU]/h via INTRAVENOUS

## 2015-06-03 MED ORDER — METOPROLOL SUCCINATE ER 25 MG PO TB24
25.0000 mg | ORAL_TABLET | Freq: Every day | ORAL | Status: DC
  Administered 2015-06-03 – 2015-06-06 (×4): 25 mg via ORAL
  Filled 2015-06-03 (×4): qty 1

## 2015-06-03 MED ORDER — HEPARIN (PORCINE) IN NACL 100-0.45 UNIT/ML-% IJ SOLN
850.0000 [IU]/h | INTRAMUSCULAR | Status: DC
  Administered 2015-06-03: 850 [IU]/h via INTRAVENOUS
  Filled 2015-06-03: qty 250

## 2015-06-03 NOTE — Progress Notes (Signed)
Brief pharmacy note:  See note from Doreene Eland from earlier today for complete details  Tonight heparin level= 0.33 (therapeutic)  No signs or symptoms of bleeding  Plan: Continue heparin drip @ 850 Units/hr Check daily heparin level  Dolly Rias RPh 06/03/2015, 9:48 PM Pager [phone]

## 2015-06-03 NOTE — Progress Notes (Signed)
[facility]  Patient Status: Active (receiving services up to time of hospitalization)  [facility] is providing the following services: RN, PT, OT and MSW  If patient discharges after hours, please call [phone].   Elizabeth Peterson 06/03/2015, 5:43 PM

## 2015-06-03 NOTE — Progress Notes (Addendum)
ANTICOAGULATION CONSULT NOTE - Follow Up Consult  Pharmacy Consult for Heparin IV infusion Indication: chest pain/ACS  Allergies  Allergen Reactions  . Nsaids Other (See Comments)    Perforated gastric ulcer  . Amlodipine Besylate     REACTION: rash and itch  . Benzodiazepines Other (See Comments)    Hx of overuse/memory issue  . Penicillins   . Sulfonamide Derivatives     Patient Measurements: Height: 5\' 3"  (160 cm) Weight: 99 lb 10.4 oz (45.2 kg) IBW/kg (Calculated) : 52.4 Heparin Dosing Weight: total body weight  Vital Signs: Temp: 98.6 F (37 C) (09/10 0420) Temp Source: Oral (09/10 0420) BP: 144/60 mmHg (09/10 0420) Pulse Rate: 95 (09/10 0420)  Labs:  Recent Labs  06/02/15 1220 06/02/15 1507 06/02/15 1950 06/03/15 0056 06/03/15 0500 06/03/15 0716 06/03/15 1010  HGB 9.8*  --   --   --  8.8*  --   --   HCT 31.3*  --   --   --  29.0*  --   --   PLT 692*  --   --   --  637*  --   --   APTT  --  37  --   --   --   --   --   LABPROT  --  14.2  --   --   --   --   --   INR  --  1.08  --   --   --   --   --   HEPARINUNFRC  --   --   --  0.26*  --   --  0.11*  CREATININE 0.77  --   --   --   --   --   --   TROPONINI 0.27*  --  0.28* 0.24*  --  0.24*  --     Estimated Creatinine Clearance: 30.7 mL/min (by C-G formula based on Cr of 0.77).   Assessment: Pt is a 81 yo female diagnosed with ACS. Pt with elevated troponin. Pt had recent admission on 05/12/15 for perforated viscus. Pt did have post-op anemia for which she received 2 PRBC. No listed anticoagulants on PTA meds.   Today, 06/03/2015  Heparin level subtherapeutic, level actually decreased following rate increase this morning. No issues with infusion per RN and running at expected rate.  Possible bolus effect from initial level.    No complications of therapy not  CBC: Hgb trending down  Goal of Therapy:  Heparin level 0.3-0.7 units/ml, shoot for lower end d/t recent gastric ulcer Monitor platelets  by anticoagulation protocol: Yes   Plan:   Increase IV heparin to 850 units/hr  Per cardiology, plan is to stop heparin 9/11 am  Note recent gastric ulcer and omental patch repair 05/11/15 (on ASA and plavix)  Check heparin level 8hr after heparin rate increased  Follow daily heparin level & CBC  Doreene Eland, PharmD, BCPS.   Pager: [phone] 06/03/2015 12:16 PM

## 2015-06-03 NOTE — Progress Notes (Signed)
[facility] PROGRESS NOTE  Elizabeth Peterson KYH:[id number] DOB: 1919-04-20 DOA: 06/02/2015 PCP: Hollace Kinnier, DO  Brief narrative 81 year old female with severe Alzheimer's dementia with history of hypertension, hyperlipidemia, recent perforated gastric ulcer repaired with an omental patch on 05/11/2015, anxiety and depression, osteoarthritis who brought to the ED by family after patient complained of chest pain and right leg pain. Patient at baseline is wheelchair-bound and daughter denies any fall or trauma. As per family patient complaining of chest pain and abdominal pain 1 day prior to admission.  Course in the ED  Vitals were stable. Blood will done showed mild leukocytosis, hemoglobin of 9.8, platelets of 692. Chemistry showed sodium of 146, potassium 3.5, BUN of 22 0.77. Troponin was elevated 0.27. EKG showed normal sinus rhythm with ST depression in anterior leads and deep T-wave inversion in inferior and lateral leads concerning for NSTEMI. Head CT was unremarkable for acute abnormality. X-ray of the right hip was negative for any fracture. Chest x-ray was unremarkable.  Cardiology consulted who recommended medical management given her advanced age with severe dementia and poor functional status. Started on IV heparin after approved by surgery.   Assessment/Plan: Principal Problem:  NSTEMI (non-ST elevated myocardial infarction) Admitted to telemetry. Started on IV heparin drip after cleared by surgery (given recent perforated peptic ulcer with repair) Troponin of 0.24 for this morning. -Added low dose beta blocker and statin. Check 2-D echo. -Appreciate cardiology follow-up. Sublingual nitroglycerin when necessary for chest pain symptoms.   Active Problems:  Essential hypertension Continue home dose hydralazine. Added low dose beta blocker     GERD with ulcerated gastric ulcer status post open omental patch on 05/11/2015 Currently stable. on PPI twice a day. Monitor  H&H   Severe protein-calorie malnutrition   Alzheimer's disease with severe dementia Continue Namenda  Leukocytosis No clinical sign of infection.  Check UA. Repeat CBC this morning.     Diet:cardiac  DVT prophylaxis: IV heparin   Code Status: DO NOT RESUSCITATE (after discussing in detail with patient's daughter Elizabeth Peterson on the phone) Family Communication: Discussed with daughter on the phone on admission Disposition Plan: Continue telemetry monitoring. Home once improved   HPI/Subjective: Patient seen and examined. Complains of left leg pain. No overnight issues.  Objective: Filed Vitals:   06/03/15 0420  BP: 144/60  Pulse: 95  Temp: 98.6 F (37 C)  Resp: 24    Intake/Output Summary (Last 24 hours) at 06/03/15 1106 Last data filed at 06/03/15 0919  Gross per 24 hour  Intake      0 ml  Output      0 ml  Net      0 ml   Filed Weights   06/02/15 2000  Weight: 45.2 kg (99 lb 10.4 oz)    Exam:   General:  Elderly demented female not in distress  HEENT: No pallor, temporal wasting, moist mucosa  Cardiovascular: Normal S1 and S2, no murmurs rub or gallop  Respiratory: Clear bilaterally  Abdomen: Soft, nondistended, nontender, bowel sounds present  Musculoskeletal: Warm, no edema, nontender  CNS: Demented   Data Reviewed: Basic Metabolic Panel:  Recent Labs Lab 06/02/15 1220  NA 146*  K 3.5  CL 111  CO2 23  GLUCOSE 101*  BUN 22*  CREATININE 0.77  CALCIUM 8.3*   Liver Function Tests:  Recent Labs Lab 06/02/15 1220  AST 31  ALT 14  ALKPHOS 71  BILITOT 0.8  PROT 7.6  ALBUMIN 2.4*   No results for input(s): LIPASE, AMYLASE  in the last 168 hours. No results for input(s): AMMONIA in the last 168 hours. CBC:  Recent Labs Lab 06/02/15 1220  WBC 13.6*  NEUTROABS 11.1*  HGB 9.8*  HCT 31.3*  MCV 96.0  PLT 692*   Cardiac Enzymes:  Recent Labs Lab 06/02/15 1220 06/02/15 1950 06/03/15 0056 06/03/15 0716  TROPONINI 0.27*  0.28* 0.24* 0.24*   BNP (last 3 results)  Recent Labs  05/16/15 0705  BNP 3210.9*    ProBNP (last 3 results) No results for input(s): PROBNP in the last 8760 hours.  CBG: No results for input(s): GLUCAP in the last 168 hours.  No results found for this or any previous visit (from the past 240 hour(s)).   Studies: Dg Chest 2 View  06/02/2015   CLINICAL DATA:  Altered mental status. History of breast carcinoma. Hypertension.  EXAM: CHEST  2 VIEW  COMPARISON:  May 19, 2015  FINDINGS: There is no edema or consolidation. Heart size and pulmonary vascularity are normal. No adenopathy. There is atherosclerotic calcification in the aorta. There is a surgical clip in the left hemi thorax region. No bone lesions are appreciable. No pneumothorax.  IMPRESSION: No edema or consolidation.   Electronically Signed   By: Lowella Grip III M.D.   On: 06/02/2015 13:19   Ct Head Wo Contrast  06/02/2015   CLINICAL DATA:  Confusion.  History of dementia  EXAM: CT HEAD WITHOUT CONTRAST  TECHNIQUE: Contiguous axial images were obtained from the base of the skull through the vertex without intravenous contrast.  COMPARISON:  October 01, 2011  FINDINGS: There is persistent frontal and temporal lobe atrophy bilaterally. The parietal and occipital lobes do not show appreciable atrophy. Ventricles are enlarged in a generalized manner, stable. There is no intracranial mass, hemorrhage, extra-axial fluid collection, or midline shift. There is patchy small vessel disease in the centra semiovale, likely due to small vessel vascular disease as well as a potential degree of interstitial edema given the degree of ventricular enlargement present. No new gray-white compartment lesions. No acute infarct evident. Bony calvarium appears intact. The mastoid air cells are clear.  IMPRESSION: Stable appearance compared to the 2013 examination. Evidence of frontal and temporal lobar degeneration. Given the overall appearance of the  ventricles, there may be a degree of underlying communicating hydrocephalus. There is probable periventricular small vessel disease, likely with superimposed interstitial edema due to the enlarged ventricles. No acute infarct evident. No hemorrhage or mass effect.   Electronically Signed   By: Lowella Grip III M.D.   On: 06/02/2015 15:27   Dg Hip Unilat With Pelvis 2-3 Views Right  06/02/2015   CLINICAL DATA:  Pain.  No known trauma.  EXAM: DG HIP (WITH OR WITHOUT PELVIS) 2-3V RIGHT  COMPARISON:  None.  FINDINGS: Frontal pelvis as well as frontal and lateral right hip images obtained. There is moderate narrowing of both hip joints. No acute fracture or dislocation. No erosive change.  IMPRESSION: Narrowing both hip joints.  No fracture or dislocation.   Electronically Signed   By: Lowella Grip III M.D.   On: 06/02/2015 13:18    Scheduled Meds: . acetaminophen  500 mg Oral 3 times per day  . aspirin  324 mg Oral NOW   Or  . aspirin  300 mg Rectal NOW  . aspirin EC  81 mg Oral Daily  . atorvastatin  10 mg Oral q1800  . clopidogrel  75 mg Oral Q breakfast  . feeding supplement (ENSURE ENLIVE)  237 mL Oral BID BM  . gabapentin  100 mg Oral TID  . hydrALAZINE  50 mg Oral 3 times per day  . memantine  28 mg Oral Daily  . metoprolol succinate  25 mg Oral Daily  . pantoprazole  40 mg Oral BID AC   Continuous Infusions: . sodium chloride 75 mL/hr at 06/03/15 0025  . heparin 700 Units/hr (06/03/15 0159)       Time spent: 25 minutes    Jaylon Grode, [place]  [facility] Pager [phone]. If 7PM-7AM, please contact night-coverage at [email or web address], password [facility] 06/03/2015, 11:06 AM  LOS: 1 day

## 2015-06-03 NOTE — Progress Notes (Signed)
Subjective:  No complete a chest pain this morning. When asked if she is hurting, she does state her legs and her stomach in general. She is in no distress, breathing comfortably.  Objective:  Vital Signs in the last 24 hours: Temp:  [97.7 F (36.5 C)-98.8 F (37.1 C)] 98.6 F (37 C) (09/10 0420) Pulse Rate:  [85-97] 95 (09/10 0420) Resp:  [18-32] 24 (09/10 0420) BP: (109-161)/(53-129) 144/60 mmHg (09/10 0420) SpO2:  [94 %-99 %] 97 % (09/10 0420) Weight:  [99 lb 10.4 oz (45.2 kg)] 99 lb 10.4 oz (45.2 kg) (09/09 2000)  Intake/Output from previous day:     Physical Exam: General: Elderly in bed, in no acute distress. Head:  Normocephalic and atraumatic. Lungs: Clear to auscultation and percussion. Heart: Normal S1 and S2.  No murmur, rubs or gallops.  Abdomen: soft, non-tender, positive bowel sounds. Midline incision noted, well-healed Extremities: No clubbing or cyanosis. No edema. Neurologic: Alert and oriented x 3.    Lab Results:  Recent Labs  06/02/15 1220  WBC 13.6*  HGB 9.8*  PLT 692*    Recent Labs  06/02/15 1220  NA 146*  K 3.5  CL 111  CO2 23  GLUCOSE 101*  BUN 22*  CREATININE 0.77    Recent Labs  06/02/15 1950 06/03/15 0056  TROPONINI 0.28* 0.24*   Hepatic Function Panel  Recent Labs  06/02/15 1220  PROT 7.6  ALBUMIN 2.4*  AST 31  ALT 14  ALKPHOS 71  BILITOT 0.8   No results for input(s): CHOL in the last 72 hours. No results for input(s): PROTIME in the last 72 hours.  Imaging: Dg Chest 2 View  06/02/2015   CLINICAL DATA:  Altered mental status. History of breast carcinoma. Hypertension.  EXAM: CHEST  2 VIEW  COMPARISON:  May 19, 2015  FINDINGS: There is no edema or consolidation. Heart size and pulmonary vascularity are normal. No adenopathy. There is atherosclerotic calcification in the aorta. There is a surgical clip in the left hemi thorax region. No bone lesions are appreciable. No pneumothorax.  IMPRESSION: No edema or  consolidation.   Electronically Signed   By: Lowella Grip III M.D.   On: 06/02/2015 13:19   Ct Head Wo Contrast  06/02/2015   CLINICAL DATA:  Confusion.  History of dementia  EXAM: CT HEAD WITHOUT CONTRAST  TECHNIQUE: Contiguous axial images were obtained from the base of the skull through the vertex without intravenous contrast.  COMPARISON:  October 01, 2011  FINDINGS: There is persistent frontal and temporal lobe atrophy bilaterally. The parietal and occipital lobes do not show appreciable atrophy. Ventricles are enlarged in a generalized manner, stable. There is no intracranial mass, hemorrhage, extra-axial fluid collection, or midline shift. There is patchy small vessel disease in the centra semiovale, likely due to small vessel vascular disease as well as a potential degree of interstitial edema given the degree of ventricular enlargement present. No new gray-white compartment lesions. No acute infarct evident. Bony calvarium appears intact. The mastoid air cells are clear.  IMPRESSION: Stable appearance compared to the 2013 examination. Evidence of frontal and temporal lobar degeneration. Given the overall appearance of the ventricles, there may be a degree of underlying communicating hydrocephalus. There is probable periventricular small vessel disease, likely with superimposed interstitial edema due to the enlarged ventricles. No acute infarct evident. No hemorrhage or mass effect.   Electronically Signed   By: Lowella Grip III M.D.   On: 06/02/2015 15:27  Dg Hip Unilat With Pelvis 2-3 Views Right  06/02/2015   CLINICAL DATA:  Pain.  No known trauma.  EXAM: DG HIP (WITH OR WITHOUT PELVIS) 2-3V RIGHT  COMPARISON:  None.  FINDINGS: Frontal pelvis as well as frontal and lateral right hip images obtained. There is moderate narrowing of both hip joints. No acute fracture or dislocation. No erosive change.  IMPRESSION: Narrowing both hip joints.  No fracture or dislocation.   Electronically Signed    By: Lowella Grip III M.D.   On: 06/02/2015 13:18   Personally viewed.   Telemetry: Sinus tachycardia - no VT, no AFIB Personally viewed.   EKG:  Normal sinus rhythm with significant T wave inversion in the anterior lead with some degree of ST depression in V5 through V6. Patient also has mild ST depression in the inferior lead as well. This is significantly changed from compared to her previous EKG on 05/11/2015  Cardiac Studies:  No new studies  Scheduled Meds: . acetaminophen  500 mg Oral 3 times per day  . aspirin  324 mg Oral NOW   Or  . aspirin  300 mg Rectal NOW  . aspirin EC  81 mg Oral Daily  . clopidogrel  75 mg Oral Q breakfast  . feeding supplement (ENSURE ENLIVE)  237 mL Oral BID BM  . gabapentin  100 mg Oral TID  . hydrALAZINE  50 mg Oral 3 times per day  . memantine  28 mg Oral Daily  . pantoprazole  40 mg Oral BID AC   Continuous Infusions: . sodium chloride 75 mL/hr at 06/03/15 0025  . heparin 700 Units/hr (06/03/15 0159)   PRN Meds:.acetaminophen, nitroGLYCERIN, ondansetron (ZOFRAN) IV, traMADol  Assessment/Plan:  Principal Problem:   NSTEMI (non-ST elevated myocardial infarction) Active Problems:   Essential hypertension   GERD   Severe protein-calorie malnutrition   Alzheimer's disease   Gastric ulcer with perforation s/p open omental patch 05/11/2015  81 year old female with non-ST elevation myocardial infarction, very mild troponin elevation, T-wave inversions noted on EKG possible anterior wall distribution with underlying Alzheimer's.  1. Non-ST elevation myocardial infarction  - Noninvasive strategy, continue with medical management  - Continue IV heparin today, discontinue tomorrow morning (Sunday)  - Continue aspirin, Plavix. I will add low-dose beta blocker given her sinus tachycardia on telemetry transiently. Toprol-XL 25 mg once a day. I will also add statin. I will use low-dose atorvastatin secondary to advanced age.  - Echocardiogram  pending  2. Agree with DO NOT RESUSCITATE given her increased morbidity/mortality/advanced age.  3. Essential hypertension-currently well controlled  4. Alzheimer's dementia-per primary team.  5. Recent gastric ulcer status post repair by general surgery with omental patch in August 18. Watch for any signs of bleeding especially with comminution of aspirin and Plavix.    SKAINS, [place] 06/03/2015, 7:32 AM

## 2015-06-03 NOTE — Progress Notes (Signed)
  Echocardiogram 2D Echocardiogram has been performed.  Lysle Rubens 06/03/2015, 11:40 AM

## 2015-06-03 NOTE — Progress Notes (Signed)
While bathing pt noticed a lump on right hip, soft to touch and moveable.  Appears to be a blackhead on this lump.  Also, placed a small allevyn to right lower buttocks (over stage 2), and small allevyn to pts right heel.  Pillow placed between knees and positioned on her left side with pillows supporting pts back.

## 2015-06-03 NOTE — Progress Notes (Signed)
ANTICOAGULATION CONSULT NOTE - Follow Up Consult  Pharmacy Consult for Heparin IV infusion Indication: chest pain/ACS  Allergies  Allergen Reactions  . Nsaids Other (See Comments)    Perforated gastric ulcer  . Amlodipine Besylate     REACTION: rash and itch  . Benzodiazepines Other (See Comments)    Hx of overuse/memory issue  . Penicillins   . Sulfonamide Derivatives     Patient Measurements: Height: 5\' 3"  (160 cm) Weight: 99 lb 10.4 oz (45.2 kg) IBW/kg (Calculated) : 52.4 Heparin Dosing Weight: total body weight  Vital Signs: Temp: 97.7 F (36.5 C) (09/09 2144) Temp Source: Oral (09/09 2144) BP: 157/58 mmHg (09/09 2144) Pulse Rate: 85 (09/09 2144)  Labs:  Recent Labs  06/02/15 1220 06/02/15 1507 06/02/15 1950 06/03/15 0056  HGB 9.8*  --   --   --   HCT 31.3*  --   --   --   PLT 692*  --   --   --   APTT  --  37  --   --   LABPROT  --  14.2  --   --   INR  --  1.08  --   --   HEPARINUNFRC  --   --   --  0.26*  CREATININE 0.77  --   --   --   TROPONINI 0.27*  --  0.28*  --     Estimated Creatinine Clearance: 30.7 mL/min (by C-G formula based on Cr of 0.77).   Medical History: Past Medical History  Diagnosis Date  . HYPERLIPIDEMIA 04/16/2007  . ANEMIA-NOS 04/16/2007  . ANXIETY 04/16/2007  . DEPRESSION 09/03/2007  . HYPERTENSION 04/16/2007  . ATHEROSLERO NATV ART EXTREM W/INTERMIT CLAUDICAT 06/05/2010  . ALLERGIC RHINITIS 02/27/2009  . PNEUMONIA, COMMUNITY ACQUIRED, PNEUMOCOCCAL 12/07/2008  . ASTHMA 09/03/2007  . GERD 04/16/2007  . DIVERTICULOSIS, COLON 09/03/2007  . MENOPAUSAL DISORDER 04/18/2008  . OSTEOARTHRITIS, KNEE, LEFT 11/24/2009  . JOINT EFFUSION, LEFT KNEE 02/27/2009  . KNEE PAIN, BILATERAL 06/01/2010  . LOW BACK PAIN 04/16/2007  . LEG PAIN, LEFT 06/01/2010  . WEIGHT LOSS 07/25/2008  . RASH-NONVESICULAR 07/25/2008  . Abdominal pain, generalized 09/03/2007  . BREAST CANCER, HX OF 04/16/2007  . COLONIC POLYPS, HX OF 09/03/2007  . Dementia 06/17/2012   mild  . Peripheral neuropathy 06/17/2012    Medications:  Prescriptions prior to admission  Medication Sig Dispense Refill Last Dose  . acetaminophen (TYLENOL) 650 MG CR tablet Take 650 mg by mouth daily as needed for pain.    06/02/2015 at Unknown time  . AMBULATORY NON FORMULARY MEDICATION Wheelchair and Cushion 1 each 0 Taking  . cetirizine (ZYRTEC) 10 MG tablet Take 1 tablet (10 mg total) by mouth daily. (Patient taking differently: Take 10 mg by mouth daily as needed for allergies. ) 30 tablet 3 PRN  . gabapentin (NEURONTIN) 100 MG capsule Take 1 capsule (100 mg total) by mouth 3 (three) times daily. 90 capsule 5 06/01/2015 at Unknown time  . hydrALAZINE (APRESOLINE) 50 MG tablet Take one tablet by mouth four times daily for blood pressure (Patient taking differently: Take 50 mg by mouth 4 (four) times daily. ) 120 tablet 5 06/01/2015 at Unknown time  . Memantine HCl ER 28 MG CP24 Take 28 mg by mouth daily. 90 capsule 3 05/31/2015  . pantoprazole (PROTONIX) 40 MG tablet Take 1 tablet (40 mg total) by mouth 2 (two) times daily before a meal. (Patient taking differently: Take 40 mg by mouth 2 (two) times  daily as needed (acid reflux). ) 60 tablet 1 PRN at Unknown time  . traMADol (ULTRAM) 50 MG tablet Take 1 tablet (50 mg total) by mouth every 12 (twelve) hours as needed for moderate pain or severe pain. 50 tablet 1 06/01/2015 at Unknown time  . levofloxacin (LEVAQUIN) 750 MG tablet Take 1 tablet (750 mg total) by mouth every other day. X 6 doses (Patient not taking: Reported on 06/02/2015) 6 tablet 0 Not Taking at Unknown time  . Tdap (BOOSTRIX) 5-2.5-18.5 LF-MCG/0.5 injection Inject 0.5 mLs into the muscle once. (Patient not taking: Reported on 06/02/2015) 0.5 mL 0 Not Taking at Unknown time    Assessment: Pt is a 81 yo female diagnosed with ACS. Pt with elevated troponin. Pt had recent admission on 05/12/15 for perforated viscus. Pt did have post-op anemia for which she received 2 PRBC. No listed  anticoagulants on PTA meds.   1st heparin level = 0.26 on heparin @ 600 units/hr  No complications of therapy noted  Goal of Therapy:  Heparin level 0.3-0.7 units/ml Monitor platelets by anticoagulation protocol: Yes   Plan:   Increase IV heparin to 700 units/hr  Check heparin level 9 hr after heparin rate increased  Follow daily heparin level & CBC  Leone Haven, PharmD 06/03/15 @ 01:42

## 2015-06-04 LAB — CBC
HCT: 25.6 % — ABNORMAL LOW (ref 36.0–46.0)
Hemoglobin: 7.9 g/dL — ABNORMAL LOW (ref 12.0–15.0)
MCH: 30.4 pg (ref 26.0–34.0)
MCHC: 30.9 g/dL (ref 30.0–36.0)
MCV: 98.5 fL (ref 78.0–100.0)
PLATELETS: 587 10*3/uL — AB (ref 150–400)
RBC: 2.6 MIL/uL — ABNORMAL LOW (ref 3.87–5.11)
RDW: 16.4 % — AB (ref 11.5–15.5)
WBC: 13.3 10*3/uL — AB (ref 4.0–10.5)

## 2015-06-04 LAB — HEPARIN LEVEL (UNFRACTIONATED): HEPARIN UNFRACTIONATED: 0.31 [IU]/mL (ref 0.30–0.70)

## 2015-06-04 LAB — URINE MICROSCOPIC-ADD ON

## 2015-06-04 LAB — URINALYSIS, ROUTINE W REFLEX MICROSCOPIC
GLUCOSE, UA: NEGATIVE mg/dL
Hgb urine dipstick: NEGATIVE
KETONES UR: NEGATIVE mg/dL
LEUKOCYTES UA: NEGATIVE
NITRITE: NEGATIVE
PROTEIN: 30 mg/dL — AB
Specific Gravity, Urine: 1.026 (ref 1.005–1.030)
UROBILINOGEN UA: 0.2 mg/dL (ref 0.0–1.0)
pH: 5.5 (ref 5.0–8.0)

## 2015-06-04 MED ORDER — SODIUM CHLORIDE 0.9 % IV BOLUS (SEPSIS)
500.0000 mL | Freq: Once | INTRAVENOUS | Status: AC
  Administered 2015-06-04: 500 mL via INTRAVENOUS

## 2015-06-04 NOTE — Progress Notes (Signed)
06/04/15 1834  Notified MD of patients low urine output

## 2015-06-04 NOTE — Progress Notes (Signed)
Utilization Review Completed.Imran Nuon T9/07/2015  

## 2015-06-04 NOTE — Progress Notes (Signed)
[facility] PROGRESS NOTE  Elizabeth Peterson GMW:[id number] DOB: 11-Jul-1919 DOA: 06/02/2015 PCP: Hollace Kinnier, DO  Brief narrative 81 year old female with severe Alzheimer's dementia with history of hypertension, hyperlipidemia, recent perforated gastric ulcer repaired with an omental patch on 05/11/2015, anxiety and depression, osteoarthritis who brought to the ED by family after patient complained of chest pain and right leg pain. Patient at baseline is wheelchair-bound and daughter denies any fall or trauma. As per family patient complaining of chest pain and abdominal pain 1 day prior to admission.  Course in the ED  Vitals  stable. Blood work done showed mild leukocytosis, hemoglobin of 9.8, platelets of 692. Chemistry showed sodium of 146, potassium 3.5, BUN of 22 0.77. Troponin was elevated 0.27. EKG showed normal sinus rhythm with ST depression in anterior leads and deep T-wave inversion in inferior and lateral leads concerning for NSTEMI. Head CT was unremarkable for acute abnormality. X-ray of the right hip was negative for any fracture. Chest x-ray was unremarkable.  Cardiology consulted who recommended medical management given her advanced age with severe dementia and poor functional status. Started on IV heparin after approved by surgery.   Assessment/Plan: Principal Problem:  NSTEMI (non-ST elevated myocardial infarction)  Started on IV heparin drip after cleared by surgery (given recent perforated peptic ulcer with repair). Heparin drip discontinued this morning. Plan on medical management.  Troponin peaked at 0.28.. -Added low dose beta blocker and statin. 2-D echo with EF of 55-60% with grade 1 diastolic dysfunction. Continue aspirin, Plavix.   Active Problems:  Essential hypertension Continue home dose hydralazine. Added low dose beta blocker     GERD with ulcerated gastric ulcer status post open omental patch on 05/11/2015  stable. on PPI twice a day. Monitor  H&H   Severe protein-calorie malnutrition Added supplements.   Alzheimer's disease with severe dementia Continue Namenda  Leukocytosis No clinical sign of infection.  Check UA. Repeat CBC this morning.  Right hip pain X-ray on admission unremarkable. Continue scheduled Tylenol alternating with when necessary tramadol.    Diet:cardiac  DVT prophylaxis: sq lovenox  Consult Cardiology   Code Status: DO NOT RESUSCITATE (after discussing in detail with patient's daughter Elizabeth Peterson on the phone) Family Communication: Discussed with daughter on the phone on admission Disposition Plan: Continue telemetry monitoring. Home in 1-2 days.   HPI/Subjective: Patient seen and examined. Complains of rt leg pain. No overnight issues.  Objective: Filed Vitals:   06/04/15 1021  BP: 134/85  Pulse: 72  Temp:   Resp: 16    Intake/Output Summary (Last 24 hours) at 06/04/15 1145 Last data filed at 06/04/15 1100  Gross per 24 hour  Intake    100 ml  Output      0 ml  Net    100 ml   Filed Weights   06/02/15 2000  Weight: 45.2 kg (99 lb 10.4 oz)    Exam:   General:  Elderly demented female not in distress  HEENT: No pallor, temporal wasting, moist mucosa  Cardiovascular: Normal S1 and S2, no murmurs rub or gallop  Respiratory: Clear bilaterally  Abdomen: Soft, nondistended, nontender, bowel sounds present  Musculoskeletal: Warm, no edema, nontender  CNS: Demented   Data Reviewed: Basic Metabolic Panel:  Recent Labs Lab 06/02/15 1220  NA 146*  K 3.5  CL 111  CO2 23  GLUCOSE 101*  BUN 22*  CREATININE 0.77  CALCIUM 8.3*   Liver Function Tests:  Recent Labs Lab 06/02/15 1220  AST 31  ALT 14  ALKPHOS 71  BILITOT 0.8  PROT 7.6  ALBUMIN 2.4*   No results for input(s): LIPASE, AMYLASE in the last 168 hours. No results for input(s): AMMONIA in the last 168 hours. CBC:  Recent Labs Lab 06/02/15 1220 06/03/15 0500 06/04/15 0535  WBC 13.6* 14.3*  13.3*  NEUTROABS 11.1*  --   --   HGB 9.8* 8.8* 7.9*  HCT 31.3* 29.0* 25.6*  MCV 96.0 101.0* 98.5  PLT 692* 637* 587*   Cardiac Enzymes:  Recent Labs Lab 06/02/15 1220 06/02/15 1950 06/03/15 0056 06/03/15 0716  TROPONINI 0.27* 0.28* 0.24* 0.24*   BNP (last 3 results)  Recent Labs  05/16/15 0705  BNP 3210.9*    ProBNP (last 3 results) No results for input(s): PROBNP in the last 8760 hours.  CBG: No results for input(s): GLUCAP in the last 168 hours.  No results found for this or any previous visit (from the past 240 hour(s)).   Studies: Dg Chest 2 View  06/02/2015   CLINICAL DATA:  Altered mental status. History of breast carcinoma. Hypertension.  EXAM: CHEST  2 VIEW  COMPARISON:  May 19, 2015  FINDINGS: There is no edema or consolidation. Heart size and pulmonary vascularity are normal. No adenopathy. There is atherosclerotic calcification in the aorta. There is a surgical clip in the left hemi thorax region. No bone lesions are appreciable. No pneumothorax.  IMPRESSION: No edema or consolidation.   Electronically Signed   By: Lowella Grip III M.D.   On: 06/02/2015 13:19   Ct Head Wo Contrast  06/02/2015   CLINICAL DATA:  Confusion.  History of dementia  EXAM: CT HEAD WITHOUT CONTRAST  TECHNIQUE: Contiguous axial images were obtained from the base of the skull through the vertex without intravenous contrast.  COMPARISON:  October 01, 2011  FINDINGS: There is persistent frontal and temporal lobe atrophy bilaterally. The parietal and occipital lobes do not show appreciable atrophy. Ventricles are enlarged in a generalized manner, stable. There is no intracranial mass, hemorrhage, extra-axial fluid collection, or midline shift. There is patchy small vessel disease in the centra semiovale, likely due to small vessel vascular disease as well as a potential degree of interstitial edema given the degree of ventricular enlargement present. No new gray-white compartment lesions. No  acute infarct evident. Bony calvarium appears intact. The mastoid air cells are clear.  IMPRESSION: Stable appearance compared to the 2013 examination. Evidence of frontal and temporal lobar degeneration. Given the overall appearance of the ventricles, there may be a degree of underlying communicating hydrocephalus. There is probable periventricular small vessel disease, likely with superimposed interstitial edema due to the enlarged ventricles. No acute infarct evident. No hemorrhage or mass effect.   Electronically Signed   By: Lowella Grip III M.D.   On: 06/02/2015 15:27   Dg Hip Unilat With Pelvis 2-3 Views Right  06/02/2015   CLINICAL DATA:  Pain.  No known trauma.  EXAM: DG HIP (WITH OR WITHOUT PELVIS) 2-3V RIGHT  COMPARISON:  None.  FINDINGS: Frontal pelvis as well as frontal and lateral right hip images obtained. There is moderate narrowing of both hip joints. No acute fracture or dislocation. No erosive change.  IMPRESSION: Narrowing both hip joints.  No fracture or dislocation.   Electronically Signed   By: Lowella Grip III M.D.   On: 06/02/2015 13:18    Scheduled Meds: . acetaminophen  500 mg Oral 3 times per day  . aspirin EC  81 mg Oral Daily  . atorvastatin  10 mg Oral q1800  . clopidogrel  75 mg Oral Q breakfast  . feeding supplement (ENSURE ENLIVE)  237 mL Oral BID BM  . gabapentin  100 mg Oral TID  . hydrALAZINE  50 mg Oral 3 times per day  . memantine  28 mg Oral Daily  . metoprolol succinate  25 mg Oral Daily  . pantoprazole  40 mg Oral BID AC   Continuous Infusions:       Time spent: 25 minutes    Elizabeth Peterson, [place]  [facility] Pager [phone]. If 7PM-7AM, please contact night-coverage at [email or web address], password [facility] 06/04/2015, 11:45 AM  LOS: 2 days

## 2015-06-04 NOTE — Progress Notes (Signed)
Subjective:  Patient comfortably sleeping. No complaints.   Objective:  Vital Signs in the last 24 hours: Temp:  [97.7 F (36.5 C)-98.2 F (36.8 C)] 98.2 F (36.8 C) (09/11 0624) Pulse Rate:  [77-92] 90 (09/11 0624) Resp:  [18-20] 18 (09/11 0624) BP: (126-147)/(39-59) 134/46 mmHg (09/11 0624) SpO2:  [97 %-99 %] 97 % (09/11 0624)  Intake/Output from previous day:     Physical Exam: General: Elderly in bed, in no acute distress. Head:  Normocephalic and atraumatic. Lungs: Clear to auscultation and percussion. Heart: Normal S1 and S2.  No murmur, rubs or gallops.  Abdomen: soft, non-tender, positive bowel sounds. Midline incision noted, well-healed Extremities: No clubbing or cyanosis. No edema. Neurologic: Alert and oriented x 3.    Lab Results:  Recent Labs  06/03/15 0500 06/04/15 0535  WBC 14.3* 13.3*  HGB 8.8* 7.9*  PLT 637* 587*    Recent Labs  06/02/15 1220  NA 146*  K 3.5  CL 111  CO2 23  GLUCOSE 101*  BUN 22*  CREATININE 0.77    Recent Labs  06/03/15 0056 06/03/15 0716  TROPONINI 0.24* 0.24*   Hepatic Function Panel  Recent Labs  06/02/15 1220  PROT 7.6  ALBUMIN 2.4*  AST 31  ALT 14  ALKPHOS 71  BILITOT 0.8   No results for input(s): CHOL in the last 72 hours. No results for input(s): PROTIME in the last 72 hours.  Imaging: Dg Chest 2 View  06/02/2015   CLINICAL DATA:  Altered mental status. History of breast carcinoma. Hypertension.  EXAM: CHEST  2 VIEW  COMPARISON:  May 19, 2015  FINDINGS: There is no edema or consolidation. Heart size and pulmonary vascularity are normal. No adenopathy. There is atherosclerotic calcification in the aorta. There is a surgical clip in the left hemi thorax region. No bone lesions are appreciable. No pneumothorax.  IMPRESSION: No edema or consolidation.   Electronically Signed   By: Lowella Grip III M.D.   On: 06/02/2015 13:19   Ct Head Wo Contrast  06/02/2015   CLINICAL DATA:  Confusion.   History of dementia  EXAM: CT HEAD WITHOUT CONTRAST  TECHNIQUE: Contiguous axial images were obtained from the base of the skull through the vertex without intravenous contrast.  COMPARISON:  October 01, 2011  FINDINGS: There is persistent frontal and temporal lobe atrophy bilaterally. The parietal and occipital lobes do not show appreciable atrophy. Ventricles are enlarged in a generalized manner, stable. There is no intracranial mass, hemorrhage, extra-axial fluid collection, or midline shift. There is patchy small vessel disease in the centra semiovale, likely due to small vessel vascular disease as well as a potential degree of interstitial edema given the degree of ventricular enlargement present. No new gray-white compartment lesions. No acute infarct evident. Bony calvarium appears intact. The mastoid air cells are clear.  IMPRESSION: Stable appearance compared to the 2013 examination. Evidence of frontal and temporal lobar degeneration. Given the overall appearance of the ventricles, there may be a degree of underlying communicating hydrocephalus. There is probable periventricular small vessel disease, likely with superimposed interstitial edema due to the enlarged ventricles. No acute infarct evident. No hemorrhage or mass effect.   Electronically Signed   By: Lowella Grip III M.D.   On: 06/02/2015 15:27   Dg Hip Unilat With Pelvis 2-3 Views Right  06/02/2015   CLINICAL DATA:  Pain.  No known trauma.  EXAM: DG HIP (WITH OR WITHOUT PELVIS) 2-3V RIGHT  COMPARISON:  None.  FINDINGS: Frontal pelvis as well as frontal and lateral right hip images obtained. There is moderate narrowing of both hip joints. No acute fracture or dislocation. No erosive change.  IMPRESSION: Narrowing both hip joints.  No fracture or dislocation.   Electronically Signed   By: Lowella Grip III M.D.   On: 06/02/2015 13:18   Personally viewed.   Telemetry: Sinus tachycardia - no VT, no AFIB. Brief PAT.  Personally viewed.     EKG:  Normal sinus rhythm with significant T wave inversion in the anterior lead with some degree of ST depression in V5 through V6. Patient also has mild ST depression in the inferior lead as well. This is significantly changed from compared to her previous EKG on 05/11/2015  Cardiac Studies:  No new studies  Scheduled Meds: . acetaminophen  500 mg Oral 3 times per day  . aspirin EC  81 mg Oral Daily  . atorvastatin  10 mg Oral q1800  . clopidogrel  75 mg Oral Q breakfast  . feeding supplement (ENSURE ENLIVE)  237 mL Oral BID BM  . gabapentin  100 mg Oral TID  . hydrALAZINE  50 mg Oral 3 times per day  . memantine  28 mg Oral Daily  . metoprolol succinate  25 mg Oral Daily  . pantoprazole  40 mg Oral BID AC   Continuous Infusions: . heparin 850 Units/hr (06/03/15 2337)   PRN Meds:.acetaminophen, nitroGLYCERIN, ondansetron (ZOFRAN) IV, traMADol  Assessment/Plan:  Principal Problem:   NSTEMI (non-ST elevated myocardial infarction) Active Problems:   Essential hypertension   GERD   Severe protein-calorie malnutrition   Alzheimer's disease   Gastric ulcer with perforation s/p open omental patch 05/11/2015  81 year old female with non-ST elevation myocardial infarction, very mild troponin elevation, T-wave inversions noted on EKG possible anterior wall distribution with underlying Alzheimer's.  1. Non-ST elevation myocardial infarction  - ECHO reassuring - normal EF  - Noninvasive strategy, continue with medical management  - DC IV heparin today  - Continue aspirin, Plavix, low-dose beta blocker given her sinus tachycardia on telemetry transiently. Toprol-XL 25 mg once a day.  statin. I will use low-dose atorvastatin secondary to advanced age.   2. Agree with DO NOT RESUSCITATE given her increased morbidity/mortality/advanced age.  3. Essential hypertension-currently well controlled  4. Alzheimer's dementia-per primary team.  5. Recent gastric ulcer status post repair by  general surgery with omental patch in August 18. Watch for any signs of bleeding especially with comminution of aspirin and Plavix.  6. Brief PAT - Toprol.      SKAINS, [place] 06/04/2015, 8:06 AM

## 2015-06-04 NOTE — Progress Notes (Signed)
ANTICOAGULATION CONSULT NOTE - Follow Up Consult  Pharmacy Consult for Heparin IV infusion Indication: chest pain/ACS  Allergies  Allergen Reactions  . Nsaids Other (See Comments)    Perforated gastric ulcer  . Amlodipine Besylate     REACTION: rash and itch  . Benzodiazepines Other (See Comments)    Hx of overuse/memory issue  . Penicillins   . Sulfonamide Derivatives     Patient Measurements: Height: 5\' 3"  (160 cm) Weight: 99 lb 10.4 oz (45.2 kg) IBW/kg (Calculated) : 52.4 Heparin Dosing Weight: total body weight  Vital Signs: Temp: 98.2 F (36.8 C) (09/11 0624) Temp Source: Oral (09/11 0624) BP: 134/46 mmHg (09/11 0624) Pulse Rate: 90 (09/11 0624)  Labs:  Recent Labs  06/02/15 1220 06/02/15 1507 06/02/15 1950  06/03/15 0056 06/03/15 0500 06/03/15 0716 06/03/15 1010 06/03/15 2100 06/04/15 0535  HGB 9.8*  --   --   --   --  8.8*  --   --   --  7.9*  HCT 31.3*  --   --   --   --  29.0*  --   --   --  25.6*  PLT 692*  --   --   --   --  637*  --   --   --  587*  APTT  --  37  --   --   --   --   --   --   --   --   LABPROT  --  14.2  --   --   --   --   --   --   --   --   INR  --  1.08  --   --   --   --   --   --   --   --   HEPARINUNFRC  --   --   --   < > 0.26*  --   --  0.11* 0.33 0.31  CREATININE 0.77  --   --   --   --   --   --   --   --   --   TROPONINI 0.27*  --  0.28*  --  0.24*  --  0.24*  --   --   --   < > = values in this interval not displayed.  Estimated Creatinine Clearance: 30.7 mL/min (by C-G formula based on Cr of 0.77).   Assessment: Pt is a 81 yo female diagnosed with ACS. Pt with elevated troponin. Pt had recent admission on 05/12/15 for perforated viscus. Pt did have post-op anemia for which she received 2 PRBC. No listed anticoagulants on PTA meds.   Today, 06/04/2015  Heparin level therapeutic x 2 on current rate of 850 units/hr  No complications   CBC: Hgb trending down - anticipate heparin being stopped today (as per  cardiology note)  Goal of Therapy:  Heparin level 0.3-0.7 units/ml, shoot for lower end d/t recent gastric ulcer Monitor platelets by anticoagulation protocol: Yes   Plan:   Continue IV heparin at 850 units/hr  Per cardiology, plan is to stop heparin this morning  Note recent gastric ulcer and omental patch repair 05/11/15 (on ASA and plavix)  Monitor for bleeding  Follow daily heparin level & CBC  Doreene Eland, PharmD, BCPS.   Pager: [phone] 06/04/2015 7:11 AM   Addendum: Heparin has been d/c'd by cardiology. D/C heparin labs  Doreene Eland, PharmD, BCPS.   Pager: [phone] 06/04/2015 8:56 AM

## 2015-06-04 NOTE — Progress Notes (Signed)
PCP on call notified of patient's low urine output today. Awaiting any orders.

## 2015-06-05 LAB — BASIC METABOLIC PANEL
Anion gap: 9 (ref 5–15)
BUN: 21 mg/dL — AB (ref 6–20)
CALCIUM: 8.1 mg/dL — AB (ref 8.9–10.3)
CO2: 20 mmol/L — AB (ref 22–32)
CREATININE: 0.71 mg/dL (ref 0.44–1.00)
Chloride: 114 mmol/L — ABNORMAL HIGH (ref 101–111)
GFR calc non Af Amer: >60 mL/min (ref >60)
GLUCOSE: 103 mg/dL — AB (ref 65–99)
Potassium: 3.7 mmol/L (ref 3.5–5.1)
Sodium: 143 mmol/L (ref 135–145)

## 2015-06-05 LAB — CBC
HCT: 26.9 % — ABNORMAL LOW (ref 36.0–46.0)
Hemoglobin: 8.3 g/dL — ABNORMAL LOW (ref 12.0–15.0)
MCH: 30.6 pg (ref 26.0–34.0)
MCHC: 30.9 g/dL (ref 30.0–36.0)
MCV: 99.3 fL (ref 78.0–100.0)
Platelets: 524 10*3/uL — ABNORMAL HIGH (ref 150–400)
RBC: 2.71 MIL/uL — ABNORMAL LOW (ref 3.87–5.11)
RDW: 16.5 % — AB (ref 11.5–15.5)
WBC: 12.5 10*3/uL — ABNORMAL HIGH (ref 4.0–10.5)

## 2015-06-05 MED ORDER — CIPROFLOXACIN HCL 500 MG PO TABS
500.0000 mg | ORAL_TABLET | Freq: Two times a day (BID) | ORAL | Status: DC
  Administered 2015-06-05 (×2): 500 mg via ORAL
  Filled 2015-06-05 (×2): qty 1

## 2015-06-05 MED ORDER — DEXTROSE-NACL 5-0.45 % IV SOLN
INTRAVENOUS | Status: DC
  Administered 2015-06-05 – 2015-06-08 (×5): via INTRAVENOUS

## 2015-06-05 MED ORDER — SODIUM CHLORIDE 0.9 % IV BOLUS (SEPSIS)
500.0000 mL | Freq: Once | INTRAVENOUS | Status: AC
  Administered 2015-06-05: 500 mL via INTRAVENOUS

## 2015-06-05 NOTE — Progress Notes (Signed)
Patient Profile: 81 year old female with non-ST elevation myocardial infarction, very mild troponin elevation, T-wave inversions noted on EKG possible anterior wall distribution with underlying Alzheimer's. Echo reassuring with EF at 55-60%. Medical therapy elected given advanced age and Alzheimer's.   Subjective: Resting comfortably. No chest pain.   Objective: Vital signs in last 24 hours: Temp:  [97.6 F (36.4 C)-99.2 F (37.3 C)] 99.2 F (37.3 C) (09/12 0504) Pulse Rate:  [69-87] 85 (09/12 0504) Resp:  [16-18] 18 (09/12 0504) BP: (102-137)/(42-85) 137/56 mmHg (09/12 0504) SpO2:  [97 %-100 %] 100 % (09/12 0504) Last BM Date: 06/03/15  Intake/Output from previous day: 09/11 0701 - 09/12 0700 In: 340 [P.O.:340] Out: 300 [Urine:300] Intake/Output this shift:    Medications Current Facility-Administered Medications  Medication Dose Route Frequency Provider Last Rate Last Dose  . acetaminophen (TYLENOL) tablet 500 mg  500 mg Oral 3 times per day Nishant Dhungel, MD   500 mg at 06/05/15 0507  . acetaminophen (TYLENOL) tablet 650 mg  650 mg Oral Q4H PRN Nishant Dhungel, MD   650 mg at 06/04/15 1829  . aspirin EC tablet 81 mg  81 mg Oral Daily Nishant Dhungel, MD   81 mg at 06/05/15 0919  . atorvastatin (LIPITOR) tablet 10 mg  10 mg Oral q1800 Jerline Pain, MD   10 mg at 06/04/15 1829  . ciprofloxacin (CIPRO) tablet 500 mg  500 mg Oral BID Belkys A Regalado, MD      . clopidogrel (PLAVIX) tablet 75 mg  75 mg Oral Q breakfast Nishant Dhungel, MD   75 mg at 06/05/15 0921  . dextrose 5 %-0.45 % sodium chloride infusion   Intravenous Continuous Belkys A Regalado, MD      . feeding supplement (ENSURE ENLIVE) (ENSURE ENLIVE) liquid 237 mL  237 mL Oral BID BM Nishant Dhungel, MD   237 mL at 06/05/15 0921  . gabapentin (NEURONTIN) capsule 100 mg  100 mg Oral TID Nishant Dhungel, MD   100 mg at 06/05/15 0921  . hydrALAZINE (APRESOLINE) tablet 50 mg  50 mg Oral 3 times per day Nishant  Dhungel, MD   50 mg at 06/05/15 0507  . memantine (NAMENDA XR) 24 hr capsule 28 mg  28 mg Oral Daily Nishant Dhungel, MD   28 mg at 06/05/15 0921  . metoprolol succinate (TOPROL-XL) 24 hr tablet 25 mg  25 mg Oral Daily Jerline Pain, MD   25 mg at 06/05/15 0919  . nitroGLYCERIN (NITROSTAT) SL tablet 0.4 mg  0.4 mg Sublingual Q5 Min x 3 PRN Nishant Dhungel, MD      . ondansetron (ZOFRAN) injection 4 mg  4 mg Intravenous Q6H PRN Nishant Dhungel, MD   4 mg at 06/03/15 0004  . pantoprazole (PROTONIX) EC tablet 40 mg  40 mg Oral BID AC Nishant Dhungel, MD   40 mg at 06/05/15 0920  . traMADol (ULTRAM) tablet 50 mg  50 mg Oral Q12H PRN Nishant Dhungel, MD   50 mg at 06/05/15 0309    PE: General appearance: alert, cooperative and no distress Neck: no carotid bruit and no JVD Lungs: clear to auscultation bilaterally Heart: regular rate and rhythm, S1, S2 normal, no murmur, click, rub or gallop Extremities: no LEE Pulses: 2+ and symmetric Skin: warm and dry Neurologic: Alzheimer's at baseline  Lab Results:   Recent Labs  06/03/15 0500 06/04/15 0535 06/05/15 0448  WBC 14.3* 13.3* 12.5*  HGB 8.8* 7.9* 8.3*  HCT 29.0* 25.6* 26.9*  PLT 637* 587* 524*   BMET  Recent Labs  06/02/15 1220 06/05/15 0448  NA 146* 143  K 3.5 3.7  CL 111 114*  CO2 23 20*  GLUCOSE 101* 103*  BUN 22* 21*  CREATININE 0.77 0.71  CALCIUM 8.3* 8.1*   PT/INR  Recent Labs  06/02/15 1507  LABPROT 14.2  INR 1.08     Assessment/Plan  Principal Problem:   NSTEMI (non-ST elevated myocardial infarction) Active Problems:   Essential hypertension   GERD   Severe protein-calorie malnutrition   Alzheimer's disease   Gastric ulcer with perforation s/p open omental patch 05/11/2015   1. Non-ST elevation myocardial infarction - ECHO reassuring - normal EF 55-60%.  - Noninvasive strategy, continue with medical management - Continue aspirin, Plavix, low-dose beta blocker given her sinus tachycardia on  telemetry transiently. Toprol-XL 25 mg once a day. Statin. I will use low-dose atorvastatin secondary to advanced age.  2. DO NOT RESUSCITATE - agree with CODE status given her increased morbidity/mortality/advanced age.  3. Essential hypertension-currently well controlled  4. Alzheimer's dementia-per primary team.  5. Recent gastric ulcer: status post repair by general surgery with omental patch in August 18. Watch for any signs of bleeding especially with comminution of aspirin and Plavix.  6. Brief PAT - Toprol.     LOS: 3 days    Deuntae Kocsis M. Rosita Fire, PA-C 06/05/2015 10:00 AM

## 2015-06-05 NOTE — Care Management Important Message (Signed)
Important Message  Patient Details  Name: Luverne Farone MRN: [id number] Date of Birth: 26-Jan-1919   Medicare Important Message Given:  Yes-second notification given    Camillo Flaming 06/05/2015, 12:31 [place] Message  Patient Details  Name: Vennie Salsbury MRN: [id number] Date of Birth: 1919/07/14   Medicare Important Message Given:  Yes-second notification given    Camillo Flaming 06/05/2015, 12:31 PM

## 2015-06-05 NOTE — Progress Notes (Signed)
Initial Nutrition Assessment  DOCUMENTATION CODES:   Severe malnutrition in context of chronic illness, Underweight  INTERVENTION:  - Continue Ensure Enlive BID - Will order Magic cup TID with meals, each supplement provides 290 kcal and 9 grams of protein - RD will continue to monitor  NUTRITION DIAGNOSIS:   Inadequate oral intake related to lethargy/confusion, poor appetite as evidenced by meal completion < 25%.  GOAL:   Patient will meet greater than or equal to 90% of their needs  MONITOR:   PO intake, Supplement acceptance, Weight trends, Labs, I & O's  REASON FOR ASSESSMENT:   Malnutrition Screening Tool  ASSESSMENT:   81 year old female with severe Alzheimer's dementia with history of hypertension, hyperlipidemia, recent perforated gastric ulcer repaired with an omental patch on 05/11/2015, anxiety and depression, osteoarthritis who brought to the ED by family after patient complained of chest pain and right leg pain. Patient at baseline is wheelchair-bound and daughter denies any fall or trauma. As per family patient complaining of chest pain and abdominal pain yesterday...   Pt seen for MST. BMI indicates underweight status. Per chart review, pt has been eating 0-10% since admission.   She was sleeping at time of visit with no family/visitors present. Did not arouse pt as noted indicate hx of severe Alzheimer's dementia. Visualized moderate fat and severe muscle wasting but did not complete physical exam as not to awake pt.   Visualized breakfast tray at bedside with 0% completion of oatmeal and fruit, <25% completion Ensure Enlive, and 50% completion of Magic Cup.  Per chart review, pt has lost 18 lbs (15% body weight) in the past 3 weeks; no weight recorded since March 2015 prior to current weight and weight from 05/12/15. Will need to monitor.   Not meeting needs. Medications reviewed. Labs reviewed; Cl: 114 mmol/L, BUN elevated, Ca: 8.1 mg/dL.   Diet Order:  Diet  Heart Room service appropriate?: Yes; Fluid consistency:: Thin  Skin:  Wound (see comment) (abdominal wound from surgery PTA)  Last BM:  9/10  Height:   Ht Readings from Last 1 Encounters:  06/02/15 5\' 3"  (1.6 m)    Weight:   Wt Readings from Last 1 Encounters:  06/02/15 99 lb 10.4 oz (45.2 kg)    Ideal Body Weight:  52.27 kg (kg)  BMI:  Body mass index is 17.66 kg/(m^2).  Estimated Nutritional Needs:   Kcal:  1610-9604  Protein:  45-55 grams  Fluid:  1.8-2.2 L/day  EDUCATION NEEDS:   No education needs identified at this time     Jarome Matin, RD, LDN Inpatient Clinical Dietitian Pager # [phone] After hours/weekend pager # [phone]

## 2015-06-05 NOTE — Progress Notes (Signed)
[facility] PROGRESS NOTE  Elizabeth Peterson FIE:[id number] DOB: [birth year] DOA: 06/02/2015 PCP: Hollace Kinnier, DO  Brief narrative 81 year old female with severe Alzheimer's dementia with history of hypertension, hyperlipidemia, recent perforated gastric ulcer repaired with an omental patch on 05/11/2015, anxiety and depression, osteoarthritis who brought to the ED by family after patient complained of chest pain and right leg pain. Patient at baseline is wheelchair-bound and daughter denies any fall or trauma. As per family patient complaining of chest pain and abdominal pain 1 day prior to admission.  Course in the ED  Vitals  stable. Blood work done showed mild leukocytosis, hemoglobin of 9.8, platelets of 692. Chemistry showed sodium of 146, potassium 3.5, BUN of 22 0.77. Troponin was elevated 0.27. EKG showed normal sinus rhythm with ST depression in anterior leads and deep T-wave inversion in inferior and lateral leads concerning for NSTEMI. Head CT was unremarkable for acute abnormality. X-ray of the right hip was negative for any fracture. Chest x-ray was unremarkable.  Cardiology consulted who recommended medical management given her advanced age with severe dementia and poor functional status. Started on IV heparin after approved by surgery.   Assessment/Plan:  NSTEMI (non-ST elevated myocardial infarction) -Was Started on IV heparin drip after cleared by surgery (given recent perforated peptic ulcer with repair). Heparin drip discontinued 9-11.  -Plan on medical management.  Troponin peaked at 0.28.. -Added low dose beta blocker and statin. 2-D echo with EF of 55-60% with grade 1 diastolic dysfunction. Continue aspirin, Plavix.   Essential hypertension Continue home dose hydralazine. Added low dose beta blocker     GERD with ulcerated gastric ulcer status post open omental patch on 05/11/2015  stable. on PPI twice a day. Monitor H&H Hb stable.    Severe protein-calorie  malnutrition Added supplements.   Alzheimer's disease with severe dementia Continue Namenda  Leukocytosis No clinical sign of infection.  UA with 3 to 6 WBC. cipro empirically, follow culture.   Decrease urine out put; foley place 9-11. IV fluids started. Needs to DC foley in 24 hours if urine out put increases. Follow B-Met.  Right hip pain X-ray on admission unremarkable. Continue scheduled Tylenol alternating with when necessary tramadol.    Diet:cardiac  DVT prophylaxis: sq lovenox  Consult Cardiology   Code Status: DO NOT RESUSCITATE (after discussing in detail with patient's daughter [name] on the phone) Family Communication: discussed with patient.  Disposition Plan: Continue telemetry monitoring. Home in 1-2 days.   HPI/Subjective: Patient seen and examined.= denies pain. Denies chest pain.  Decrease urine out put per report..   Objective: Filed Vitals:   06/05/15 0504  BP: 137/56  Pulse: 85  Temp: 99.2 F (37.3 C)  Resp: 18    Intake/Output Summary (Last 24 hours) at 06/05/15 0946 Last data filed at 06/05/15 1660  Gross per 24 hour  Intake    340 ml  Output    300 ml  Net     40 ml   Filed Weights   06/02/15 2000  Weight: 45.2 kg (99 lb 10.4 oz)    Exam:   General:  Elderly demented female not in distress  HEENT: No pallor, temporal wasting, moist mucosa  Cardiovascular: Normal S1 and S2, no murmurs rub or gallop  Respiratory: Clear bilaterally  Abdomen: Soft, nondistended, nontender, bowel sounds present  Musculoskeletal: Warm, no edema, nontender  CNS: Demented   Data Reviewed: Basic Metabolic Panel:  Recent Labs Lab 06/02/15 1220  NA 146*  K 3.5  CL 111  CO2 23  GLUCOSE 101*  BUN 22*  CREATININE 0.77  CALCIUM 8.3*   Liver Function Tests:  Recent Labs Lab 06/02/15 1220  AST 31  ALT 14  ALKPHOS 71  BILITOT 0.8  PROT 7.6  ALBUMIN 2.4*   No results for input(s): LIPASE, AMYLASE in the last 168 hours. No  results for input(s): AMMONIA in the last 168 hours. CBC:  Recent Labs Lab 06/02/15 1220 06/03/15 0500 06/04/15 0535 06/05/15 0448  WBC 13.6* 14.3* 13.3* 12.5*  NEUTROABS 11.1*  --   --   --   HGB 9.8* 8.8* 7.9* 8.3*  HCT 31.3* 29.0* 25.6* 26.9*  MCV 96.0 101.0* 98.5 99.3  PLT 692* 637* 587* 524*   Cardiac Enzymes:  Recent Labs Lab 06/02/15 1220 06/02/15 1950 06/03/15 0056 06/03/15 0716  TROPONINI 0.27* 0.28* 0.24* 0.24*   BNP (last 3 results)  Recent Labs  05/16/15 0705  BNP 3210.9*    ProBNP (last 3 results) No results for input(s): PROBNP in the last 8760 hours.  CBG: No results for input(s): GLUCAP in the last 168 hours.  No results found for this or any previous visit (from the past 240 hour(s)).   Studies: No results found.  Scheduled Meds: . acetaminophen  500 mg Oral 3 times per day  . aspirin EC  81 mg Oral Daily  . atorvastatin  10 mg Oral q1800  . ciprofloxacin  500 mg Oral BID  . clopidogrel  75 mg Oral Q breakfast  . feeding supplement (ENSURE ENLIVE)  237 mL Oral BID BM  . gabapentin  100 mg Oral TID  . hydrALAZINE  50 mg Oral 3 times per day  . memantine  28 mg Oral Daily  . metoprolol succinate  25 mg Oral Daily  . pantoprazole  40 mg Oral BID AC   Continuous Infusions: . dextrose 5 % and 0.45% NaCl         Time spent: 25 minutes    Brylin Stopper A  [facility] Pager [phone] 7PM-7AM, please contact night-coverage at [email or web address], password [facility] 06/05/2015, 9:46 AM  LOS: 3 days

## 2015-06-06 ENCOUNTER — Inpatient Hospital Stay (HOSPITAL_COMMUNITY): Payer: Medicare Other

## 2015-06-06 LAB — CBC
HEMATOCRIT: 25.6 % — AB (ref 36.0–46.0)
HEMOGLOBIN: 8 g/dL — AB (ref 12.0–15.0)
MCH: 30.4 pg (ref 26.0–34.0)
MCHC: 31.3 g/dL (ref 30.0–36.0)
MCV: 97.3 fL (ref 78.0–100.0)
Platelets: 596 10*3/uL — ABNORMAL HIGH (ref 150–400)
RBC: 2.63 MIL/uL — AB (ref 3.87–5.11)
RDW: 16.3 % — ABNORMAL HIGH (ref 11.5–15.5)
WBC: 13.2 10*3/uL — AB (ref 4.0–10.5)

## 2015-06-06 LAB — BASIC METABOLIC PANEL
ANION GAP: 8 (ref 5–15)
BUN: 15 mg/dL (ref 6–20)
CALCIUM: 7.9 mg/dL — AB (ref 8.9–10.3)
CO2: 20 mmol/L — AB (ref 22–32)
Chloride: 110 mmol/L (ref 101–111)
Creatinine, Ser: 0.6 mg/dL (ref 0.44–1.00)
GFR calc non Af Amer: >60 mL/min (ref >60)
Glucose, Bld: 105 mg/dL — ABNORMAL HIGH (ref 65–99)
POTASSIUM: 3.3 mmol/L — AB (ref 3.5–5.1)
Sodium: 138 mmol/L (ref 135–145)

## 2015-06-06 MED ORDER — POTASSIUM CHLORIDE CRYS ER 20 MEQ PO TBCR
40.0000 meq | EXTENDED_RELEASE_TABLET | Freq: Once | ORAL | Status: AC
  Administered 2015-06-06: 40 meq via ORAL
  Filled 2015-06-06: qty 2

## 2015-06-06 MED ORDER — CLARITHROMYCIN 500 MG PO TABS
500.0000 mg | ORAL_TABLET | Freq: Two times a day (BID) | ORAL | Status: DC
  Administered 2015-06-06 – 2015-06-08 (×5): 500 mg via ORAL
  Filled 2015-06-06 (×6): qty 1

## 2015-06-06 MED ORDER — SACCHAROMYCES BOULARDII 250 MG PO CAPS
250.0000 mg | ORAL_CAPSULE | Freq: Two times a day (BID) | ORAL | Status: DC
  Administered 2015-06-06 – 2015-06-08 (×4): 250 mg via ORAL
  Filled 2015-06-06 (×4): qty 1

## 2015-06-06 MED ORDER — METRONIDAZOLE 500 MG PO TABS
500.0000 mg | ORAL_TABLET | Freq: Two times a day (BID) | ORAL | Status: DC
  Administered 2015-06-06 – 2015-06-08 (×5): 500 mg via ORAL
  Filled 2015-06-06 (×5): qty 1

## 2015-06-06 MED ORDER — DOXYCYCLINE HYCLATE 100 MG PO TABS
100.0000 mg | ORAL_TABLET | Freq: Two times a day (BID) | ORAL | Status: DC
  Administered 2015-06-06: 100 mg via ORAL
  Filled 2015-06-06: qty 1

## 2015-06-06 MED ORDER — POLYETHYLENE GLYCOL 3350 17 G PO PACK
17.0000 g | PACK | Freq: Every day | ORAL | Status: DC
  Administered 2015-06-06 – 2015-06-08 (×3): 17 g via ORAL
  Filled 2015-06-06 (×3): qty 1

## 2015-06-06 MED ORDER — BISACODYL 10 MG RE SUPP
10.0000 mg | Freq: Once | RECTAL | Status: AC
  Administered 2015-06-06: 10 mg via RECTAL
  Filled 2015-06-06: qty 1

## 2015-06-06 NOTE — Evaluation (Signed)
Occupational Therapy Evaluation Patient Details Name: Elizabeth Peterson MRN: [id number] DOB: 03/22/1919 Today's Date: 06/06/2015    History of Present Illness 81 year old female with PMHx of severe Alzheimer's dementia, hypertension, hyperlipidemia, recent perforated gastric ulcer repaired with an omental patch on 05/11/2015, anxiety and depression, osteoarthritis who brought to the ED by family after patient complained of chest pain and right leg pain; admitted 06/02/15 for NSTEMI   Clinical Impression   Pt admitted with above.  She was very lethargic during OT eval.  Family present.  Pt currently, requires total A for all ADLs, which is her baseline.  Son reports that she has been requiring total A for ADLs, and all aspects of mobility.  Pt's niece and sister lift pt from bed to wheelchair.  They are unsure if they can continue to provide necessary level of care and will continue to discuss with each other.  IF pt discharges home, they will benefit from a hoyer lift.  IF family unable to continue to provide total care, she will need SNF.   No further OT recommended due to pt at baseline status.     Follow Up Recommendations  SNF;Supervision/Assistance - 24 hour    Equipment Recommendations  Other (comment) (hoyer lift )    Recommendations for Other Services       Precautions / Restrictions Precautions Precautions: Fall Precaution Comments: knees contracted Restrictions Weight Bearing Restrictions: No      Mobility Bed Mobility Overal bed mobility: Needs Assistance Bed Mobility: Sidelying to Sit;Sit to Sidelying;Rolling Rolling: Total assist Sidelying to sit: Total assist;HOB elevated     Sit to sidelying: Max assist General bed mobility comments: total A for bed mobility   Transfers Overall transfer level:  (not tested for safety)                    Balance Overall balance assessment: Needs assistance Sitting-balance support: Bilateral upper extremity  supported;Feet supported Sitting balance-Leahy Scale: Poor Sitting balance - Comments: pt was able to sit EOB without trunk support however required bil UE support, pt itching back so cleaned with washcloth and applied lotion (approx 4 min), pt fatigued quickly and assited back to bed                                    ADL Overall ADL's : Needs assistance/impaired                                       General ADL Comments: Pt requires max A - total A for ADLs      Vision     Perception     Praxis      Pertinent Vitals/Pain Pain Assessment: Faces Faces Pain Scale: No hurt Pain Location: bil legs Pain Descriptors / Indicators: Discomfort;Moaning;Grimacing Pain Intervention(s): Limited activity within patient's tolerance;Monitored during session;Repositioned     Hand Dominance Right   Extremity/Trunk Assessment Upper Extremity Assessment Upper Extremity Assessment: Generalized weakness   Lower Extremity Assessment Lower Extremity Assessment: Defer to PT evaluation RLE Deficits / Details: knee flexion contractures, tender to any touch of LEs and donning socks, observed L medial heel redness however unable to inspect R foot as well due to pt positioning (RN notified) RLE: Unable to fully assess due to pain LLE Deficits / Details: same LLE: Unable to fully assess due  to pain   Cervical / Trunk Assessment Cervical / Trunk Assessment: Kyphotic   Communication Communication Communication: HOH   Cognition Arousal/Alertness: Lethargic Behavior During Therapy: WFL for tasks assessed/performed Overall Cognitive Status: History of cognitive impairments - at baseline                     General Comments       Exercises       Shoulder Instructions      Home Living Family/patient expects to be discharged to:: Private residence Living Arrangements: Other relatives (niece and sister ) Available Help at Discharge: Family;Available 24  hours/day Type of Home: House                       Home Equipment: Bedside commode;Wheelchair - manual;Hospital bed   Additional Comments: Pt's niece and aunt provide 24 hour assist.        Prior Functioning/Environment Level of Independence: Needs assistance  Gait / Transfers Assistance Needed: Pt is non ambulatory and has been w/c/bed bound for quite some time .  She requires total A for all aspects of mobility per her son, and often screams and cries in pain  ADL's / Homemaking Assistance Needed: Pt requires total A for all ADLs including self feeding         OT Diagnosis: Generalized weakness;Cognitive deficits;Acute pain;Other (comment) (chronic pain )   OT Problem List:     OT Treatment/Interventions:      OT Goals(Current goals can be found in the care plan section) Acute Rehab OT Goals OT Goal Formulation: All assessment and education complete, DC therapy  OT Frequency:     Barriers to D/C:            Co-evaluation              End of Session    Activity Tolerance: Patient limited by lethargy Patient left: in bed;with call bell/phone within reach;with family/visitor present   Time: 1310-1320 OT Time Calculation (min): 10 min Charges:  OT General Charges $OT Visit: 1 Procedure OT Evaluation $Initial OT Evaluation Tier I: 1 Procedure G-Codes:    Lucille Passy M 06/16/15, 1:35 PM

## 2015-06-06 NOTE — Progress Notes (Signed)
Subjective: Ask to see again because she was having abdominal pain.  First time i spoke to her she didn't complain of abdominal pain.  She said she just hurt.  I looked at abdomen and she has on open area.  I went to get some supplies and when I came back she was complaining.  She cannot remember eating or last BM.  Her abdomen is soft she is on PPI.  H pylori was positive. i do not see where she went home on treatment for h pylori.  Nursing says she is about the same as she has been.  Confused, she has pain sometimes and sometimes she does not.    Objective: Vital signs in last 24 hours: Temp:  [98 F (36.7 C)-98.8 F (37.1 C)] 98.6 F (37 C) (09/13 0501) Pulse Rate:  [87-100] 95 (09/13 0501) Resp:  [16-18] 16 (09/13 0501) BP: (115-124)/(42-62) 119/42 mmHg (09/13 0501) SpO2:  [98 %-99 %] 98 % (09/13 0501) Last BM Date: 06/03/15 430 PO recorded yesterday Last stool recorded 06/02/15 Nursing currently has not seen a Bm for at least 2 days. Cardiac diet Afebrile, VSS K+ 3.3 WBC is up 13.2  H/h 8/25.6 Intake/Output from previous day: 09/12 0701 - 09/13 0700 In: 1875 [P.O.:430; I.V.:1445] Out: 625 [Urine:625] Intake/Output this shift: Total I/O In: 120 [P.O.:120] Out: -   General appearance: alert, no distress and confused, complaining on and off of pain. Resp: clear to auscultation bilaterally GI: soft, she complains of pain on and off, no specific site is reproducible.  She is soft to palpation, + BS.    Lab Results:   Recent Labs  06/05/15 0448 06/06/15 0700  WBC 12.5* 13.2*  HGB 8.3* 8.0*  HCT 26.9* 25.6*  PLT 524* 596*    BMET  Recent Labs  06/05/15 0448 06/06/15 0700  NA 143 138  K 3.7 3.3*  CL 114* 110  CO2 20* 20*  GLUCOSE 103* 105*  BUN 21* 15  CREATININE 0.71 0.60  CALCIUM 8.1* 7.9*   PT/INR No results for input(s): LABPROT, INR in the last 72 hours.   Recent Labs Lab 06/02/15 1220  AST 31  ALT 14  ALKPHOS 71  BILITOT 0.8  PROT 7.6   ALBUMIN 2.4*     Lipase     Component Value Date/Time   LIPASE 441* 05/11/2015 0305     Studies/Results: No results found.  Medications: . acetaminophen  500 mg Oral 3 times per day  . aspirin EC  81 mg Oral Daily  . atorvastatin  10 mg Oral q1800  . clopidogrel  75 mg Oral Q breakfast  . doxycycline  100 mg Oral Q12H  . feeding supplement (ENSURE ENLIVE)  237 mL Oral BID BM  . gabapentin  100 mg Oral TID  . hydrALAZINE  50 mg Oral 3 times per day  . memantine  28 mg Oral Daily  . metoprolol succinate  25 mg Oral Daily  . pantoprazole  40 mg Oral BID AC  . potassium chloride  40 mEq Oral Once    Assessment/Plan Abdominal pain with hx of gastric perforated ulcer; s/p diagnostic laparoscopy converted to open repair gastric ulcer and omental patch, 05/11/15, Dr. Joyice Faster (hospitalized 8/18-8/29/16) H pylori Positive 05/16/15. S/p Non STEMI 06/02/15 GERD  Severe PCM Alzheimer's disease with dementia Constipation  Plan:  I think we should give her something for her constipation, Dr. Nestor Ramp is going to get some films.  I think we could also treat her  for the H Pylori.  I cannot see where she was treated before.  She got Flagyl on admit, and levofloxacin for 6 days at d/c during her last admit for the perforation.  She has a penicillin allergy so she can go on  Clarithromycin 500 bid, flagyl 500 bid, and continue PPI for 14 days.  I will wait for film,but I don't expect it to show much.        LOS: 4 days    Shineka Auble 06/06/2015

## 2015-06-06 NOTE — Evaluation (Signed)
Physical Therapy Evaluation Patient Details Name: Elizabeth Peterson MRN: [id number] DOB: 01/04/1919 Today's Date: 06/06/2015   History of Present Illness  81 year old female with PMHx of severe Alzheimer's dementia, hypertension, hyperlipidemia, recent perforated gastric ulcer repaired with an omental patch on 05/11/2015, anxiety and depression, osteoarthritis who brought to the ED by family after patient complained of chest pain and right leg pain; admitted 06/02/15 for NSTEMI  Clinical Impression  Pt admitted with above diagnosis. Pt currently with functional limitations due to the deficits listed below (see PT Problem List). Pt will benefit from skilled PT to increase their independence and safety with mobility to allow discharge to the venue listed below.   No family present at time of evaluation.  Pt assisted to sitting however fatigued quickly.  Per recent admission notes, pt lives at home with family assist and w/c bound, nonambulatory at baseline.  If family able to provide increased assist, recommend HHPT otherwise pt may need SNF.      Follow Up Recommendations Home health PT;Supervision/Assistance - 24 hour (if family able to provide assist)    [facility] bed (recommended previous recent admission as well)    Recommendations for Other Services       Precautions / Restrictions Precautions Precautions: Fall Precaution Comments: knees contracted Restrictions Weight Bearing Restrictions: No      Mobility  Bed Mobility Overal bed mobility: Needs Assistance Bed Mobility: Sidelying to Sit;Sit to Sidelying;Rolling Rolling: Total assist Sidelying to sit: Total assist;HOB elevated     Sit to sidelying: Max assist General bed mobility comments: Assist for trunk to upright and bil LEs off bed. Utilized bedpad for positioning.  Pt c/o bil LE pain mainly. Multimodal cues for participation however pt was not initiating or assisting until return to  supine  Transfers Overall transfer level:  (not tested for safety)                  Ambulation/Gait                Stairs            Wheelchair Mobility    Modified Rankin (Stroke Patients Only)       Balance Overall balance assessment: Needs assistance Sitting-balance support: Bilateral upper extremity supported;Feet supported Sitting balance-Leahy Scale: Poor Sitting balance - Comments: pt was able to sit EOB without trunk support however required bil UE support, pt itching back so cleaned with washcloth and applied lotion (approx 4 min), pt fatigued quickly and assited back to bed                                     Pertinent Vitals/Pain Pain Assessment: Faces Faces Pain Scale: Hurts even more Pain Location: bil legs Pain Descriptors / Indicators: Discomfort;Moaning;Grimacing Pain Intervention(s): Limited activity within patient's tolerance;Monitored during session;Repositioned    Home Living Family/patient expects to be discharged to:: Private residence Living Arrangements: Children Available Help at Discharge: Family;Available 24 hours/day Type of Home: House         Home Equipment: Bedside commode;Wheelchair - manual Additional Comments: no family present, information from previous admission    Prior Function Level of Independence: Needs assistance   Gait / Transfers Assistance Needed: prior to recent admision: pt could transfer to [facility] independently at times, assisted to [facility], non ambulatory           Hand Dominance  Extremity/Trunk Assessment   Upper Extremity Assessment: Generalized weakness           Lower Extremity Assessment: RLE deficits/detail;LLE deficits/detail RLE Deficits / Details: knee flexion contractures, tender to any touch of LEs and donning socks, observed L medial heel redness however unable to inspect R foot as well due to pt positioning (RN notified) LLE Deficits / Details:  same  Cervical / Trunk Assessment: Kyphotic  Communication   Communication: HOH  Cognition Arousal/Alertness: Awake/alert Behavior During Therapy: WFL for tasks assessed/performed Overall Cognitive Status: No family/caregiver present to determine baseline cognitive functioning (hx of dementia)                      General Comments      Exercises        Assessment/Plan    PT Assessment Patient needs continued PT services  PT Diagnosis Generalized weakness   PT Problem List Decreased strength;Decreased range of motion;Decreased activity tolerance;Decreased mobility;Decreased cognition;Decreased balance;Decreased safety awareness;Decreased knowledge of precautions;Pain  PT Treatment Interventions DME instruction;Functional mobility training;Patient/family education;Therapeutic activities;Therapeutic exercise;Wheelchair mobility training   PT Goals (Current goals can be found in the Care Plan section) Acute Rehab PT Goals PT Goal Formulation: With patient Time For Goal Achievement: 06/13/15 Potential to Achieve Goals: Fair    Frequency Min 2X/week   Barriers to discharge        Co-evaluation               End of Session   Activity Tolerance: Patient limited by pain Patient left: with call bell/phone within reach;with bed alarm set;in bed Nurse Communication: Mobility status         Time: 2703-5009 PT Time Calculation (min) (ACUTE ONLY): 16 min   Charges:   PT Evaluation $Initial PT Evaluation Tier I: 1 Procedure     PT G Codes:        Saheed Carrington,KATHrine E 06/06/2015, 12:32 PM Carmelia Bake, PT, DPT 06/06/2015 Pager: [phone]

## 2015-06-06 NOTE — Progress Notes (Signed)
[facility] PROGRESS NOTE  Lonie Rummell TAV:[id number] DOB: 24-Mar-1919 DOA: 06/02/2015 PCP: Hollace Kinnier, DO  Brief narrative 81 year old female with severe Alzheimer's dementia with history of hypertension, hyperlipidemia, recent perforated gastric ulcer repaired with an omental patch on 05/11/2015, anxiety and depression, osteoarthritis who brought to the ED by family after patient complained of chest pain and right leg pain. Patient at baseline is wheelchair-bound and daughter denies any fall or trauma. As per family patient complaining of chest pain and abdominal pain 1 day prior to admission.  Course in the ED  Vitals  stable. Blood work done showed mild leukocytosis, hemoglobin of 9.8, platelets of 692. Chemistry showed sodium of 146, potassium 3.5, BUN of 22 0.77. Troponin was elevated 0.27. EKG showed normal sinus rhythm with ST depression in anterior leads and deep T-wave inversion in inferior and lateral leads concerning for NSTEMI. Head CT was unremarkable for acute abnormality. X-ray of the right hip was negative for any fracture. Chest x-ray was unremarkable.  Cardiology consulted who recommended medical management given her advanced age with severe dementia and poor functional status. Started on IV heparin after approved by surgery.   Assessment/Plan:  NSTEMI (non-ST elevated myocardial infarction) -Was Started on IV heparin drip after cleared by surgery (given recent perforated peptic ulcer with repair). Heparin drip discontinued 9-11.  -Plan on medical management.  Troponin peaked at 0.28.. -Added low dose beta blocker and statin. 2-D echo with EF of 55-60% with grade 1 diastolic dysfunction. Continue aspirin, Plavix.  Open abdominal Wound: recent  ulcerated gastric ulcer status post open omental patch on 05/11/2015 Patient complaining of abdominal pain.  She has open wound, mild drainage.  Surgery consulted.  Started on antibiotics for H pylori, which will cover for  infection.  Check KUB.    Essential hypertension Continue home dose hydralazine. Added low dose beta blocker     GERD with ulcerated gastric ulcer status post open omental patch on 05/11/2015  stable. on PPI twice a day. Monitor H&H Hb stable.  H. Pylori antibody positive, unclear if patient has received treatment in the past.   Start treatment for H. Pylori.    Severe protein-calorie malnutrition Added supplements.   Alzheimer's disease with severe dementia Continue Namenda  Leukocytosis UA with 3 to 6 WBC.  Mild drainage from abdominal wound. Surgery consulted. Started on antibiotics for H pylori  Dehydration;  foley place 9-11. IV fluids started. Needs to DC foley in 24 hours if urine out put increases. Follow B-Met.  Continue with IV fluds.   Right hip pain X-ray on admission unremarkable. Continue scheduled Tylenol alternating with when necessary tramadol.    Diet:cardiac  DVT prophylaxis: sq lovenox  Consult Cardiology   Code Status: DO NOT RESUSCITATE (after discussing in detail with patient's daughter [name] on the phone) Family Communication: discussed with patient.  Disposition Plan: Continue telemetry monitoring. Home in 1-2 days.   HPI/Subjective: She is complaining of abdominal pain. She is feeling better today than yesterday.   Objective: Filed Vitals:   06/06/15 1105  BP: 130/49  Pulse: 101  Temp:   Resp:     Intake/Output Summary (Last 24 hours) at 06/06/15 1400 Last data filed at 06/06/15 5537  Gross per 24 hour  Intake   1875 ml  Output    625 ml  Net   1250 ml   Filed Weights   06/02/15 2000  Weight: 45.2 kg (99 lb 10.4 oz)    Exam:   General:  Elderly demented female  not in distress  HEENT: No pallor, temporal wasting, moist mucosa  Cardiovascular: Normal S1 and S2, no murmurs rub or gallop  Respiratory: Clear bilaterally  Abdomen: Soft, nondistended, nontender, bowel sounds present  Musculoskeletal: Warm, no  edema, nontender  CNS: Demented   Data Reviewed: Basic Metabolic Panel:  Recent Labs Lab 06/02/15 1220 06/05/15 0448 06/06/15 0700  NA 146* 143 138  K 3.5 3.7 3.3*  CL 111 114* 110  CO2 23 20* 20*  GLUCOSE 101* 103* 105*  BUN 22* 21* 15  CREATININE 0.77 0.71 0.60  CALCIUM 8.3* 8.1* 7.9*   Liver Function Tests:  Recent Labs Lab 06/02/15 1220  AST 31  ALT 14  ALKPHOS 71  BILITOT 0.8  PROT 7.6  ALBUMIN 2.4*   No results for input(s): LIPASE, AMYLASE in the last 168 hours. No results for input(s): AMMONIA in the last 168 hours. CBC:  Recent Labs Lab 06/02/15 1220 06/03/15 0500 06/04/15 0535 06/05/15 0448 06/06/15 0700  WBC 13.6* 14.3* 13.3* 12.5* 13.2*  NEUTROABS 11.1*  --   --   --   --   HGB 9.8* 8.8* 7.9* 8.3* 8.0*  HCT 31.3* 29.0* 25.6* 26.9* 25.6*  MCV 96.0 101.0* 98.5 99.3 97.3  PLT 692* 637* 587* 524* 596*   Cardiac Enzymes:  Recent Labs Lab 06/02/15 1220 06/02/15 1950 06/03/15 0056 06/03/15 0716  TROPONINI 0.27* 0.28* 0.24* 0.24*   BNP (last 3 results)  Recent Labs  05/16/15 0705  BNP 3210.9*    ProBNP (last 3 results) No results for input(s): PROBNP in the last 8760 hours.  CBG: No results for input(s): GLUCAP in the last 168 hours.  Recent Results (from the past 240 hour(s))  Urine culture     Status: None (Preliminary result)   Collection Time: 06/05/15 10:53 AM  Result Value Ref Range Status   Specimen Description URINE, CATHETERIZED  Final   Special Requests NONE  Final   Culture   Final    NO GROWTH < 24 HOURS Performed at [facility]    Report Status PENDING  Incomplete     Studies: Dg Abd 1 View  06/06/2015   CLINICAL DATA:  Left-sided abdominal pain.  EXAM: ABDOMEN - 1 VIEW  COMPARISON:  05/18/2015  FINDINGS: Nonobstructive bowel gas pattern. No free air. No organomegaly. No visible suspicious calcification.  IMPRESSION: No evidence of bowel obstruction or free air.   Electronically Signed   By: Rolm Baptise M.D.   On: 06/06/2015 11:21    Scheduled Meds: . acetaminophen  500 mg Oral 3 times per day  . aspirin EC  81 mg Oral Daily  . atorvastatin  10 mg Oral q1800  . clarithromycin  500 mg Oral Q12H  . clopidogrel  75 mg Oral Q breakfast  . feeding supplement (ENSURE ENLIVE)  237 mL Oral BID BM  . gabapentin  100 mg Oral TID  . hydrALAZINE  50 mg Oral 3 times per day  . memantine  28 mg Oral Daily  . metoprolol succinate  25 mg Oral Daily  . metroNIDAZOLE  500 mg Oral Q12H  . pantoprazole  40 mg Oral BID AC  . polyethylene glycol  17 g Oral Daily   Continuous Infusions: . dextrose 5 % and 0.45% NaCl 75 mL/hr at 06/06/15 1242       Time spent: 25 minutes    Ravonda Brecheen A  [facility] Pager [phone] 7PM-7AM, please contact night-coverage at [email or web address], password [facility] 06/06/2015, 2:00 PM  LOS: 4 days

## 2015-06-07 ENCOUNTER — Encounter (HOSPITAL_COMMUNITY): Payer: Self-pay | Admitting: Student

## 2015-06-07 LAB — BASIC METABOLIC PANEL
ANION GAP: 7 (ref 5–15)
BUN: 10 mg/dL (ref 6–20)
CALCIUM: 8 mg/dL — AB (ref 8.9–10.3)
CHLORIDE: 109 mmol/L (ref 101–111)
CO2: 20 mmol/L — AB (ref 22–32)
Creatinine, Ser: 0.56 mg/dL (ref 0.44–1.00)
GFR calc non Af Amer: >60 mL/min (ref >60)
Glucose, Bld: 94 mg/dL (ref 65–99)
POTASSIUM: 4 mmol/L (ref 3.5–5.1)
Sodium: 136 mmol/L (ref 135–145)

## 2015-06-07 LAB — URINE CULTURE: CULTURE: NO GROWTH

## 2015-06-07 MED ORDER — TRAMADOL HCL 50 MG PO TABS
50.0000 mg | ORAL_TABLET | Freq: Four times a day (QID) | ORAL | Status: DC | PRN
  Administered 2015-06-07 – 2015-06-08 (×3): 50 mg via ORAL
  Filled 2015-06-07 (×3): qty 1

## 2015-06-07 MED ORDER — MINERAL OIL RE ENEM
1.0000 | ENEMA | Freq: Once | RECTAL | Status: AC
  Administered 2015-06-07: 1 via RECTAL
  Filled 2015-06-07: qty 1

## 2015-06-07 MED ORDER — METOPROLOL TARTRATE 25 MG PO TABS
12.5000 mg | ORAL_TABLET | Freq: Two times a day (BID) | ORAL | Status: DC
  Administered 2015-06-07 – 2015-06-08 (×3): 12.5 mg via ORAL
  Filled 2015-06-07 (×3): qty 1

## 2015-06-07 MED ORDER — PANTOPRAZOLE SODIUM 40 MG PO PACK
40.0000 mg | PACK | Freq: Two times a day (BID) | ORAL | Status: DC
  Administered 2015-06-07 – 2015-06-08 (×3): 40 mg via ORAL
  Filled 2015-06-07 (×4): qty 20

## 2015-06-07 NOTE — Clinical Social Work Note (Signed)
Clinical Social Work Assessment  Patient Details  Name: Elizabeth Peterson MRN: [id number] Date of Birth: 12/22/1918  Date of referral:  06/07/15               Reason for consult:  Discharge Planning                Permission sought to share information with:  Family Supports Permission granted to share information::  Yes, Verbal Permission Granted  Name::     Taiesha Bovard  Agency::     Relationship::  daughter  Contact Information:  [phone]  Housing/Transportation Living arrangements for the past 2 months:  Single Family Home Source of Information:  Adult Children Patient Interpreter Needed:  None Criminal Activity/Legal Involvement Pertinent to Current Situation/Hospitalization:  No - Comment as needed Significant Relationships:  Adult Children Lives with:  Adult Children Do you feel safe going back to the place where you live?  No Need for family participation in patient care:  Yes (Comment)  Care giving concerns: PT reports patient is requiring +2 assist and might require higher level of care at DC.    Social Worker assessment / plan:  CSW met with patient  at bedside. Patient oriented to person and pleasantly confused. Pt reports that she lives at home with family. CSW obtained permission to contact pt daughter, Elvin So. CSW spoke with patient daughter, Elvin So via telephone about DC plans and if SNF placement was desired. Patient daughter reports that pt family was caring for pt at home, but after pt recent surgery pt care has become more than pt family can manage at this time. Pt daughter hopeful for short term SNF placement and agreeable to [facility] list. CSW left SNF list at bedside as pt daughter reports that she will be at hospital later today.  CSW completed FL2 and initiated SNF search to [facility]. CSW faxed pt insurance authorization request to [facility]. CSW to follow up with pt family re: SNF bed offers.   CSW to continue to follow to  assist with pt disposition needs.   Employment status:  Retired Nurse, adult PT Recommendations:  [place] / Referral to community resources:  [place]  Patient/Family's Response to care:  Pt alert and oriented to person only. Pt pleasantly confused. Pt daughter hopeful for only short term rehab due to not feeling that pt family can provide the care that pt needs at this time.  Patient/Family's Understanding of and Emotional Response to Diagnosis, Current Treatment, and Prognosis:  Family reports they have been caring for patient for several years and that the family works well together, but pt needs have increased a lot since pt recent surgery.  Emotional Assessment Appearance:  Appears stated age Attitude/Demeanor/Rapport:  Other (pt appropriate; pleasantly confused) Affect (typically observed):  Appropriate (pleasantly confused) Orientation:  Oriented to Self Alcohol / Substance use:  Not Applicable Psych involvement (Current and /or in the community):  No (Comment)  Discharge Needs  Concerns to be addressed:  Discharge Planning Concerns Readmission within the last 30 days:  Yes Current discharge risk:  Physical Impairment Barriers to Discharge:      Ladell Pier, LCSW 06/07/2015, 11:09 AM  [phone]

## 2015-06-07 NOTE — Progress Notes (Signed)
Subjective: Her son is in the room and notes she complains of her legs hurting for years.  She is also complaining of her stomach, although the exam is normal.  Open site looks good too.  Her son says she has trouble eating foods, they give her mashed potatoes and soups at home.  Not eating here.  No Bm with the dulcolax suppository yesterday.    Objective: Vital signs in last 24 hours: Temp:  [97.8 F (36.6 C)-99.1 F (37.3 C)] 99.1 F (37.3 C) (09/14 0817) Pulse Rate:  [68-100] 100 (09/14 0817) Resp:  [16-20] 20 (09/14 0817) BP: (112-126)/(38-57) 118/45 mmHg (09/14 0817) SpO2:  [100 %] 100 % (09/14 0817) Last BM Date: 06/03/15 240 PO recorded, No BM recorded. TM 99.1, VSS BMP OK, no CBC this AM ABD film:  No evidence of bowel obstruction or free air. Heart healthy diet Intake/Output from previous day: 09/13 0701 - 09/14 0700 In: 1707.5 [P.O.:240; I.V.:1467.5] Out: 625 [Urine:625] Intake/Output this shift: Total I/O In: -  Out: 400 [Urine:400]  General appearance: awake complaining of pain GI: soft, she complains of pain but exam inconsistent with peritonitis. open wound is OK,  Lab Results:   Recent Labs  06/05/15 0448 06/06/15 0700  WBC 12.5* 13.2*  HGB 8.3* 8.0*  HCT 26.9* 25.6*  PLT 524* 596*    BMET  Recent Labs  06/06/15 0700 06/07/15 0616  NA 138 136  K 3.3* 4.0  CL 110 109  CO2 20* 20*  GLUCOSE 105* 94  BUN 15 10  CREATININE 0.60 0.56  CALCIUM 7.9* 8.0*   PT/INR No results for input(s): LABPROT, INR in the last 72 hours.   Recent Labs Lab 06/02/15 1220  AST 31  ALT 14  ALKPHOS 71  BILITOT 0.8  PROT 7.6  ALBUMIN 2.4*     Lipase     Component Value Date/Time   LIPASE 441* 05/11/2015 0305     Studies/Results: Dg Abd 1 View  06/06/2015   CLINICAL DATA:  Left-sided abdominal pain.  EXAM: ABDOMEN - 1 VIEW  COMPARISON:  05/18/2015  FINDINGS: Nonobstructive bowel gas pattern. No free air. No organomegaly. No visible suspicious  calcification.  IMPRESSION: No evidence of bowel obstruction or free air.   Electronically Signed   By: Rolm Baptise M.D.   On: 06/06/2015 11:21    Medications: . acetaminophen  500 mg Oral 3 times per day  . aspirin EC  81 mg Oral Daily  . atorvastatin  10 mg Oral q1800  . clarithromycin  500 mg Oral Q12H  . clopidogrel  75 mg Oral Q breakfast  . feeding supplement (ENSURE ENLIVE)  237 mL Oral BID BM  . gabapentin  100 mg Oral TID  . hydrALAZINE  50 mg Oral 3 times per day  . memantine  28 mg Oral Daily  . metoprolol tartrate  12.5 mg Oral BID  . metroNIDAZOLE  500 mg Oral Q12H  . pantoprazole sodium  40 mg Oral BID  . polyethylene glycol  17 g Oral Daily  . saccharomyces boulardii  250 mg Oral BID    Assessment/Plan Abdominal pain with hx of gastric perforated ulcer; s/p diagnostic laparoscopy converted to open repair gastric ulcer and omental patch, 05/11/15, Dr. Joyice Faster (hospitalized 8/18-8/29/16) H pylori Positive 05/16/15. S/p Non STEMI 06/02/15 GERD  Severe PCM Alzheimer's disease with dementia Constipation  Last BM 06/02/15 Antibiotics:  Day 4 Flagyl and clarithromycin DVT: Plavix/SCD   Plan:  I will change her to  a D2 diet, get speech to see and also ask dietician to see.  She is on  Miralax, I will try a Mineral oil enema and see if this speeds up process.  Last BM recorded 06/02/15.     LOS: 5 days    Macy Polio 06/07/2015

## 2015-06-07 NOTE — Progress Notes (Signed)
CSW continuing to follow.   CSW followed up with pt son at bedside and provided SNF bed offers. Pt son reports that pt family will review information and notify CSW of decision.  CSW received phone call from pt daughter, Elvin So who stated that pt family reviewed bed offers and choose [facility].  CSW received phone call from [facility] and pt authorized for SNF. [facility] Medicare Auth #: [id number] next review 9/18, RUQ level-RVB.)  CSW updated MD who stated pt not ready for d/c today.  CSW to continue to follow to provide support and assist with pt disposition needs.   Alison Murray, MSW, [facility] [phone]

## 2015-06-07 NOTE — Clinical Social Work Placement (Signed)
   CLINICAL SOCIAL WORK PLACEMENT  NOTE  Date:  06/07/2015  Patient Details  Name: Elizabeth Peterson MRN: [id number] Date of Birth: [birth year]  Clinical Social Work is seeking post-discharge placement for this patient at the [place] level of care (*CSW will initial, date and re-position this form in  chart as items are completed):  Yes   Patient/family provided with [facility] Work Department's list of facilities offering this level of care within the geographic area requested by the patient (or if unable, by the patient's family).  Yes   Patient/family informed of their freedom to choose among providers that offer the needed level of care, that participate in Medicare, Medicaid or managed care program needed by the patient, have an available bed and are willing to accept the patient.  Yes   Patient/family informed of [facility]'s ownership interest in [facility] and [facility], as well as of the fact that they are under no obligation to receive care at these facilities.  PASRR submitted to EDS on       PASRR number received on       Existing PASRR number confirmed on 06/07/15     FL2 transmitted to all facilities in geographic area requested by pt/family on 06/07/15     FL2 transmitted to all facilities within larger geographic area on       Patient informed that his/her managed care company has contracts with or will negotiate with certain facilities, including the following:        Yes   Patient/family informed of bed offers received.  Patient chooses bed at [place] recommends and patient chooses bed at      Patient to be transferred to   on  .  Patient to be transferred to facility by       Patient family notified on   of transfer.  Name of family member notified:        PHYSICIAN Please sign FL2, Please sign DNR     Additional Comment:     _______________________________________________ Ladell Pier, LCSW 06/07/2015, 5:08 PM

## 2015-06-07 NOTE — Progress Notes (Signed)
[facility] PROGRESS NOTE  Elizabeth Peterson SJG:[id number] DOB: 08-28-1919 DOA: 06/02/2015 PCP: Hollace Kinnier, DO  Brief narrative 81 year old female with severe Alzheimer's dementia with history of hypertension, hyperlipidemia, recent perforated gastric ulcer repaired with an omental patch on 05/11/2015, anxiety and depression, osteoarthritis who brought to the ED by family after patient complained of chest pain and right leg pain. Patient at baseline is wheelchair-bound and daughter denies any fall or trauma. As per family patient complaining of chest pain and abdominal pain 1 day prior to admission.  Course in the ED  Vitals  stable. Blood work done showed mild leukocytosis, hemoglobin of 9.8, platelets of 692. Chemistry showed sodium of 146, potassium 3.5, BUN of 22 0.77. Troponin was elevated 0.27. EKG showed normal sinus rhythm with ST depression in anterior leads and deep T-wave inversion in inferior and lateral leads concerning for NSTEMI. Head CT was unremarkable for acute abnormality. X-ray of the right hip was negative for any fracture. Chest x-ray was unremarkable.  Cardiology consulted who recommended medical management given her advanced age with severe dementia and poor functional status. Started on IV heparin after approved by surgery.   Assessment/Plan:  NSTEMI (non-ST elevated myocardial infarction) -Was Started on IV heparin drip after cleared by surgery (given recent perforated peptic ulcer with repair). Heparin drip discontinued 9-11.  -Plan on medical management.  Troponin peaked at 0.28.. -Added low dose beta blocker and statin. 2-D echo with EF of 55-60% with grade 1 diastolic dysfunction. Continue aspirin, Plavix.  Open abdominal Wound: recent  ulcerated gastric ulcer status post open omental patch on 05/11/2015 Patient complaining of abdominal pain.  She has open wound, mild drainage.  Surgery consulted.  Started on antibiotics for H pylori, which will cover for  infection.  KUB negative for obstruction.    Essential hypertension Continue home dose hydralazine. Added low dose beta blocker     GERD with ulcerated gastric ulcer status post open omental patch on 05/11/2015  stable. on PPI twice a day. Monitor H&H Hb stable.  H. Pylori antibody positive, unclear if patient has received treatment in the past.   Start treatment for H. Pylori.    Severe protein-calorie malnutrition Added supplements.   Alzheimer's disease with severe dementia Continue Namenda  Leukocytosis UA with 3 to 6 WBC.  Mild drainage from abdominal wound. Surgery consulted. Started on antibiotics for H pylori  Dehydration;  foley place 9-11. IV fluids started. Needs to DC foley in 24 hours if urine out put increases. Follow B-Met.  Continue with IV fluds.   Right hip pain X-ray on admission unremarkable. Continue scheduled Tylenol alternating with when necessary tramadol. Legs pain; chronic for months. continue with tramadol.    Diet:cardiac  DVT prophylaxis: sq lovenox  Consult Cardiology   Code Status: DO NOT RESUSCITATE (after discussing in detail with patient's daughter Elizabeth Peterson on the phone) Family Communication: discussed with patient.  Disposition Plan: needs SNF.    HPI/Subjective: Feeling ok, chronic LE pain.    Objective: Filed Vitals:   06/07/15 1330  BP: 118/59  Pulse: 94  Temp: 98.7 F (37.1 C)  Resp: 18    Intake/Output Summary (Last 24 hours) at 06/07/15 1552 Last data filed at 06/07/15 1331  Gross per 24 hour  Intake    830 ml  Output    850 ml  Net    -20 ml   Filed Weights   06/02/15 2000  Weight: 45.2 kg (99 lb 10.4 oz)    Exam:   General:  Elderly demented female not in distress  HEENT: No pallor, temporal wasting, moist mucosa  Cardiovascular: Normal S1 and S2, no murmurs rub or gallop  Respiratory: Clear bilaterally  Abdomen: Soft, nondistended, nontender, bowel sounds present  Musculoskeletal:  Warm, no edema, nontender  CNS: Demented   Data Reviewed: Basic Metabolic Panel:  Recent Labs Lab 06/02/15 1220 06/05/15 0448 06/06/15 0700 06/07/15 0616  NA 146* 143 138 136  K 3.5 3.7 3.3* 4.0  CL 111 114* 110 109  CO2 23 20* 20* 20*  GLUCOSE 101* 103* 105* 94  BUN 22* 21* 15 10  CREATININE 0.77 0.71 0.60 0.56  CALCIUM 8.3* 8.1* 7.9* 8.0*   Liver Function Tests:  Recent Labs Lab 06/02/15 1220  AST 31  ALT 14  ALKPHOS 71  BILITOT 0.8  PROT 7.6  ALBUMIN 2.4*   No results for input(s): LIPASE, AMYLASE in the last 168 hours. No results for input(s): AMMONIA in the last 168 hours. CBC:  Recent Labs Lab 06/02/15 1220 06/03/15 0500 06/04/15 0535 06/05/15 0448 06/06/15 0700  WBC 13.6* 14.3* 13.3* 12.5* 13.2*  NEUTROABS 11.1*  --   --   --   --   HGB 9.8* 8.8* 7.9* 8.3* 8.0*  HCT 31.3* 29.0* 25.6* 26.9* 25.6*  MCV 96.0 101.0* 98.5 99.3 97.3  PLT 692* 637* 587* 524* 596*   Cardiac Enzymes:  Recent Labs Lab 06/02/15 1220 06/02/15 1950 06/03/15 0056 06/03/15 0716  TROPONINI 0.27* 0.28* 0.24* 0.24*   BNP (last 3 results)  Recent Labs  05/16/15 0705  BNP 3210.9*    ProBNP (last 3 results) No results for input(s): PROBNP in the last 8760 hours.  CBG: No results for input(s): GLUCAP in the last 168 hours.  Recent Results (from the past 240 hour(s))  Urine culture     Status: None   Collection Time: 06/05/15 10:53 AM  Result Value Ref Range Status   Specimen Description URINE, CATHETERIZED  Final   Special Requests NONE  Final   Culture   Final    NO GROWTH 2 DAYS Performed at [facility]    Report Status 06/07/2015 FINAL  Final     Studies: Dg Abd 1 View  06/06/2015   CLINICAL DATA:  Left-sided abdominal pain.  EXAM: ABDOMEN - 1 VIEW  COMPARISON:  05/18/2015  FINDINGS: Nonobstructive bowel gas pattern. No free air. No organomegaly. No visible suspicious calcification.  IMPRESSION: No evidence of bowel obstruction or free air.    Electronically Signed   By: Rolm Baptise M.D.   On: 06/06/2015 11:21    Scheduled Meds: . acetaminophen  500 mg Oral 3 times per day  . aspirin EC  81 mg Oral Daily  . atorvastatin  10 mg Oral q1800  . clarithromycin  500 mg Oral Q12H  . clopidogrel  75 mg Oral Q breakfast  . feeding supplement (ENSURE ENLIVE)  237 mL Oral BID BM  . gabapentin  100 mg Oral TID  . hydrALAZINE  50 mg Oral 3 times per day  . memantine  28 mg Oral Daily  . metoprolol tartrate  12.5 mg Oral BID  . metroNIDAZOLE  500 mg Oral Q12H  . mineral oil  1 enema Rectal Once  . pantoprazole sodium  40 mg Oral BID  . polyethylene glycol  17 g Oral Daily  . saccharomyces boulardii  250 mg Oral BID   Continuous Infusions: . dextrose 5 % and 0.45% NaCl 50 mL/hr at 06/07/15 [phone]  Time spent: 25 minutes    [place] A  [facility] Pager [phone] 7PM-7AM, please contact night-coverage at [email or web address], password [facility] 06/07/2015, 3:52 PM  LOS: 5 days

## 2015-06-07 NOTE — Clinical Social Work Placement (Signed)
   CLINICAL SOCIAL WORK PLACEMENT  NOTE  Date:  06/07/2015  Patient Details  Name: Elizabeth Peterson MRN: [id number] Date of Birth: 06-16-1919  Clinical Social Work is seeking post-discharge placement for this patient at the [place] level of care (*CSW will initial, date and re-position this form in  chart as items are completed):  Yes   Patient/family provided with [place] [facility] Work Department's list of facilities offering this level of care within the geographic area requested by the patient (or if unable, by the patient's family).  Yes   Patient/family informed of their freedom to choose among providers that offer the needed level of care, that participate in Medicare, Medicaid or managed care program needed by the patient, have an available bed and are willing to accept the patient.  Yes   Patient/family informed of [facility]'s ownership interest in [facility] and [facility], as well as of the fact that they are under no obligation to receive care at these facilities.  PASRR submitted to EDS on       PASRR number received on       Existing PASRR number confirmed on 06/07/15     FL2 transmitted to all facilities in geographic area requested by pt/family on 06/07/15     FL2 transmitted to all facilities within larger geographic area on       Patient informed that his/her managed care company has contracts with or will negotiate with certain facilities, including the following:            Patient/family informed of bed offers received.  Patient chooses bed at       Physician recommends and patient chooses bed at      Patient to be transferred to   on  .  Patient to be transferred to facility by       Patient family notified on   of transfer.  Name of family member notified:        PHYSICIAN Please sign FL2, Please sign DNR     Additional Comment:    _______________________________________________ Ladell Pier,  LCSW 06/07/2015, 11:14 AM

## 2015-06-08 ENCOUNTER — Inpatient Hospital Stay (HOSPITAL_COMMUNITY): Payer: Medicare Other

## 2015-06-08 ENCOUNTER — Non-Acute Institutional Stay: Payer: Medicare Other | Admitting: Family Medicine

## 2015-06-08 ENCOUNTER — Telehealth: Payer: Self-pay | Admitting: Family Medicine

## 2015-06-08 DIAGNOSIS — I214 Non-ST elevation (NSTEMI) myocardial infarction: Secondary | ICD-10-CM

## 2015-06-08 DIAGNOSIS — G309 Alzheimer's disease, unspecified: Secondary | ICD-10-CM | POA: Diagnosis not present

## 2015-06-08 DIAGNOSIS — E43 Unspecified severe protein-calorie malnutrition: Secondary | ICD-10-CM

## 2015-06-08 DIAGNOSIS — F0280 Dementia in other diseases classified elsewhere without behavioral disturbance: Secondary | ICD-10-CM

## 2015-06-08 DIAGNOSIS — Z593 Problems related to living in residential institution: Secondary | ICD-10-CM | POA: Diagnosis not present

## 2015-06-08 DIAGNOSIS — D72829 Elevated white blood cell count, unspecified: Secondary | ICD-10-CM

## 2015-06-08 DIAGNOSIS — R634 Abnormal weight loss: Secondary | ICD-10-CM

## 2015-06-08 DIAGNOSIS — Z789 Other specified health status: Secondary | ICD-10-CM | POA: Insufficient documentation

## 2015-06-08 DIAGNOSIS — K259 Gastric ulcer, unspecified as acute or chronic, without hemorrhage or perforation: Secondary | ICD-10-CM

## 2015-06-08 DIAGNOSIS — M25562 Pain in left knee: Secondary | ICD-10-CM

## 2015-06-08 LAB — BASIC METABOLIC PANEL
Anion gap: 11 (ref 5–15)
BUN: 9 mg/dL (ref 6–20)
CALCIUM: 8.2 mg/dL — AB (ref 8.9–10.3)
CO2: 20 mmol/L — ABNORMAL LOW (ref 22–32)
CREATININE: 0.64 mg/dL (ref 0.44–1.00)
Chloride: 107 mmol/L (ref 101–111)
Glucose, Bld: 88 mg/dL (ref 65–99)
Potassium: 4 mmol/L (ref 3.5–5.1)
SODIUM: 138 mmol/L (ref 135–145)

## 2015-06-08 LAB — CBC
HCT: 26.4 % — ABNORMAL LOW (ref 36.0–46.0)
Hemoglobin: 8.3 g/dL — ABNORMAL LOW (ref 12.0–15.0)
MCH: 30.4 pg (ref 26.0–34.0)
MCHC: 31.4 g/dL (ref 30.0–36.0)
MCV: 96.7 fL (ref 78.0–100.0)
PLATELETS: 568 10*3/uL — AB (ref 150–400)
RBC: 2.73 MIL/uL — ABNORMAL LOW (ref 3.87–5.11)
RDW: 16.3 % — AB (ref 11.5–15.5)
WBC: 14.2 10*3/uL — ABNORMAL HIGH (ref 4.0–10.5)

## 2015-06-08 MED ORDER — ACETAMINOPHEN 500 MG PO TABS: 500.0000 mg | ORAL_TABLET | Freq: Three times a day (TID) | ORAL | Status: AC

## 2015-06-08 MED ORDER — CLOPIDOGREL BISULFATE 75 MG PO TABS: 75.0000 mg | ORAL_TABLET | Freq: Every day | ORAL | Status: AC

## 2015-06-08 MED ORDER — DOXYCYCLINE HYCLATE 50 MG PO CAPS: 100.0000 mg | ORAL_CAPSULE | Freq: Two times a day (BID) | ORAL | Status: AC

## 2015-06-08 MED ORDER — ASPIRIN 81 MG PO TBEC: 81.0000 mg | DELAYED_RELEASE_TABLET | Freq: Every day | ORAL | Status: AC

## 2015-06-08 MED ORDER — SACCHAROMYCES BOULARDII 250 MG PO CAPS: 250.0000 mg | ORAL_CAPSULE | Freq: Two times a day (BID) | ORAL | Status: AC

## 2015-06-08 MED ORDER — TRAMADOL HCL 50 MG PO TABS: 50.0000 mg | ORAL_TABLET | Freq: Four times a day (QID) | ORAL | Status: AC | PRN

## 2015-06-08 MED ORDER — ATORVASTATIN CALCIUM 10 MG PO TABS: 10.0000 mg | ORAL_TABLET | Freq: Every day | ORAL | Status: AC

## 2015-06-08 MED ORDER — METRONIDAZOLE 500 MG PO TABS: 500.0000 mg | ORAL_TABLET | Freq: Two times a day (BID) | ORAL | Status: AC

## 2015-06-08 MED ORDER — METOPROLOL TARTRATE 25 MG PO TABS: 12.5000 mg | ORAL_TABLET | Freq: Two times a day (BID) | ORAL | Status: AC

## 2015-06-08 MED ORDER — CLARITHROMYCIN 500 MG PO TABS: 500.0000 mg | ORAL_TABLET | Freq: Two times a day (BID) | ORAL | Status: AC

## 2015-06-08 MED ORDER — HYDRALAZINE HCL 50 MG PO TABS: 50.0000 mg | ORAL_TABLET | Freq: Three times a day (TID) | ORAL | Status: AC

## 2015-06-08 MED ORDER — PANTOPRAZOLE SODIUM 40 MG PO PACK: 40.0000 mg | PACK | Freq: Two times a day (BID) | ORAL | Status: AC

## 2015-06-08 MED ORDER — POLYETHYLENE GLYCOL 3350 17 G PO PACK: 17.0000 g | PACK | Freq: Every day | ORAL | Status: AC

## 2015-06-08 MED ORDER — ENSURE ENLIVE PO LIQD: 237.0000 mL | Freq: Two times a day (BID) | ORAL | Status: AC

## 2015-06-08 NOTE — Progress Notes (Signed)
NUTRITION NOTE  Pt seen for full assessment 9/12. New consult placed yesterday afternoon for assessment of nutrition status and requirements.   D/c summary in place; pt not seen today due to this. If pt unable to d/c today, RD to see pt 9/16 for follow-up and concerning consult.     Jarome Matin, RD, LDN Inpatient Clinical Dietitian Pager # [phone] After hours/weekend pager # [phone]

## 2015-06-08 NOTE — Progress Notes (Signed)
Subjective: Sleeping when I came in.  When I woke her up and ask how she was she said terrible.  When I ask what was wrong she complained of her legs.  No abdominal complaints.  Her open site looks fine.  She has a tray with large helping of scrambled eggs, grits, that has not been touched.  She has supplements at bedside that have been taken.  Still does not appear to have had a BM.  Objective: Vital signs in last 24 hours: Temp:  [98.1 F (36.7 C)-98.9 F (37.2 C)] 98.1 F (36.7 C) (09/15 0600) Pulse Rate:  [94-104] 100 (09/15 0600) Resp:  [18] 18 (09/15 0600) BP: (118-138)/(54-71) 121/54 mmHg (09/15 0600) SpO2:  [100 %] 100 % (09/15 0600) Last BM Date: 06/07/15  Intake/Output from previous day: 09/14 0701 - 09/15 0700 In: 1230 [P.O.:30; I.V.:1200] Out: 400 [Urine:400] Intake/Output this shift:    General appearance: alert, cooperative and no distress GI: soft, non-tender; bowel sounds normal; no masses,  no organomegaly and small open site looks fine.  Lab Results:   Recent Labs  06/06/15 0700 06/08/15 0441  WBC 13.2* 14.2*  HGB 8.0* 8.3*  HCT 25.6* 26.4*  PLT 596* 568*    BMET  Recent Labs  06/07/15 0616 06/08/15 0441  NA 136 138  K 4.0 4.0  CL 109 107  CO2 20* 20*  GLUCOSE 94 88  BUN 10 9  CREATININE 0.56 0.64  CALCIUM 8.0* 8.2*   PT/INR No results for input(s): LABPROT, INR in the last 72 hours.   Recent Labs Lab 06/02/15 1220  AST 31  ALT 14  ALKPHOS 71  BILITOT 0.8  PROT 7.6  ALBUMIN 2.4*     Lipase     Component Value Date/Time   LIPASE 441* 05/11/2015 0305     Studies/Results: Dg Abd 1 View  06/06/2015   CLINICAL DATA:  Left-sided abdominal pain.  EXAM: ABDOMEN - 1 VIEW  COMPARISON:  05/18/2015  FINDINGS: Nonobstructive bowel gas pattern. No free air. No organomegaly. No visible suspicious calcification.  IMPRESSION: No evidence of bowel obstruction or free air.   Electronically Signed   By: Rolm Baptise M.D.   On: 06/06/2015  11:21    Medications: . acetaminophen  500 mg Oral 3 times per day  . aspirin EC  81 mg Oral Daily  . atorvastatin  10 mg Oral q1800  . clarithromycin  500 mg Oral Q12H  . clopidogrel  75 mg Oral Q breakfast  . feeding supplement (ENSURE ENLIVE)  237 mL Oral BID BM  . gabapentin  100 mg Oral TID  . hydrALAZINE  50 mg Oral 3 times per day  . memantine  28 mg Oral Daily  . metoprolol tartrate  12.5 mg Oral BID  . metroNIDAZOLE  500 mg Oral Q12H  . pantoprazole sodium  40 mg Oral BID  . polyethylene glycol  17 g Oral Daily  . saccharomyces boulardii  250 mg Oral BID    Assessment/Plan Abdominal pain with hx of gastric perforated ulcer; s/p diagnostic laparoscopy converted to open repair gastric ulcer and omental patch, 05/11/15, Dr. Joyice Faster (hospitalized 8/18-8/29/16) H pylori Positive 05/16/15. S/p Non STEMI 06/02/15 GERD  Severe PCM Alzheimer's disease with dementia Constipation Last BM 06/02/15 Antibiotics: Day 4 Flagyl and clarithromycin DVT: Plavix/SCD  Plan:  From a surgical standpoint nothing further to add.  She can follow up with Dr. Marcello Moores as an outpatient as instructed in August.  Please call if we  can be of further assistance.    LOS: 6 days    Mike Berntsen 06/08/2015

## 2015-06-08 NOTE — Care Management Important Message (Signed)
Important Message  Patient Details  Name: Brandie Lopes MRN: [id number] Date of Birth: 1918/11/27   Medicare Important Message Given:  Yes-third notification given    Camillo Flaming 06/08/2015, 1:01 PMImportant Message  Patient Details  Name: Kalynn Declercq MRN: [id number] Date of Birth: 10/02/1918   Medicare Important Message Given:  Yes-third notification given    Camillo Flaming 06/08/2015, 1:01 PM

## 2015-06-08 NOTE — Discharge Summary (Addendum)
Physician Discharge Summary  Elizabeth Peterson QVZ:[id number] DOB: 1919-09-01 DOA: 06/02/2015  PCP: Hollace Kinnier, DO  Admit date: 06/02/2015 Discharge date: 06/08/2015  Time spent: 35 minutes  Recommendations for Outpatient Follow-up:  Needs cbc to follow WBC.  Needs bmet to follow renal function.  Follow up with surgery for further care of wound.   Discharge Diagnoses:    NSTEMI (non-ST elevated myocardial infarction   Essential hypertension   GERD   Severe protein-calorie malnutrition   Alzheimer's disease   Gastric ulcer with perforation s/p open omental patch 05/11/2015   Open abdominal wound.   Discharge Condition: stable.   Diet recommendation: heart healthy  Filed Weights   06/02/15 2000  Weight: 45.2 kg (99 lb 10.4 oz)    History of present illness:  81 year old female with severe Alzheimer's dementia with history of hypertension, hyperlipidemia, recent perforated gastric ulcer repaired with an omental patch on 05/11/2015, anxiety and depression, osteoarthritis who brought to the ED by family after patient complained of chest pain and right leg pain. Patient at baseline is wheelchair-bound and daughter denies any fall or trauma. As per family patient complaining of chest pain and abdominal pain yesterday...  No history of headache, fevers, chills, nausea, vomiting, shortness of breath, dysuria or diarrhea. As per family patient has been constipated.  Course in the ED  Vitals were stable. Blood will done showed mild leukocytosis, hemoglobin of 9.8, platelets of 692. Chemistry showed sodium of 146, potassium 3.5, BN of 22 0.77. Troponin was elevated 0.27. EKG showed normal sinus rhythm with ST depression in anterior leads and deep T-wave inversion in inferior and lateral leads concerning for NSTEMI.  Head CT was unremarkable for acute abnormality. X-ray of the right hip was negative for any fracture. Chest x-ray was unremarkable.  Cardiology consulted who recommended medical  management given her advanced age and dementia with poor functional status. Recommended starting on IV heparin if approved by surgery. ED physician spoke with general surgery who approved of placing patient on IV heparin. Patient started on IV heparin and hospitalist admission requested to telemetry.  Hospital Course:  81 year old female with severe Alzheimer's dementia with history of hypertension, hyperlipidemia, recent perforated gastric ulcer repaired with an omental patch on 05/11/2015, anxiety and depression, osteoarthritis who brought to the ED by family after patient complained of chest pain and right leg pain. Patient at baseline is wheelchair-bound and daughter denies any fall or trauma. As per family patient complaining of chest pain and abdominal pain 1 day prior to admission.  Course in the ED  Vitals stable. Blood work done showed mild leukocytosis, hemoglobin of 9.8, platelets of 692. Chemistry showed sodium of 146, potassium 3.5, BUN of 22 0.77. Troponin was elevated 0.27. EKG showed normal sinus rhythm with ST depression in anterior leads and deep T-wave inversion in inferior and lateral leads concerning for NSTEMI. Head CT was unremarkable for acute abnormality. X-ray of the right hip was negative for any fracture. Chest x-ray was unremarkable.  Cardiology consulted who recommended medical management given her advanced age with severe dementia and poor functional status. Started on IV heparin after approved by surgery.   Assessment/Plan:  NSTEMI (non-ST elevated myocardial infarction) -Was Started on IV heparin drip after cleared by surgery (given recent perforated peptic ulcer with repair). Heparin drip discontinued 9-11.  -Plan on medical management.  Troponin peaked at 0.28.. -Added low dose beta blocker and statin. 2-D echo with EF of 55-60% with grade 1 diastolic dysfunction. Continue aspirin, Plavix.  Open abdominal  Wound: recent ulcerated gastric ulcer status post  open omental patch on 05/11/2015 Patient complaining of abdominal pain.  She has open wound, mild drainage.  Surgery consulted. recommending dressing changes.  Started on antibiotics for H pylori, which will cover for infection.  KUB negative for obstruction. Had BM 9-14.   Essential hypertension Continue home dose hydralazine. Added low dose beta blocker    GERD with ulcerated gastric ulcer status post open omental patch on 05/11/2015 stable. on PPI twice a day. Monitor H&H Hb stable.  H. Pylori antibody positive, unclear if patient has received treatment in the past.  Start treatment for H. Pylori. Flagyl; and clarithromycin.    Severe protein-calorie malnutrition Added supplements.   Alzheimer's disease with severe dementia Continue Namenda  Leukocytosis UA with 3 to 6 WBC.  Mild drainage from abdominal wound. Surgery consulted. Started on antibiotics for H pylori.  WBC 14---13. Afebrile. Urine culture no growth. Will add doxy to cover for wound infection.   Dehydration;  foley place 9-11. IV fluids started. Needs to DC foley in 24 hours if urine out put increases. Follow B-Met.  Continue with IV fluds.   Right hip pain X-ray on admission unremarkable. Continue scheduled Tylenol alternating with when necessary tramadol. Legs pain; chronic for months. continue with tramadol.   Procedures:  none  Consultations:  Surgery   Discharge Exam: Filed Vitals:   06/08/15 1025  BP: 106/43  Pulse: 97  Temp:   Resp:     General: Alert in no distress Cardiovascular: S 1, S 2 RRR Respiratory: CTA Abdomen wound with dressing  Discharge Instructions   Discharge Instructions    Diet - low sodium heart healthy    Complete by:  As directed      Increase activity slowly    Complete by:  As directed           Current Discharge Medication List    START taking these medications   Details  acetaminophen (TYLENOL) 500 MG tablet Take 1 tablet (500 mg total)  by mouth every 8 (eight) hours. Qty: 30 tablet, Refills: 0    aspirin EC 81 MG EC tablet Take 1 tablet (81 mg total) by mouth daily. Qty: 30 tablet, Refills: 0    atorvastatin (LIPITOR) 10 MG tablet Take 1 tablet (10 mg total) by mouth daily at 6 PM. Qty: 30 tablet, Refills: 0    clarithromycin (BIAXIN) 500 MG tablet Take 1 tablet (500 mg total) by mouth every 12 (twelve) hours. Qty: 20 tablet, Refills: 0    clopidogrel (PLAVIX) 75 MG tablet Take 1 tablet (75 mg total) by mouth daily with breakfast. Qty: 30 tablet, Refills: 0    doxycycline (VIBRAMYCIN) 50 MG capsule Take 2 capsules (100 mg total) by mouth 2 (two) times daily. Qty: 20 capsule, Refills: 0    feeding supplement, ENSURE ENLIVE, (ENSURE ENLIVE) LIQD Take 237 mLs by mouth 2 (two) times daily between meals. Qty: 237 mL, Refills: 12    metoprolol tartrate (LOPRESSOR) 25 MG tablet Take 0.5 tablets (12.5 mg total) by mouth 2 (two) times daily. Qty: 30 tablet, Refills: 0    metroNIDAZOLE (FLAGYL) 500 MG tablet Take 1 tablet (500 mg total) by mouth every 12 (twelve) hours. Qty: 20 tablet, Refills: 0    pantoprazole sodium (PROTONIX) 40 mg/20 mL PACK Take 20 mLs (40 mg total) by mouth 2 (two) times daily. Qty: 60 each, Refills: 0    polyethylene glycol (MIRALAX / GLYCOLAX) packet Take 17 g by mouth  daily. Qty: 14 each, Refills: 0    saccharomyces boulardii (FLORASTOR) 250 MG capsule Take 1 capsule (250 mg total) by mouth 2 (two) times daily. Qty: 30 capsule, Refills: 0      CONTINUE these medications which have CHANGED   Details  hydrALAZINE (APRESOLINE) 50 MG tablet Take 1 tablet (50 mg total) by mouth every 8 (eight) hours. Qty: 30 tablet, Refills: 0    traMADol (ULTRAM) 50 MG tablet Take 1 tablet (50 mg total) by mouth every 6 (six) hours as needed for moderate pain or severe pain. Qty: 30 tablet, Refills: 0      CONTINUE these medications which have NOT CHANGED   Details  AMBULATORY NON FORMULARY MEDICATION  Wheelchair and Cushion Qty: 1 each, Refills: 0    cetirizine (ZYRTEC) 10 MG tablet Take 1 tablet (10 mg total) by mouth daily. Qty: 30 tablet, Refills: 3   Associated Diagnoses: Atopic dermatitis; Cough    gabapentin (NEURONTIN) 100 MG capsule Take 1 capsule (100 mg total) by mouth 3 (three) times daily. Qty: 90 capsule, Refills: 5   Associated Diagnoses: Peripheral neuropathy    Memantine HCl ER 28 MG CP24 Take 28 mg by mouth daily. Qty: 90 capsule, Refills: 3   Associated Diagnoses: Alzheimer's disease      STOP taking these medications     acetaminophen (TYLENOL) 650 MG CR tablet      pantoprazole (PROTONIX) 40 MG tablet      levofloxacin (LEVAQUIN) 750 MG tablet      Tdap (BOOSTRIX) 5-2.5-18.5 LF-MCG/0.5 injection        Allergies  Allergen Reactions  . Nsaids Other (See Comments)    Perforated gastric ulcer  . Amlodipine Besylate     REACTION: rash and itch  . Benzodiazepines Other (See Comments)    Hx of overuse/memory issue  . Penicillins   . Sulfonamide Derivatives    Follow-up Information    Follow up with REED, TIFFANY, DO In 1 week.   Specialty:  Geriatric Medicine   Contact information:   [address] [place] [address] [phone]        The results of significant diagnostics from this hospitalization (including imaging, microbiology, ancillary and laboratory) are listed below for reference.    Significant Diagnostic Studies: Ct Abdomen Pelvis Wo Contrast  05/18/2015   CLINICAL DATA:  Leukocytosis. Laparoscopic gastric ulcer surgery 05/11/2015 with persistent abdominal pain.  EXAM: CT ABDOMEN AND PELVIS WITHOUT CONTRAST  TECHNIQUE: Multidetector CT imaging of the abdomen and pelvis was performed following the standard protocol without IV contrast.  COMPARISON:  05/11/2015  FINDINGS: Lung bases demonstrate worsening moderate size right effusion and small left effusion with associated compressive atelectasis in the right base. Mild stable  cardiomegaly with atherosclerotic coronary artery disease unchanged. Central venous line seen with tip over the cavoatrial junction.  Abdominal images demonstrate the stomach to be mildly distended with air and minimal wall thickening over the distal stomach/ proximal duodenum likely postsurgical. This mild wall thickening/ edema could be causing a degree of obstruction. No evidence of free peritoneal air. Small amount of perihepatic fluid. Minimal density just below the splenic flexure likely postsurgical.  There are a couple sub cm stable liver hypodensities likely cysts. Suggestion mild contrast excretion within the gallbladder. The spleen, pancreas and adrenal glands are within normal. Kidneys normal in size without nephrolithiasis. There is subtle symmetric prominence of the intrarenal collecting systems. Appendix not seen.  Contrast and air are present throughout the colon. There is  mild diverticulosis over the sigmoid colon. There are several minimally dilated small bowel loops likely postoperative ileus.  Abdominal wall incision anteriorly in the midline. Bilateral subcutaneous edema over the abdominal wall and pelvic soft tissues.  Calcified plaque of the abdominal aorta and iliac arteries.  Pelvic images demonstrate a Foley catheter present within a decompressed bladder. There is mild fecal retention over the rectum. There is a small amount of free pelvic fluid present.  There are degenerative changes of the spine with multilevel disc disease over the lumbar spine and subtle grade 1 anterolisthesis of L3 on L4 and L4 on L5 unchanged. Mild degenerate change of the hips.  IMPRESSION: Mild gaseous distention of the stomach with minimal wall thickening over the distal stomach/ proximal duodenum likely postsurgical edema and may be causing a mild degree of obstruction. Several mildly dilated small bowel loops likely postoperative ileus. Small amount of peritoneal fluid likely postsurgical. No free peritoneal  air.  Worsening moderate size right effusion and small left effusion with associated compressive atelectasis in the right base.  Mild diverticulosis of the sigmoid colon.  Two small subcentimeter liver hypodensities unchanged likely cysts.  Stable cardiomegaly and atherosclerotic coronary artery disease.  Other stable chronic finding as described.   Electronically Signed   By: Marin Olp M.D.   On: 05/18/2015 14:25   Ct Abdomen Pelvis Wo Contrast  05/11/2015   CLINICAL DATA:  Generalized abdominal pain, worse last night.  EXAM: CT ABDOMEN AND PELVIS WITHOUT CONTRAST  TECHNIQUE: Multidetector CT imaging of the abdomen and pelvis was performed following the standard protocol without IV contrast.  COMPARISON:  None.  FINDINGS: BODY WALL: No contributory findings.  LOWER CHEST: Small bilateral pleural effusion with right basilar atelectasis. Small lower pneumomediastinum through hiatal hernia.  ABDOMEN/PELVIS:  Liver: Few sub cm low densities favor cysts.  Biliary: Haziness of fat around the gallbladder is likely secondary. No primary inflammation suspected.  Pancreas: Unremarkable.  Spleen: Unremarkable.  Adrenals: Unremarkable.  Kidneys and ureters: Mild bilateral renal cortical atrophy. No hydronephrosis.  Bladder: Distended without wall thickening.  Reproductive: Hysterectomy.  No adnexal mass.  Bowel: There is a large volume of pneumoperitoneum distributed throughout the abdomen. Given the large volume, distribution is not helpful in identifying a source. Small volume ascites accompanies the fluid, with probable peritoneal thickening in the pelvic recesses. The source of perforation is uncertain; there is no definitive pneumatosis or ulceration. No inflammatory wall thickening around the gastric antrum or duodenum. The cecum is gas filled and ventrally rotated, but there is no bascule or volvulus and no associated pneumatosis. Oral contrast reaches the third portion duodenum and there is no contrast  extravasation.  Vascular: No acute abnormality.  OSSEOUS: No acute abnormalities.  These results were called by telephone at the time of interpretation on 05/11/2015 at 5:02 am to Dr. Julianne Rice , who verbally acknowledged these results.  IMPRESSION: 1. Perforated bowel with large pneumoperitoneum and small ascites. The site of perforation is uncertain; no extravasation of oral contrast which reaches the third portion duodenum. Colonic diverticulosis is present. 2. Atelectasis and small bilateral pleural effusion.   Electronically Signed   By: Monte Fantasia M.D.   On: 05/11/2015 05:07   Dg Chest 2 View  06/02/2015   CLINICAL DATA:  Altered mental status. History of breast carcinoma. Hypertension.  EXAM: CHEST  2 VIEW  COMPARISON:  May 19, 2015  FINDINGS: There is no edema or consolidation. Heart size and pulmonary vascularity are normal. No adenopathy. There  is atherosclerotic calcification in the aorta. There is a surgical clip in the left hemi thorax region. No bone lesions are appreciable. No pneumothorax.  IMPRESSION: No edema or consolidation.   Electronically Signed   By: Lowella Grip III M.D.   On: 06/02/2015 13:19   Dg Chest 2 View  05/18/2015   CLINICAL DATA:  Leukocytosis. No chest complaints. History of hypertension, asthma, dementia.  EXAM: CHEST  2 VIEW  COMPARISON:  05/16/2015 and earlier  FINDINGS: Right-sided PICC line tip overlies the level of the right atrium. Right scratched of bilateral pleural effusions are present, right greater than left. Bilateral lung opacities obscure the hemidiaphragms bilaterally, right greater than left. Little change since prior study. Residual contrast identified in the colon. Cardiomegaly.  IMPRESSION: 1. Persistent bilateral infiltrates, right greater than left. 2. Bilateral pleural effusions.   Electronically Signed   By: Nolon Nations M.D.   On: 05/18/2015 13:47   Dg Abd 1 View  06/06/2015   CLINICAL DATA:  Left-sided abdominal pain.  EXAM:  ABDOMEN - 1 VIEW  COMPARISON:  05/18/2015  FINDINGS: Nonobstructive bowel gas pattern. No free air. No organomegaly. No visible suspicious calcification.  IMPRESSION: No evidence of bowel obstruction or free air.   Electronically Signed   By: Rolm Baptise M.D.   On: 06/06/2015 11:21   Ct Head Wo Contrast  06/02/2015   CLINICAL DATA:  Confusion.  History of dementia  EXAM: CT HEAD WITHOUT CONTRAST  TECHNIQUE: Contiguous axial images were obtained from the base of the skull through the vertex without intravenous contrast.  COMPARISON:  October 01, 2011  FINDINGS: There is persistent frontal and temporal lobe atrophy bilaterally. The parietal and occipital lobes do not show appreciable atrophy. Ventricles are enlarged in a generalized manner, stable. There is no intracranial mass, hemorrhage, extra-axial fluid collection, or midline shift. There is patchy small vessel disease in the centra semiovale, likely due to small vessel vascular disease as well as a potential degree of interstitial edema given the degree of ventricular enlargement present. No new gray-white compartment lesions. No acute infarct evident. Bony calvarium appears intact. The mastoid air cells are clear.  IMPRESSION: Stable appearance compared to the 2013 examination. Evidence of frontal and temporal lobar degeneration. Given the overall appearance of the ventricles, there may be a degree of underlying communicating hydrocephalus. There is probable periventricular small vessel disease, likely with superimposed interstitial edema due to the enlarged ventricles. No acute infarct evident. No hemorrhage or mass effect.   Electronically Signed   By: Lowella Grip III M.D.   On: 06/02/2015 15:27   Dg Chest Port 1 View  05/19/2015   CLINICAL DATA:  81 year old female with right pleural effusion -status post thoracentesis.  EXAM: PORTABLE CHEST - 1 VIEW  COMPARISON:  05/19/2015 and prior exams  FINDINGS: Right lower lung atelectasis and probable  right pleural effusion noted.  There is no evidence of pneumothorax.  Cardiomegaly and right PICC line with tip overlying the superior cavoatrial junction again noted.  Mild left basilar atelectasis is noted.  IMPRESSION: Little significant change in appearance of the chest. No evidence of pneumothorax identified on the study.  Bilateral lower lung atelectasis, right greater than left, and probable right pleural effusion again noted.   Electronically Signed   By: Margarette Canada M.D.   On: 05/19/2015 15:31   Dg Chest Port 1 View  05/19/2015   CLINICAL DATA:  Status post thoracentesis right side. History of asthma, hypertension, pneumonia, left mastectomy.  EXAM: PORTABLE CHEST - 1 VIEW  COMPARISON:  05/18/2015  FINDINGS: Right-sided PICC line tip overlies the level of the lower superior vena cava, unchanged. There continues be dense opacification at the right lung base obscuring the hemidiaphragm. Horizontal level of the pleural effusion raises the question of air within the pleural space although no definite pneumothorax can be identified. There is persistent opacity in the left lung base as well. EKG lead overlies the left lung apex. Residual contrast is seen the colon.  IMPRESSION: 1. Status post right pneumothorax scratched a status post right thoracentesis. 2. Question of small right pneumothorax.  Follow-up is recommended. The salient findings were discussed with Noe Gens on 05/19/2015 at 1:41 pm.   Electronically Signed   By: Nolon Nations M.D.   On: 05/19/2015 13:41   Dg Chest Port 1 View  05/16/2015   CLINICAL DATA:  Increasing short of breath  EXAM: PORTABLE CHEST - 1 VIEW  COMPARISON:  05/14/2015  FINDINGS: The heart is moderately enlarged. Pulmonary edema has developed. Opacity at the right base is stable likely a combination of volume loss, consolidation, and pleural fluid. Atelectasis at the left base is stable. Tiny left pleural effusion is not excluded.  IMPRESSION: New onset of pulmonary  edema. Bibasilar atelectasis and right pleural effusion are stable.   Electronically Signed   By: Marybelle Killings M.D.   On: 05/16/2015 08:19   Dg Chest Port 1 View  05/14/2015   CLINICAL DATA:  Shortness of breath  EXAM: PORTABLE CHEST - 1 VIEW  COMPARISON:  05/12/2015  FINDINGS: Nasogastric tube tip has been removed. Dilated colon again seen in the left abdomen.  Bilateral basilar opacification, greater on the right. Appearance suggests associated pleural fluid. Unchanged cardiopericardial enlargement. Stable upper mediastinal contours.  IMPRESSION: Stable bibasilar lung opacity, greater on the right. Admission abdominal CT favors pleural fluid and atelectasis.   Electronically Signed   By: Monte Fantasia M.D.   On: 05/14/2015 06:14   Dg Chest Port 1 View  05/12/2015   CLINICAL DATA:  Respiratory failure.  EXAM: PORTABLE CHEST - 1 VIEW  COMPARISON:  05/11/2015.  FINDINGS: NG tube noted coiled in stomach. Mediastinum hilar structures are normal. Heart size stable. Right lower lobe infiltrate consistent pneumonia. Right pleural effusion. Subsegmental atelectasis left lung base. No pneumothorax. Surgical clip left upper abdomen.  IMPRESSION: 1.  NG tube noted coiled in stomach.  2. Right lower lobe infiltrate consistent pneumonia. Moderate right pleural effusion.   Electronically Signed   By: Marcello Moores  Register   On: 05/12/2015 07:10   Dg Chest Port 1 View  05/11/2015   CLINICAL DATA:  Hypoxia  EXAM: PORTABLE CHEST - 1 VIEW  COMPARISON:  02/28/2013  FINDINGS: Normal heart size and aortic contours.  Atelectasis in the right upper lobe seen on the previous study has resolved. There is an indistinct opacity at the right base obscuring the diaphragm which is elevated compared to prior. No edema, effusion, or air leak.  IMPRESSION: Hazy opacity at the right base favoring atelectasis. This area should be seen on abdominal CT scheduled for later today.   Electronically Signed   By: Monte Fantasia M.D.   On: 05/11/2015  03:41   Dg Duanne Limerick W/water Sol Cm  05/16/2015   CLINICAL DATA:  Patient status post repair per stroke perforated gastric ulcer with omental patch.  EXAM: WATER SOLUBLE UPPER GI SERIES  TECHNIQUE: Single-column upper GI series was performed using water soluble contrast.  CONTRAST:  10mL OMNIPAQUE  IOHEXOL 300 MG/ML  SOLN  COMPARISON:  None.  FLUOROSCOPY TIME:  Radiation Exposure Index (as provided by the fluoroscopic device):  If the device does not provide the exposure index:  Fluoroscopy Time (in minutes and seconds):  1 minutes 52 seconds  Number of Acquired Images:  8  FINDINGS: Exam is suboptimal due to limited intake of the oral contrast as well as limited patient mobility. The contrast does flow easily into the stomach and ultimately passes into the first and second second portion of the duodenum. Stomach is gas-filled. No evidence of leak of the oral contrast.  IMPRESSION: No evidence of leak of oral contrast from the stomach on limited exam.   Electronically Signed   By: Suzy Bouchard M.D.   On: 05/16/2015 15:04   Dg Hip Unilat With Pelvis 2-3 Views Right  06/02/2015   CLINICAL DATA:  Pain.  No known trauma.  EXAM: DG HIP (WITH OR WITHOUT PELVIS) 2-3V RIGHT  COMPARISON:  None.  FINDINGS: Frontal pelvis as well as frontal and lateral right hip images obtained. There is moderate narrowing of both hip joints. No acute fracture or dislocation. No erosive change.  IMPRESSION: Narrowing both hip joints.  No fracture or dislocation.   Electronically Signed   By: Lowella Grip III M.D.   On: 06/02/2015 13:18    Microbiology: Recent Results (from the past 240 hour(s))  Urine culture     Status: None   Collection Time: 06/05/15 10:53 AM  Result Value Ref Range Status   Specimen Description URINE, CATHETERIZED  Final   Special Requests NONE  Final   Culture   Final    NO GROWTH 2 DAYS Performed at [facility]    Report Status 06/07/2015 FINAL  Final     Labs: Basic Metabolic  Panel:  Recent Labs Lab 06/02/15 1220 06/05/15 0448 06/06/15 0700 06/07/15 0616 06/08/15 0441  NA 146* 143 138 136 138  K 3.5 3.7 3.3* 4.0 4.0  CL 111 114* 110 109 107  CO2 23 20* 20* 20* 20*  GLUCOSE 101* 103* 105* 94 88  BUN 22* 21* $Remov'15 10 9  'kzvHlS$ CREATININE 0.77 0.71 0.60 0.56 0.64  CALCIUM 8.3* 8.1* 7.9* 8.0* 8.2*   Liver Function Tests:  Recent Labs Lab 06/02/15 1220  AST 31  ALT 14  ALKPHOS 71  BILITOT 0.8  PROT 7.6  ALBUMIN 2.4*   No results for input(s): LIPASE, AMYLASE in the last 168 hours. No results for input(s): AMMONIA in the last 168 hours. CBC:  Recent Labs Lab 06/02/15 1220 06/03/15 0500 06/04/15 0535 06/05/15 0448 06/06/15 0700 06/08/15 0441  WBC 13.6* 14.3* 13.3* 12.5* 13.2* 14.2*  NEUTROABS 11.1*  --   --   --   --   --   HGB 9.8* 8.8* 7.9* 8.3* 8.0* 8.3*  HCT 31.3* 29.0* 25.6* 26.9* 25.6* 26.4*  MCV 96.0 101.0* 98.5 99.3 97.3 96.7  PLT 692* 637* 587* 524* 596* 568*   Cardiac Enzymes:  Recent Labs Lab 06/02/15 1220 06/02/15 1950 06/03/15 0056 06/03/15 0716  TROPONINI 0.27* 0.28* 0.24* 0.24*   BNP: BNP (last 3 results)  Recent Labs  05/16/15 0705  BNP 3210.9*    ProBNP (last 3 results) No results for input(s): PROBNP in the last 8760 hours.  CBG: No results for input(s): GLUCAP in the last 168 hours.     SignedNiel Hummer A  [facility] 06/08/2015, 11:17 AM

## 2015-06-08 NOTE — Telephone Encounter (Signed)
Received call from nurse regarding new [place] admission from [facility] Room 212. Medications confirmed. Patient not yet at facility. Requested admission vitals as soon as patient arrives.  Cordelia Poche, MD PGY-3, [facility] 06/08/2015, 2:49 PM

## 2015-06-08 NOTE — Clinical Social Work Placement (Signed)
   CLINICAL SOCIAL WORK PLACEMENT  NOTE  Date:  06/08/2015  Patient Details  Name: Elizabeth Peterson MRN: [id number] Date of Birth: March 19, 1919  Clinical Social Work is seeking post-discharge placement for this patient at the [place] level of care (*CSW will initial, date and re-position this form in  chart as items are completed):  Yes   Patient/family provided with [place] Work Department's list of facilities offering this level of care within the geographic area requested by the patient (or if unable, by the patient's family).  Yes   Patient/family informed of their freedom to choose among providers that offer the needed level of care, that participate in Medicare, Medicaid or managed care program needed by the patient, have an available bed and are willing to accept the patient.  Yes   Patient/family informed of 's ownership interest in [facility] and [facility], as well as of the fact that they are under no obligation to receive care at these facilities.  PASRR submitted to EDS on       PASRR number received on       Existing PASRR number confirmed on 06/07/15     FL2 transmitted to all facilities in geographic area requested by pt/family on 06/07/15     FL2 transmitted to all facilities within larger geographic area on       Patient informed that his/her managed care company has contracts with or will negotiate with certain facilities, including the following:        Yes   Patient/family informed of bed offers received.  Patient chooses bed at [facility] recommends and patient chooses bed at      Patient to be transferred to [facility] on 06/08/15.  Patient to be transferred to facility by ambulance Corey Harold)     Patient family notified on 06/08/15 of transfer.  Name of family member notified:  pt son, Lavone Neri notified via telephone     PHYSICIAN Please sign FL2, Please  sign DNR     Additional Comment:    _______________________________________________ Ladell Pier, LCSW 06/08/2015, 2:55 PM

## 2015-06-08 NOTE — Progress Notes (Signed)
Pt being d/c'd to SNF, attempted to call report, was transferred to four different people and was disconnected when a fifth transfer was attempted, IV and telemetry removed, transport via stretcher per transport services

## 2015-06-08 NOTE — Assessment & Plan Note (Addendum)
Patient on Namenda. MMSE of 6. Patient qualifies for hospice as she is Stage 7C with comorbid 10% weight loss/albumin <2.5gm/dl. Spoke with daughter regarding patient's course and it appears she has continued to decline, even with Namenda. Will trial off of Namenda. If cognition significantly worsens, can restart

## 2015-06-08 NOTE — Progress Notes (Signed)
[place]  Visit  Primary Care Provider: Hollace Kinnier, DO Location of Care: [facility] Visit Information: Initial Encounter H&P Patient accompanied by patient and Self only Source(s) of information for visit: patient and daughter  Chief Complaint:  Chief Complaint  Patient presents with  . New Admit To SNF   HPI: Patient was admitted to the hospital ACS workup after complaining of chest pain. She was found to have an NSTEMI. She was also complaining of leg pain at that time. About one month ago, patient had underwent ex lap for gastric ulcer repair. Medicine coordinated with Cardiology and Surgery regarding patient's care. Patient started on heparin during workup. Patient received medical management of NSTEMI and was started on ASA, Plavix and Metoprolol as new medications. She was found to have an H. Pylori infection and was started treatment with clarithromycin, metronidazole. She was started on doxycycline for wound infection.  Nursing Concerns: None  Nutrition Concerns: None available yet  Wound Care Nurse Concerns: TBD  PT Concerns and Goals: Concerns TBD  OT Concerns and Goals: TBD   If SNF admission, patient's goal for the rehabilitation admission:  Goals identified by the patient's daughter:;  increase overall strength and endurance    Family Goals: for patient to be out of subacute care needs   If SNF admission, discharge disposition goals:  remain at home   HISTORY OF PRESENT ILLNESS: Facility-Administered Encounter Medications as of 06/08/2015  Medication  . acetaminophen (TYLENOL) tablet 500 mg  . acetaminophen (TYLENOL) tablet 650 mg  . aspirin EC tablet 81 mg  . atorvastatin (LIPITOR) tablet 10 mg  . clarithromycin (BIAXIN) tablet 500 mg  . clopidogrel (PLAVIX) tablet 75 mg  . dextrose 5 %-0.45 % sodium chloride infusion  . feeding supplement (ENSURE ENLIVE) (ENSURE ENLIVE) liquid 237 mL  . gabapentin (NEURONTIN) capsule 100 mg    . hydrALAZINE (APRESOLINE) tablet 50 mg  . memantine (NAMENDA XR) 24 hr capsule 28 mg  . metoprolol tartrate (LOPRESSOR) tablet 12.5 mg  . metroNIDAZOLE (FLAGYL) tablet 500 mg  . nitroGLYCERIN (NITROSTAT) SL tablet 0.4 mg  . ondansetron (ZOFRAN) injection 4 mg  . pantoprazole sodium (PROTONIX) 40 mg/20 mL oral suspension 40 mg  . polyethylene glycol (MIRALAX / GLYCOLAX) packet 17 g  . saccharomyces boulardii (FLORASTOR) capsule 250 mg  . traMADol (ULTRAM) tablet 50 mg   Outpatient Encounter Prescriptions as of 06/08/2015  Medication Sig  . acetaminophen (TYLENOL) 500 MG tablet Take 1 tablet (500 mg total) by mouth every 8 (eight) hours.  Marland Kitchen acetaminophen (TYLENOL) 650 MG CR tablet Take 650 mg by mouth daily as needed for pain.   Marland Kitchen AMBULATORY NON FORMULARY MEDICATION Wheelchair and Cushion  . aspirin EC 81 MG EC tablet Take 1 tablet (81 mg total) by mouth daily.  Marland Kitchen atorvastatin (LIPITOR) 10 MG tablet Take 1 tablet (10 mg total) by mouth daily at 6 PM.  . cetirizine (ZYRTEC) 10 MG tablet Take 1 tablet (10 mg total) by mouth daily. (Patient taking differently: Take 10 mg by mouth daily as needed for allergies. )  . clarithromycin (BIAXIN) 500 MG tablet Take 1 tablet (500 mg total) by mouth every 12 (twelve) hours.  . clopidogrel (PLAVIX) 75 MG tablet Take 1 tablet (75 mg total) by mouth daily with breakfast.  . doxycycline (VIBRAMYCIN) 50 MG capsule Take 2 capsules (100 mg total) by mouth 2 (two) times daily.  . feeding supplement, ENSURE ENLIVE, (ENSURE ENLIVE) LIQD Take 237 mLs by mouth 2 (  two) times daily between meals.  . gabapentin (NEURONTIN) 100 MG capsule Take 1 capsule (100 mg total) by mouth 3 (three) times daily.  . hydrALAZINE (APRESOLINE) 50 MG tablet Take one tablet by mouth four times daily for blood pressure (Patient taking differently: Take 50 mg by mouth 4 (four) times daily. )  . hydrALAZINE (APRESOLINE) 50 MG tablet Take 1 tablet (50 mg total) by mouth every 8 (eight) hours.   Marland Kitchen levofloxacin (LEVAQUIN) 750 MG tablet Take 1 tablet (750 mg total) by mouth every other day. X 6 doses (Patient not taking: Reported on 06/02/2015)  . Memantine HCl ER 28 MG CP24 Take 28 mg by mouth daily.  . metoprolol tartrate (LOPRESSOR) 25 MG tablet Take 0.5 tablets (12.5 mg total) by mouth 2 (two) times daily.  . metroNIDAZOLE (FLAGYL) 500 MG tablet Take 1 tablet (500 mg total) by mouth every 12 (twelve) hours.  . pantoprazole (PROTONIX) 40 MG tablet Take 1 tablet (40 mg total) by mouth 2 (two) times daily before a meal. (Patient taking differently: Take 40 mg by mouth 2 (two) times daily as needed (acid reflux). )  . pantoprazole sodium (PROTONIX) 40 mg/20 mL PACK Take 20 mLs (40 mg total) by mouth 2 (two) times daily.  . polyethylene glycol (MIRALAX / GLYCOLAX) packet Take 17 g by mouth daily.  Marland Kitchen saccharomyces boulardii (FLORASTOR) 250 MG capsule Take 1 capsule (250 mg total) by mouth 2 (two) times daily.  . Tdap (BOOSTRIX) 5-2.5-18.5 LF-MCG/0.5 injection Inject 0.5 mLs into the muscle once. (Patient not taking: Reported on 06/02/2015)  . traMADol (ULTRAM) 50 MG tablet Take 1 tablet (50 mg total) by mouth every 12 (twelve) hours as needed for moderate pain or severe pain.  . traMADol (ULTRAM) 50 MG tablet Take 1 tablet (50 mg total) by mouth every 6 (six) hours as needed for moderate pain or severe pain.   Allergies  Allergen Reactions  . Nsaids Other (See Comments)    Perforated gastric ulcer  . Amlodipine Besylate     REACTION: rash and itch  . Benzodiazepines Other (See Comments)    Hx of overuse/memory issue  . Penicillins   . Sulfonamide Derivatives    History Patient Active Problem List   Diagnosis Date Noted  . NSTEMI (non-ST elevated myocardial infarction)   . Dyspnea   . Wheezing   . Pleural effusion 05/14/2015  . Asthmatic bronchitis 05/14/2015  . Palliative care encounter   . Gastric ulcer with perforation s/p open omental patch 05/11/2015 05/11/2015  . Primary  osteoarthritis of both knees 06/02/2014  . Atopic dermatitis 02/10/2014  . Seasonal allergies 02/10/2014  . Alzheimer's disease 02/10/2014  . Osteoarthritis of both knees 12/06/2013  . Bilateral leg pain 06/03/2013  . CAP (community acquired pneumonia) 03/02/2013  . Normocytic anemia 03/01/2013  . Hyponatremia with generalized weakness, anorexia 02/28/2013  . Back pain 02/28/2013  . Anxiety and depression 02/28/2013  . Severe protein-calorie malnutrition 02/28/2013  . Hypokalemia 02/28/2013  . Cough 11/30/2012  . Insomnia 11/30/2012  . Dementia 06/17/2012  . Peripheral neuropathy 06/17/2012  . Degenerative joint disease 06/07/2011  . Encounter for long-term (current) use of high-risk medication 01/07/2011  . Preventative health care 01/04/2011  . KNEE PAIN, BILATERAL 06/01/2010  . ALLERGIC RHINITIS 02/27/2009  . WEIGHT LOSS 07/25/2008  . MENOPAUSAL DISORDER 04/18/2008  . DEPRESSION 09/03/2007  . ASTHMA 09/03/2007  . DIVERTICULOSIS, COLON 09/03/2007  . COLONIC POLYPS, HX OF 09/03/2007  . HYPERLIPIDEMIA 04/16/2007  . Acute blood loss anemia  04/16/2007  . Essential hypertension 04/16/2007  . GERD 04/16/2007  . LOW BACK PAIN 04/16/2007  . BREAST CANCER, HX OF 04/16/2007   Past Medical History  Diagnosis Date  . HYPERLIPIDEMIA 04/16/2007  . ANEMIA-NOS 04/16/2007  . ANXIETY 04/16/2007  . DEPRESSION 09/03/2007  . HYPERTENSION 04/16/2007  . ATHEROSLERO NATV ART EXTREM W/INTERMIT CLAUDICAT 06/05/2010  . ALLERGIC RHINITIS 02/27/2009  . PNEUMONIA, COMMUNITY ACQUIRED, PNEUMOCOCCAL 12/07/2008  . ASTHMA 09/03/2007  . GERD 04/16/2007  . DIVERTICULOSIS, COLON 09/03/2007  . MENOPAUSAL DISORDER 04/18/2008  . OSTEOARTHRITIS, KNEE, LEFT 11/24/2009  . JOINT EFFUSION, LEFT KNEE 02/27/2009  . KNEE PAIN, BILATERAL 06/01/2010  . LOW BACK PAIN 04/16/2007  . LEG PAIN, LEFT 06/01/2010  . WEIGHT LOSS 07/25/2008  . RASH-NONVESICULAR 07/25/2008  . Abdominal pain, generalized 09/03/2007  . BREAST CANCER, HX OF  04/16/2007  . COLONIC POLYPS, HX OF 09/03/2007  . Dementia 06/17/2012    mild  . Peripheral neuropathy 06/17/2012   Past Surgical History  Procedure Laterality Date  . Abdominal hysterectomy  1966  . Tubal ligation    . S/p left mastectomy  2011  . Laparoscopy N/A 05/11/2015    Procedure: LAPAROSCOPY DIAGNOSTIC converted open repair gastric ulcerand omental patch;  Surgeon: Leighton Ruff, MD;  Location: [facility];  Service: General;  Laterality: N/A;   Family History  Problem Relation Age of Onset  . Cancer Sister     Breast  . Hypertension Other   . Diabetes Other     1st degree relative  . Hypertension Maternal Grandmother     reports that she has never smoked. She has never used smokeless tobacco. She reports that she does not drink alcohol or use illicit drugs.  Basic Activities of Daily Living   ADLs Independent Needs Assistance Dependent  Bathing   X  Dressing   X  Ambulation   X  Toileting   X  Eating   X     Instrumental Activities of Daily Living  IADL Independent Needs Assistance Dependent  Cooking   X  Housework   X  Manage Medications   X  Manage the telephone   X  Shopping for food, clothes, Meds, etc   X  Use transportation   X  Manage Finances   X    Falls in the past six months:   no  Diet:  general  Nourishment: Protein-Calorie Malnutrition due to Inadequate oral intake  Nutritional Supplements:  Medpass: no Magic Cup:no  Prostat:no  Juven:no    Review of Systems  Patient has ability to communicate answers to ROS: yes See HPI  Geriatric Syndromes: Constipation yes ,   Incontinence yes  Dizziness no   Syncope no   Skin problems no   Visual Impairment yes   Hearing impairment yes  Eating impairment no  Impaired Memory or Cognition yes   Behavioral problems no   Sleep problems yes   Weight loss yes    Pain:  Pain Location: Back, left knee Pain Rating: She rates her pain as moderate.  Pain Duration: Unsure Pain Therapies: Tylenol  and Tramadol Pain Response to Therapies: Unsure Bowel Movement Difficulty: yes  Dyspnea: Dyspnea Rating: None Dyspnea Goal: none Dyspnea Therapies: N/a  Dyspnea Response to Therapies: N/a   General: Denies fevers, chills, progressive fatigue, weight gain.  Eyes: Denies pain, blurred vision  Ears/Nose/Throat: Denies ear pain, throat pain, rhinorrhea, nasal congestion.  Cardiovascular: Denies chest pains, palpitations, dyspnea on exertion, orthopnea, peripheral edema.  Respiratory: Denies cough, sputum,  dyspnea  Gastrointestinal: Denies abdominal pain, bloating, constipation, diarrhea.  Genitourinary: Denies dysuria, urinary frequency, discharge Musculoskeletal: Denies joint pain, swelling, weakness.  Skin: Denies skin rash or ulcers. Neurologic: Denies transient paralysis, weakness, paresthesias, headache.  Psychiatric: Denies depression, anxiety, psychosis. Endocrine: Denies weight loss   PHYSICAL EXAM:. Wt Readings from Last 3 Encounters:  06/02/15 99 lb 10.4 oz (45.2 kg)  05/12/15 117 lb 8.1 oz (53.3 kg)  12/06/13 111 lb 3.2 oz (50.44 kg)   Temp Readings from Last 3 Encounters:  06/08/15 98.1 F (36.7 C) Oral  05/22/15 100.4 F (38 C) Axillary  04/17/15 98.3 F (36.8 C) Oral   BP Readings from Last 3 Encounters:  06/08/15 108/46  05/22/15 133/68  04/17/15 128/62   Pulse Readings from Last 3 Encounters:  06/08/15 97  05/22/15 115  04/17/15 93    General: alert, cooperative, Thin, pleasant, clean, groomed HEENT:  No scleral icterus, no nasal secretions, Oromucosa moist and no erythema or lesion Neck:  Supple, No JVD, no lymphadenopathy CV:  RRR, harsh 3/6 systolic murmur heard best at lower left sternal border, PMI non-displaced, no ankle swelling but with some foot swelling RESP: No resp distress or accessory muscle use.  Clear to ausc bilat. No wheezing, no rales, no rhonchi ABD:  Soft, Non-tender, non-distended, +bowel sounds, no masses. Has a midline incision  that appears to be healing well with no erythema MSK:  No back pain, no joint pain. Left knee with medial tenderness and slight warmth with no erythema or noticeable effusion EXT: Warm and well perfused   edema 1+ in feet Gait:  Non ambulatory Skin: No specific lesions noted Ulcer Location: N/a Neurologic:Cranial nerves grossly intact. Muscle Tone within normal limits; Motor Strength: weakness of  upper and lower extremities at 4/5 Sensation: not tested; Cerebellar: no tremors noted; DTRs: 1+ bilaterally ;  Psych:  Orientation oriented to person; Judgment Poor, Insight Poor Memory global memory impairment noted; Attention Decreased;  Mood agitated; Speech normal; Language hearing disability ; Thought Coherent    MMSE - Mini Mental State Exam 06/12/2015 06/07/2013  Orientation to time 0 1  Orientation to Place 2 3  Registration 0 2  Attention/ Calculation 1 3  Recall 0 0  Language- name 2 objects 2 2  Language- repeat 0 1  Language- follow 3 step command 1 2  Language- read & follow direction 0 1  Write a sentence 0 0  Copy design 0 0  Total score 6 15   No flowsheet data found.  MiniCog: Not performed MMSE or MoCa:  6 / 30  Assessment and Plan:   See Problem List for individual problem's assessment and plans.   Family communications: Spoke with daughter, Elvin So. Discussed goals at SNF.    Advanced Directives (MOST form, Living Will, HCPOA): MOST Code Status:    DNR Intubation Status: DNI Intravenous Fluids:  Yes Feeding Tubes: Deferred Antibiotics: Yes Hospitalization: Yes Emergency contact:  [name]    Follow Up:  Next 3 days unless acute issues arise.

## 2015-06-08 NOTE — Progress Notes (Signed)
Pt for discharge to [facility].  CSW facilitated pt discharge needs including contacting facility, faxing pt discharge information via [facility], discussing with pt son via telephone, providing RN phone number to call report, and arranging ambulance transport for pt to [facility].  Pt Blue Medicare auth received yesterday is still valid.  No further social work needs identified at this time.  CSW signing off.   Alison Murray, MSW, [place] [facility] [phone]

## 2015-06-08 NOTE — Evaluation (Signed)
Clinical/Bedside Swallow Evaluation Patient Details  Name: Elizabeth Peterson MRN: [id number] Date of Birth: 1919/02/13  Today's Date: 06/08/2015 Time: SLP Start Time (ACUTE ONLY): 20 SLP Stop Time (ACUTE ONLY): 1047 SLP Time Calculation (min) (ACUTE ONLY): 11 min  Past Medical History:  Past Medical History  Diagnosis Date  . HYPERLIPIDEMIA 04/16/2007  . ANEMIA-NOS 04/16/2007  . ANXIETY 04/16/2007  . DEPRESSION 09/03/2007  . HYPERTENSION 04/16/2007  . ATHEROSLERO NATV ART EXTREM W/INTERMIT CLAUDICAT 06/05/2010  . ALLERGIC RHINITIS 02/27/2009  . PNEUMONIA, COMMUNITY ACQUIRED, PNEUMOCOCCAL 12/07/2008  . ASTHMA 09/03/2007  . GERD 04/16/2007  . DIVERTICULOSIS, COLON 09/03/2007  . MENOPAUSAL DISORDER 04/18/2008  . OSTEOARTHRITIS, KNEE, LEFT 11/24/2009  . JOINT EFFUSION, LEFT KNEE 02/27/2009  . KNEE PAIN, BILATERAL 06/01/2010  . LOW BACK PAIN 04/16/2007  . LEG PAIN, LEFT 06/01/2010  . WEIGHT LOSS 07/25/2008  . RASH-NONVESICULAR 07/25/2008  . Abdominal pain, generalized 09/03/2007  . BREAST CANCER, HX OF 04/16/2007  . COLONIC POLYPS, HX OF 09/03/2007  . Dementia 06/17/2012    mild  . Peripheral neuropathy 06/17/2012   Past Surgical History:  Past Surgical History  Procedure Laterality Date  . Abdominal hysterectomy  1966  . Tubal ligation    . S/p left mastectomy  2011  . Laparoscopy N/A 05/11/2015    Procedure: LAPAROSCOPY DIAGNOSTIC converted open repair gastric ulcerand omental patch;  Surgeon: Leighton Ruff, MD;  Location: [facility];  Service: General;  Laterality: N/A;   HPI:  81 year old female with PMHx of severe Alzheimer's dementia, hypertension, hyperlipidemia, recent perforated gastric ulcer repaired with an omental patch on 05/11/2015, anxiety and depression, osteoarthritis who brought to the ED by family after patient complained of chest pain and right leg pain; admitted 06/02/15 for NSTEMI   Assessment / Plan / Recommendation Clinical Impression  Pt appears to have a cognitively-based oral  dysphagia, with prolonged yet inadequate mastication of Dys 2 textures and ultimate oral expectoration despite prompting from SLP to swallow and utilization of liquid and pureed washes. She appears to tolerate purees and thin liquids well. Will adjust diet order to Dys 1 and thin liquids. Will follow briefly for tolerance.    Aspiration Risk  Mild    Diet Recommendation Dysphagia 1 (Puree);Thin   Medication Administration: Crushed with puree Compensations: Minimize environmental distractions;Slow rate;Small sips/bites;Other (Comment) (check for oral residue)    Other  Recommendations Oral Care Recommendations: Oral care BID   Follow Up Recommendations   tba    Frequency and Duration min 2x/week  1 week   Pertinent Vitals/Pain n/a    SLP Swallow Goals     Swallow Study Prior Functional Status       General Other Pertinent Information: 81 year old female with PMHx of severe Alzheimer's dementia, hypertension, hyperlipidemia, recent perforated gastric ulcer repaired with an omental patch on 05/11/2015, anxiety and depression, osteoarthritis who brought to the ED by family after patient complained of chest pain and right leg pain; admitted 06/02/15 for NSTEMI Type of Study: Bedside swallow evaluation Previous Swallow Assessment: BSE 05/18/15 recommending Dys 3 diet and thin liquids (of note, solids not observed as pt was pending CT abdoment) Diet Prior to this Study: Dysphagia 2 (chopped);Thin liquids Temperature Spikes Noted: No Respiratory Status: Room air History of Recent Intubation: No Behavior/Cognition: Alert;Cooperative;Requires cueing Oral Cavity - Dentition: Edentulous Self-Feeding Abilities: Needs assist Patient Positioning: Upright in bed Baseline Vocal Quality: Normal    Oral/Motor/Sensory Function Overall Oral Motor/Sensory Function: Appears within functional limits for tasks assessed   [employer]  chips: Not tested   Thin Liquid Thin Liquid: Within functional  limits Presentation: Straw    Nectar Thick Nectar Thick Liquid: Not tested   Honey Thick Honey Thick Liquid: Not tested   Puree Puree: Within functional limits Presentation: Spoon   Solid   Solid: Impaired Presentation: Spoon Oral Phase Impairments: Impaired mastication;Impaired anterior to posterior transit Oral Phase Functional Implications: Other (comment) (orally expectorates food)      Germain Osgood, M.A. CCC-SLP [phone]  Germain Osgood 06/08/2015,10:57 AM

## 2015-06-09 ENCOUNTER — Telehealth: Payer: Self-pay | Admitting: Family Medicine

## 2015-06-09 NOTE — Telephone Encounter (Signed)
Plan is for her to get rehab of 20-30 days. Daughter states patient lives at home with patient's sister, grand daughter and grand daughter's fiance. Patient is not ambulatory and is in a wheel chair when leaving the home, otherwise she is in a bed most of the day. I confirmed the patient is a DNR. We discussed Elizabeth Peterson's dementia. Per daughter, she has been steadily declining for the past few years. Namenda initially helped, but has not been helpful in about 2 years. Daughter confirmed that goal is for rehab from acute illness with eventual discharge back to home. All questions answered. No current concerns.

## 2015-06-10 ENCOUNTER — Emergency Department (HOSPITAL_COMMUNITY)
Admission: EM | Admit: 2015-06-10 | Discharge: 2015-06-24 | Disposition: E | Payer: Medicare Other | Attending: Emergency Medicine | Admitting: Emergency Medicine

## 2015-06-10 ENCOUNTER — Encounter (HOSPITAL_COMMUNITY): Payer: Self-pay | Admitting: *Deleted

## 2015-06-10 ENCOUNTER — Telehealth: Payer: Self-pay | Admitting: Family Medicine

## 2015-06-10 DIAGNOSIS — Z792 Long term (current) use of antibiotics: Secondary | ICD-10-CM | POA: Insufficient documentation

## 2015-06-10 DIAGNOSIS — Z862 Personal history of diseases of the blood and blood-forming organs and certain disorders involving the immune mechanism: Secondary | ICD-10-CM | POA: Diagnosis not present

## 2015-06-10 DIAGNOSIS — I10 Essential (primary) hypertension: Secondary | ICD-10-CM | POA: Diagnosis not present

## 2015-06-10 DIAGNOSIS — R55 Syncope and collapse: Secondary | ICD-10-CM | POA: Diagnosis present

## 2015-06-10 DIAGNOSIS — Z86010 Personal history of colonic polyps: Secondary | ICD-10-CM | POA: Diagnosis not present

## 2015-06-10 DIAGNOSIS — F419 Anxiety disorder, unspecified: Secondary | ICD-10-CM | POA: Insufficient documentation

## 2015-06-10 DIAGNOSIS — Z7902 Long term (current) use of antithrombotics/antiplatelets: Secondary | ICD-10-CM | POA: Insufficient documentation

## 2015-06-10 DIAGNOSIS — Z853 Personal history of malignant neoplasm of breast: Secondary | ICD-10-CM | POA: Diagnosis not present

## 2015-06-10 DIAGNOSIS — G629 Polyneuropathy, unspecified: Secondary | ICD-10-CM | POA: Insufficient documentation

## 2015-06-10 DIAGNOSIS — Z8742 Personal history of other diseases of the female genital tract: Secondary | ICD-10-CM | POA: Diagnosis not present

## 2015-06-10 DIAGNOSIS — Z79899 Other long term (current) drug therapy: Secondary | ICD-10-CM | POA: Diagnosis not present

## 2015-06-10 DIAGNOSIS — F0390 Unspecified dementia without behavioral disturbance: Secondary | ICD-10-CM | POA: Diagnosis not present

## 2015-06-10 DIAGNOSIS — E785 Hyperlipidemia, unspecified: Secondary | ICD-10-CM | POA: Diagnosis not present

## 2015-06-10 DIAGNOSIS — K219 Gastro-esophageal reflux disease without esophagitis: Secondary | ICD-10-CM | POA: Diagnosis not present

## 2015-06-10 DIAGNOSIS — Z880 Allergy status to penicillin: Secondary | ICD-10-CM | POA: Insufficient documentation

## 2015-06-10 DIAGNOSIS — F329 Major depressive disorder, single episode, unspecified: Secondary | ICD-10-CM | POA: Insufficient documentation

## 2015-06-10 DIAGNOSIS — I469 Cardiac arrest, cause unspecified: Secondary | ICD-10-CM | POA: Diagnosis not present

## 2015-06-10 DIAGNOSIS — J45909 Unspecified asthma, uncomplicated: Secondary | ICD-10-CM | POA: Insufficient documentation

## 2015-06-10 DIAGNOSIS — Z8701 Personal history of pneumonia (recurrent): Secondary | ICD-10-CM | POA: Insufficient documentation

## 2015-06-10 DIAGNOSIS — M1712 Unilateral primary osteoarthritis, left knee: Secondary | ICD-10-CM | POA: Insufficient documentation

## 2015-06-10 NOTE — ED Notes (Addendum)
Time of death 44

## 2015-06-10 NOTE — ED Notes (Signed)
Family remains at bedside and pt remains in room at this time.

## 2015-06-10 NOTE — Telephone Encounter (Signed)
Was made aware of pt being found unresponsive at around 18:00 in room at [facility]. Admitted there for SNF on 9/15. Found with pule 73bpm, shallow breaths 16/min, no pulse oximetry reading, BP 123/78, 97 degrees F axillary and CBG 29mg /dl. EMS dispatch started IV, gve dextrose and transported to [facility] ED. No further updates.   Ryan B. Bonner Puna, MD, PGY-3 06/03/2015 7:02 PM

## 2015-06-10 NOTE — ED Notes (Addendum)
Per ems pt from [facility], DNR, was found unresponsive, CBG was 29, unable to gain IV access. IO placed. When ems arrival, pt agonal breathing, HR 23, unable to get pulse ox. md at bedside, HR 0. Unable to find femoral pulse with doppler. Family at bedside

## 2015-06-10 NOTE — ED Provider Notes (Signed)
CSN: [id number]     Arrival date & time 06/20/2015  1850 History   None    Chief Complaint  Patient presents with  . unresponsive      (Consider location/radiation/quality/duration/timing/severity/associated sxs/prior Treatment) HPI Comments: Patient sent to the emergency department from nursing home. Patient was sent to the ER for unresponsiveness. EMS found the patient unresponsive to stimuli. She had a blood sugar of 29. She was administered dextrose and transport, during transport she started to exhibit agonal respirations followed by agonal heart rhythm. At arrival to the ER, patient completely unresponsive with no pulses. She is a DO NOT RESUSCITATE.   Past Medical History  Diagnosis Date  . HYPERLIPIDEMIA 04/16/2007  . ANEMIA-NOS 04/16/2007  . ANXIETY 04/16/2007  . DEPRESSION 09/03/2007  . HYPERTENSION 04/16/2007  . ATHEROSLERO NATV ART EXTREM W/INTERMIT CLAUDICAT 06/05/2010  . ALLERGIC RHINITIS 02/27/2009  . PNEUMONIA, COMMUNITY ACQUIRED, PNEUMOCOCCAL 12/07/2008  . ASTHMA 09/03/2007  . GERD 04/16/2007  . DIVERTICULOSIS, COLON 09/03/2007  . MENOPAUSAL DISORDER 04/18/2008  . OSTEOARTHRITIS, KNEE, LEFT 11/24/2009  . JOINT EFFUSION, LEFT KNEE 02/27/2009  . KNEE PAIN, BILATERAL 06/01/2010  . LOW BACK PAIN 04/16/2007  . LEG PAIN, LEFT 06/01/2010  . WEIGHT LOSS 07/25/2008  . RASH-NONVESICULAR 07/25/2008  . Abdominal pain, generalized 09/03/2007  . BREAST CANCER, HX OF 04/16/2007  . COLONIC POLYPS, HX OF 09/03/2007  . Dementia 06/17/2012    mild  . Peripheral neuropathy 06/17/2012   Past Surgical History  Procedure Laterality Date  . Abdominal hysterectomy  1966  . Tubal ligation    . S/p left mastectomy  2011  . Laparoscopy N/A 05/11/2015    Procedure: LAPAROSCOPY DIAGNOSTIC converted open repair gastric ulcerand omental patch;  Surgeon: Leighton Ruff, MD;  Location: [facility];  Service: General;  Laterality: N/A;   Family History  Problem Relation Age of Onset  . Cancer Sister     Breast  .  Hypertension Other   . Diabetes Other     1st degree relative  . Hypertension Maternal Grandmother    Social History  Substance Use Topics  . Smoking status: Never Smoker   . Smokeless tobacco: Never Used  . Alcohol Use: No   OB History    No data available     Review of Systems  Unable to perform ROS: Acuity of condition      Allergies  Nsaids; Amlodipine besylate; Benzodiazepines; Penicillins; and Sulfonamide derivatives  Home Medications   Prior to Admission medications   Medication Sig Start Date End Date Taking? Authorizing Provider  acetaminophen (TYLENOL) 500 MG tablet Take 1 tablet (500 mg total) by mouth every 8 (eight) hours. 06/08/15   Elmarie Shiley, MD  AMBULATORY NON FORMULARY MEDICATION Wheelchair and Cushion 10/18/14   Tiffany L Reed, DO  aspirin EC 81 MG EC tablet Take 1 tablet (81 mg total) by mouth daily. 06/08/15   Belkys A Regalado, MD  atorvastatin (LIPITOR) 10 MG tablet Take 1 tablet (10 mg total) by mouth daily at 6 PM. 06/08/15   Belkys A Regalado, MD  cetirizine (ZYRTEC) 10 MG tablet Take 1 tablet (10 mg total) by mouth daily. Patient taking differently: Take 10 mg by mouth daily as needed for allergies.  06/02/14   Tiffany L Reed, DO  clarithromycin (BIAXIN) 500 MG tablet Take 1 tablet (500 mg total) by mouth every 12 (twelve) hours. 06/08/15   Belkys A Regalado, MD  clopidogrel (PLAVIX) 75 MG tablet Take 1 tablet (75 mg total) by mouth daily  with breakfast. 06/08/15   Belkys A Regalado, MD  doxycycline (VIBRAMYCIN) 50 MG capsule Take 2 capsules (100 mg total) by mouth 2 (two) times daily. 06/08/15   Belkys A Regalado, MD  feeding supplement, ENSURE ENLIVE, (ENSURE ENLIVE) LIQD Take 237 mLs by mouth 2 (two) times daily between meals. 06/08/15   Belkys A Regalado, MD  gabapentin (NEURONTIN) 100 MG capsule Take 1 capsule (100 mg total) by mouth 3 (three) times daily. 06/17/12   Biagio Borg, MD  hydrALAZINE (APRESOLINE) 50 MG tablet Take 1 tablet (50 mg total)  by mouth every 8 (eight) hours. 06/08/15   Belkys A Regalado, MD  Memantine HCl ER 28 MG CP24 Take 28 mg by mouth daily. 04/18/14   Tiffany L Reed, DO  metoprolol tartrate (LOPRESSOR) 25 MG tablet Take 0.5 tablets (12.5 mg total) by mouth 2 (two) times daily. 06/08/15   Belkys A Regalado, MD  metroNIDAZOLE (FLAGYL) 500 MG tablet Take 1 tablet (500 mg total) by mouth every 12 (twelve) hours. 06/08/15   Belkys A Regalado, MD  pantoprazole sodium (PROTONIX) 40 mg/20 mL PACK Take 20 mLs (40 mg total) by mouth 2 (two) times daily. 06/08/15   Belkys A Regalado, MD  polyethylene glycol (MIRALAX / GLYCOLAX) packet Take 17 g by mouth daily. 06/08/15   Belkys A Regalado, MD  saccharomyces boulardii (FLORASTOR) 250 MG capsule Take 1 capsule (250 mg total) by mouth 2 (two) times daily. 06/08/15   Belkys A Regalado, MD  traMADol (ULTRAM) 50 MG tablet Take 1 tablet (50 mg total) by mouth every 6 (six) hours as needed for moderate pain or severe pain. 06/08/15   Belkys A Regalado, MD   Pulse 23  Resp 0 Physical Exam  Constitutional: She appears well-developed.  HENT:  Head: Normocephalic and atraumatic.  Neck: Neck supple.  Cardiovascular:  No cardiac rhythm auscultated  Pulmonary/Chest:  No spontaneous respirations  Abdominal: She exhibits no distension.  Musculoskeletal: She exhibits no edema.  Neurological:  Unresponsive    ED Course  Procedures (including critical care time) Labs Review Labs Reviewed - No data to display  Imaging Review No results found. I have personally reviewed and evaluated these images and lab results as part of my medical decision-making.   EKG Interpretation None      MDM   Final diagnoses:  None   cardiopulmonary arrest  Patient presents to the ER for evaluation of unresponsiveness. Patient was reportedly found unresponsive at the nursing home and transported to the ER. EMS report that she would never was conscious for them, but rapidly decompensated. She became  agonal and her respirations and then bradycardic and asystolic. Patient is a DO NOT RESUSCITATE. No interventions were provided. Family was present at the bedside. Patient declared dead.    Orpah Greek, MD 06/05/2015 Lurena Nida

## 2015-06-10 NOTE — ED Notes (Signed)
Bed: YE23 Expected date:  Expected time:  Means of arrival:  Comments: Ems- hypoglycemia

## 2015-06-10 NOTE — ED Notes (Addendum)
Pt arrived via EMS from [facility] [facility] with report of unresponsive on arrival, RR-18 and agonal, CBG-29, LBBB on monitor at rate of 80, I&O inserted and given D50 and NS of 76ml. No changes noted with interventions. Pt connected to monitor and asystole rhythm, no apical pulse, and no BP. Pt is a DNR. Pt pronounce death by Dr. Betsey Holiday at [phone].

## 2015-06-12 DIAGNOSIS — D72829 Elevated white blood cell count, unspecified: Secondary | ICD-10-CM | POA: Insufficient documentation

## 2015-06-12 DIAGNOSIS — K259 Gastric ulcer, unspecified as acute or chronic, without hemorrhage or perforation: Secondary | ICD-10-CM | POA: Insufficient documentation

## 2015-06-12 NOTE — Assessment & Plan Note (Signed)
Left knee slightly warm. Will obtain x-ray. No effusions or erythema.

## 2015-06-12 NOTE — Assessment & Plan Note (Signed)
Probably secondary to patient's overall cognitive decline. Will encourage eating. Will need to be fed if not eating on her own.

## 2015-06-12 NOTE — Assessment & Plan Note (Addendum)
Patient with recent ex lap for perforation repair with omental patch. Will need to confirm follow-up with surgery. Wound looks noninfected.

## 2015-06-12 NOTE — Progress Notes (Signed)
Patient ID: Elizabeth Peterson, female   DOB: 1918/10/30, 81 y.o.   MRN: [id number] I saw [name] with Dr Lonny Prude on Friday, 9/16.  I discussed case with Dr Lonny Prude and I agreed with his plan as documented in his admission note.

## 2015-06-12 NOTE — Assessment & Plan Note (Signed)
Recheck on 9/19

## 2015-06-12 NOTE — Assessment & Plan Note (Signed)
Patient is currently medically managed. Plan to obtain an EKG on 9/19 especially with patient being on macrolide.

## 2015-06-15 ENCOUNTER — Ambulatory Visit: Payer: Medicare Other | Admitting: Internal Medicine

## 2015-06-16 ENCOUNTER — Telehealth: Payer: Self-pay | Admitting: Family Medicine

## 2015-06-18 NOTE — Telephone Encounter (Signed)
Created in error

## 2015-06-24 DEATH — deceased

## 2016-08-10 IMAGING — DX DG CHEST 1V PORT
1 series · 1 of 1 positions shown · non-contrast
Comparison: 05/18/2015

CLINICAL DATA: Status post thoracentesis right side. History of
asthma, hypertension, pneumonia, left mastectomy.

EXAM:
PORTABLE CHEST - 1 VIEW

[chest ap]
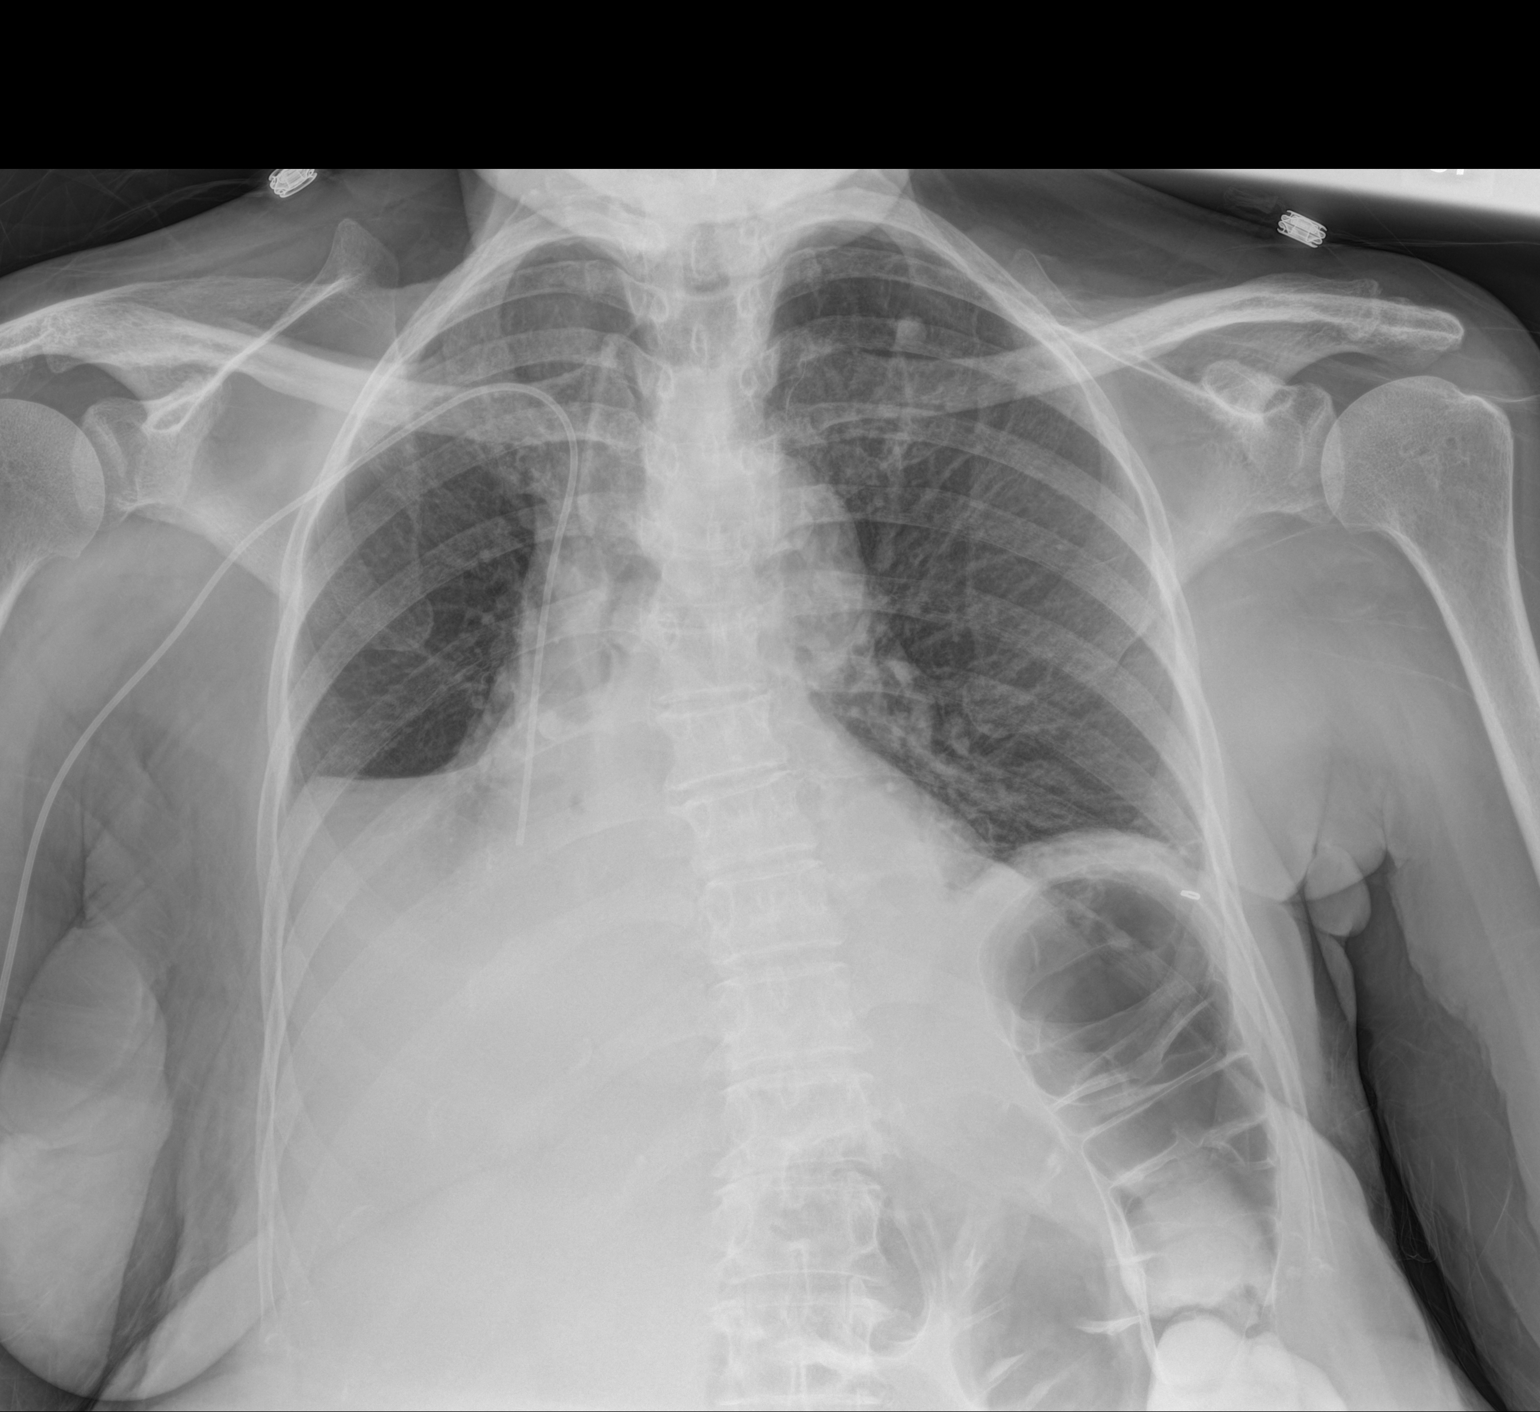

[1 of 1 positions shown; findings below may reference images not displayed]

FINDINGS: Right-sided PICC line tip overlies the level of the lower superior
vena cava, unchanged. There continues be dense opacification at the
right lung base obscuring the hemidiaphragm. Horizontal level of the
pleural effusion raises the question of air within the pleural space
although no definite pneumothorax can be identified. There is
persistent opacity in the left lung base as well. EKG lead overlies
the left lung apex. Residual contrast is seen the colon.
IMPRESSION: 1. Status post right pneumothorax scratched a status post right
thoracentesis.
2. Question of small right pneumothorax.  Follow-up is recommended.
The salient findings were discussed with IMARA DHIMAN on 05/19/2015
at [DATE].

## 2016-08-10 IMAGING — CR DG CHEST 1V PORT
1 series · 1 of 1 positions shown · non-contrast
Comparison: 05/19/2015 and prior exams

CLINICAL DATA: [AGE] female with right pleural effusion
-status post thoracentesis.

EXAM:
PORTABLE CHEST - 1 VIEW

[AP]
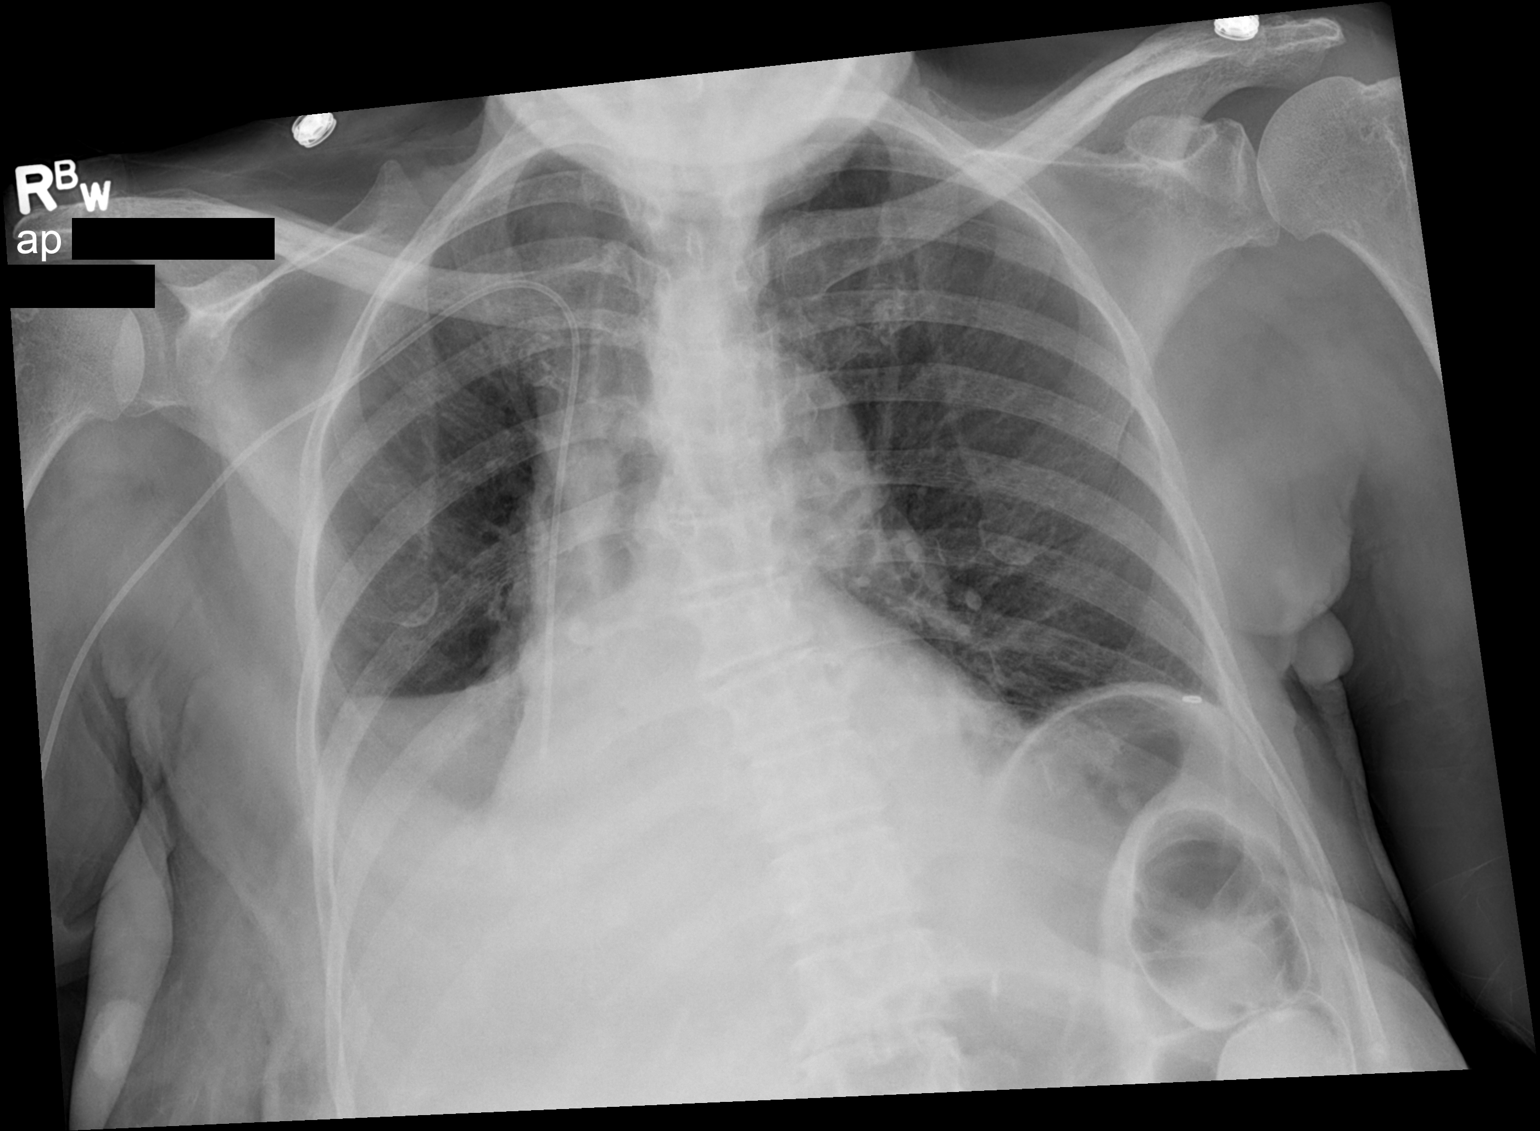

[1 of 1 positions shown; findings below may reference images not displayed]

FINDINGS: Right lower lung atelectasis and probable right pleural effusion
noted.

There is no evidence of pneumothorax.

Cardiomegaly and right PICC line with tip overlying the superior
cavoatrial junction again noted.

Mild left basilar atelectasis is noted.
IMPRESSION: Little significant change in appearance of the chest. No evidence of
pneumothorax identified on the study.

Bilateral lower lung atelectasis, right greater than left, and
probable right pleural effusion again noted.

## 2016-08-24 IMAGING — CR DG HIP (WITH OR WITHOUT PELVIS) 2-3V*R*
3 series · 3 of 3 positions shown · non-contrast
Comparison: None.

CLINICAL DATA: Pain.  No known trauma.

EXAM:
DG HIP (WITH OR WITHOUT PELVIS) 2-3V RIGHT

[x pelvis]
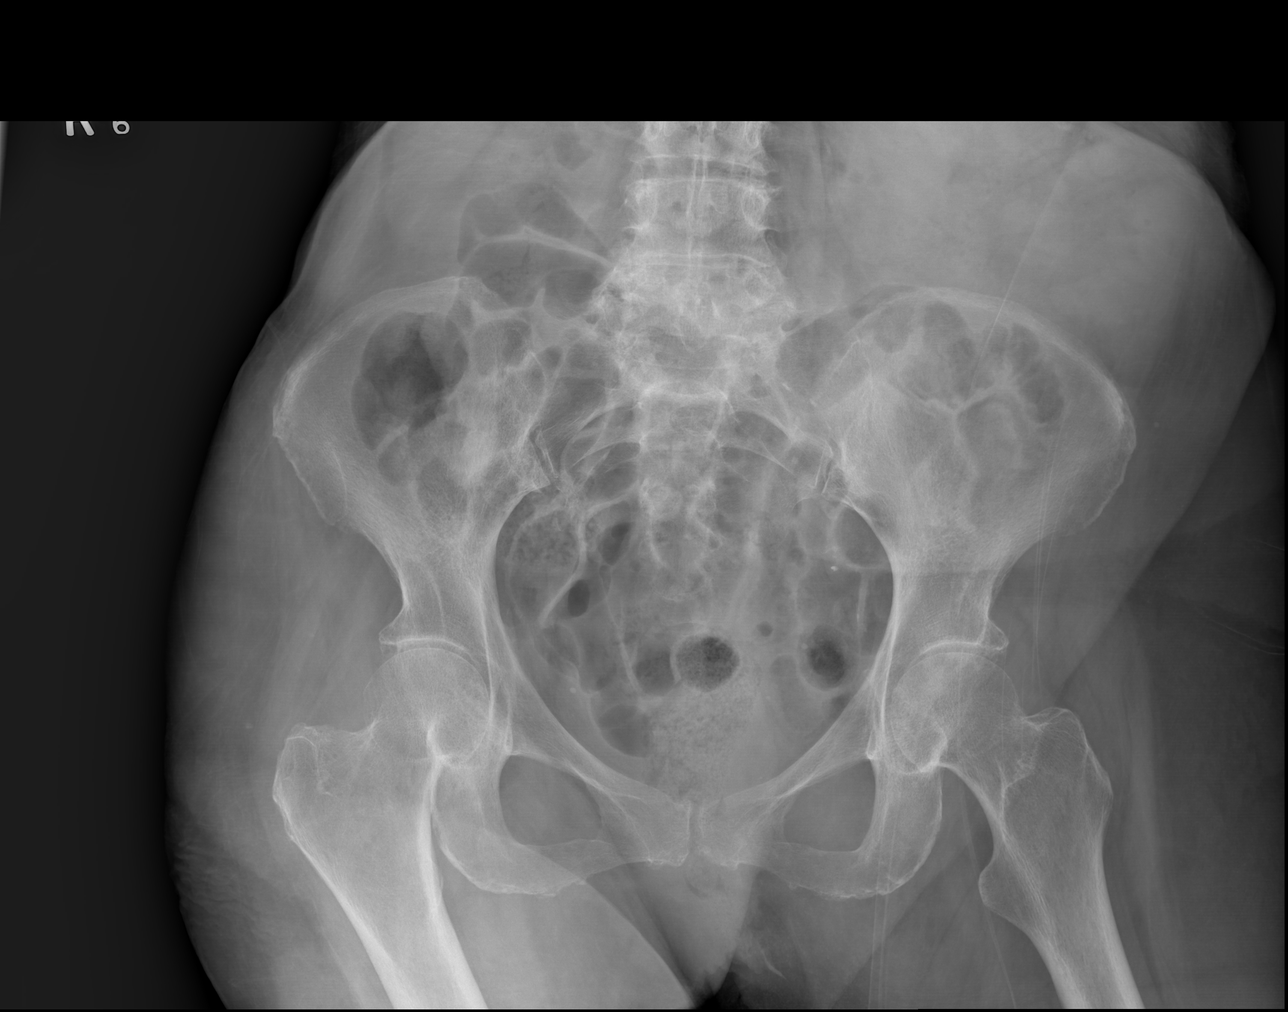

[x hip ap right]
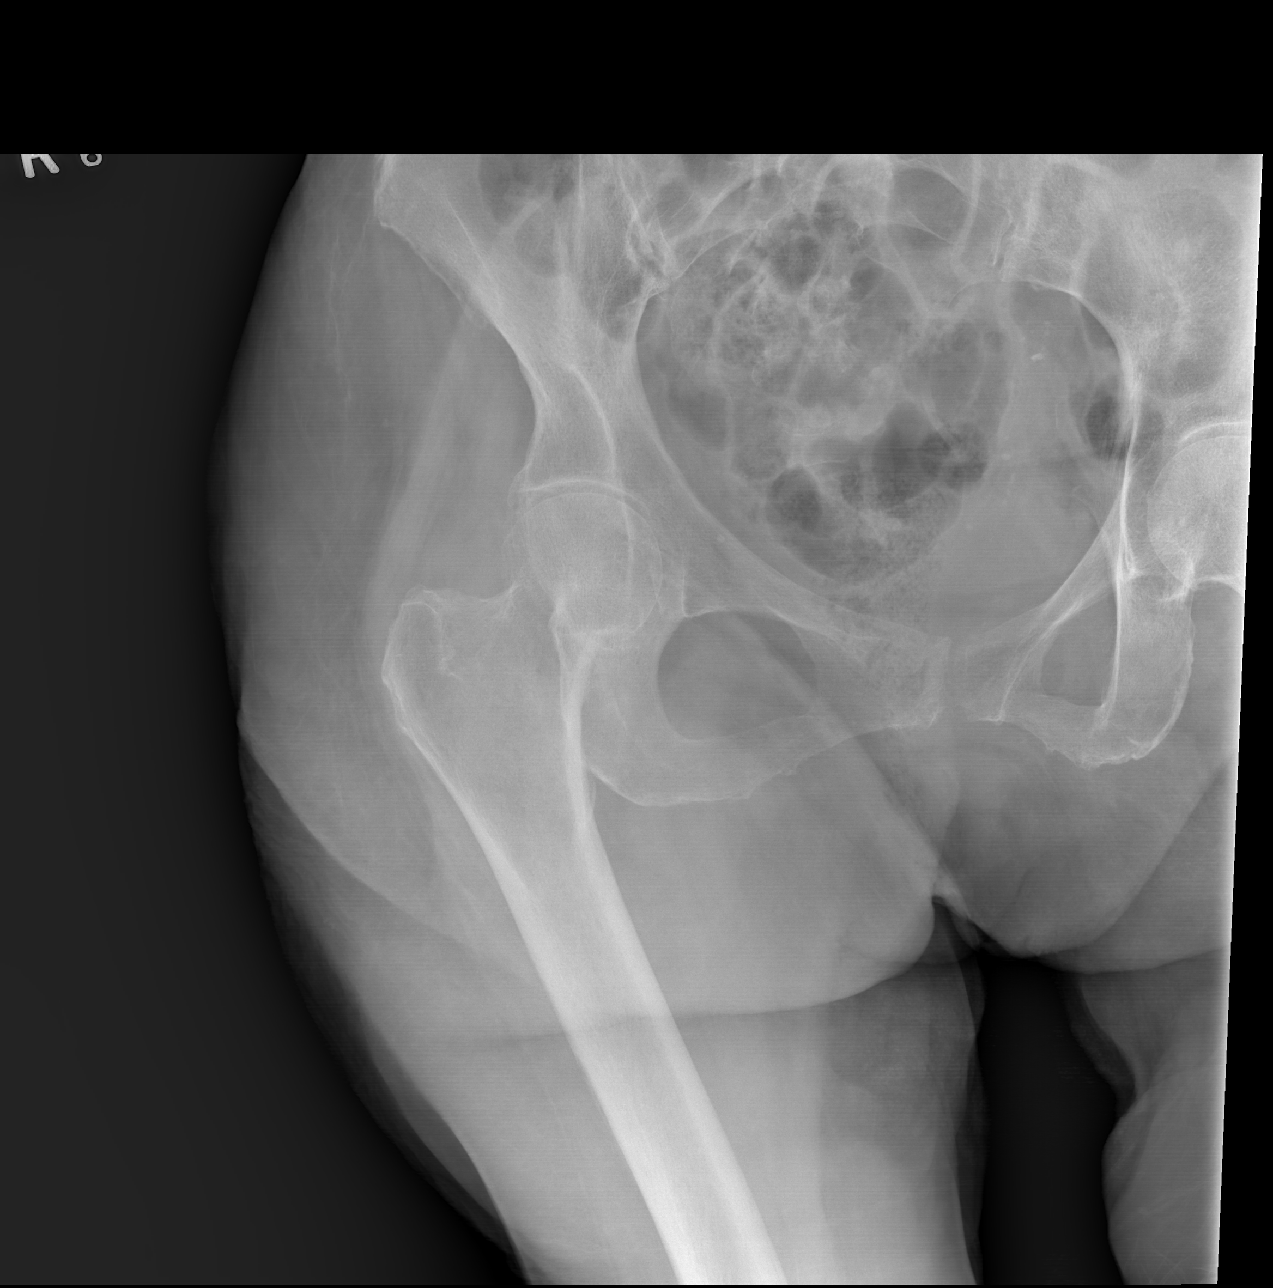

[w hip lat right]
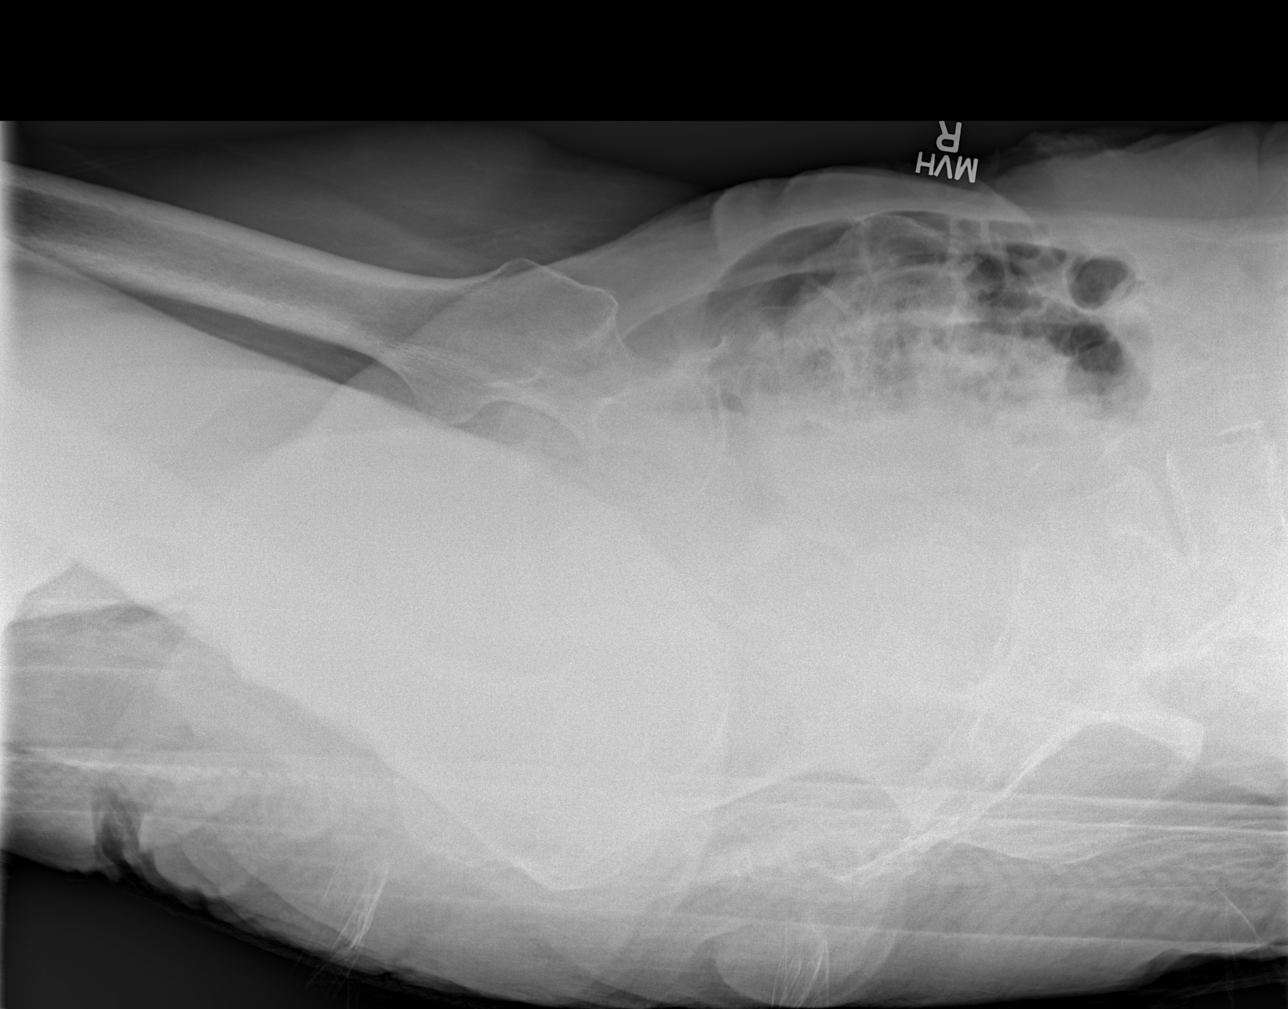

[3 of 3 positions shown; findings below may reference images not displayed]

FINDINGS: Frontal pelvis as well as frontal and lateral right hip images
obtained. There is moderate narrowing of both hip joints. No acute
fracture or dislocation. No erosive change.
IMPRESSION: Narrowing both hip joints.  No fracture or dislocation.
